# Patient Record
Sex: Female | Born: 1965 | Race: White | Hispanic: No | Marital: Married | Smoking: Never smoker
Health system: Southern US, Community
[De-identification: ages and names within clinical notes are randomized; demographics above are authoritative.]

## PROBLEM LIST (undated history)

## (undated) DIAGNOSIS — G4730 Sleep apnea, unspecified: Secondary | ICD-10-CM

## (undated) DIAGNOSIS — C569 Malignant neoplasm of unspecified ovary: Secondary | ICD-10-CM

## (undated) DIAGNOSIS — T7840XA Allergy, unspecified, initial encounter: Secondary | ICD-10-CM

## (undated) DIAGNOSIS — I509 Heart failure, unspecified: Secondary | ICD-10-CM

## (undated) DIAGNOSIS — I10 Essential (primary) hypertension: Secondary | ICD-10-CM

## (undated) DIAGNOSIS — N159 Renal tubulo-interstitial disease, unspecified: Secondary | ICD-10-CM

## (undated) HISTORY — PX: ABDOMINAL HYSTERECTOMY: SHX81

## (undated) HISTORY — DX: Essential (primary) hypertension: I10

## (undated) HISTORY — DX: Renal tubulo-interstitial disease, unspecified: N15.9

## (undated) HISTORY — DX: Malignant neoplasm of unspecified ovary: C56.9

## (undated) HISTORY — DX: Sleep apnea, unspecified: G47.30

## (undated) HISTORY — DX: Heart failure, unspecified: I50.9

## (undated) HISTORY — PX: EYE SURGERY: SHX253

## (undated) HISTORY — DX: Allergy, unspecified, initial encounter: T78.40XA

---

## 1986-11-28 HISTORY — PX: SHOULDER OPEN ROTATOR CUFF REPAIR: SHX2407

## 2000-04-03 ENCOUNTER — Other Ambulatory Visit: Admission: RE | Admit: 2000-04-03 | Discharge: 2000-04-03 | Payer: Self-pay | Admitting: *Deleted

## 2002-06-04 ENCOUNTER — Encounter: Admission: RE | Admit: 2002-06-04 | Discharge: 2002-09-02 | Payer: Self-pay | Admitting: Internal Medicine

## 2003-01-23 ENCOUNTER — Other Ambulatory Visit: Admission: RE | Admit: 2003-01-23 | Discharge: 2003-01-23 | Payer: Self-pay | Admitting: *Deleted

## 2005-07-26 ENCOUNTER — Other Ambulatory Visit: Admission: RE | Admit: 2005-07-26 | Discharge: 2005-07-26 | Payer: Self-pay | Admitting: Obstetrics & Gynecology

## 2009-06-30 ENCOUNTER — Encounter: Admission: RE | Admit: 2009-06-30 | Discharge: 2009-09-28 | Payer: Self-pay | Admitting: Obstetrics and Gynecology

## 2011-05-12 ENCOUNTER — Encounter (INDEPENDENT_AMBULATORY_CARE_PROVIDER_SITE_OTHER): Payer: Self-pay | Admitting: Surgery

## 2013-07-08 ENCOUNTER — Encounter: Payer: BC Managed Care – PPO | Attending: Internal Medicine | Admitting: Dietician

## 2013-07-08 ENCOUNTER — Ambulatory Visit: Payer: Self-pay | Admitting: Dietician

## 2013-07-08 ENCOUNTER — Encounter: Payer: Self-pay | Admitting: Dietician

## 2013-07-08 ENCOUNTER — Ambulatory Visit: Payer: Self-pay | Admitting: *Deleted

## 2013-07-08 VITALS — Ht 65.0 in | Wt 215.3 lb

## 2013-07-08 DIAGNOSIS — E119 Type 2 diabetes mellitus without complications: Secondary | ICD-10-CM | POA: Insufficient documentation

## 2013-07-08 DIAGNOSIS — Z713 Dietary counseling and surveillance: Secondary | ICD-10-CM | POA: Insufficient documentation

## 2013-07-08 NOTE — Progress Notes (Signed)
Appt start time: 1600 end time:  1715.   Assessment:  Patient was seen on  07/08/2013 for individual diabetes education. Markeita stated that she was interested in seeking some help to managed her blood sugar better. Reports that she has not been testing her blood sugar but can feel that it is fluctuating greatly and going low at times. Lives with her husband who was at the appointment with her today.   Previously had a very stressful job but is currently not working and feels like her blood sugar is going down now.   Anselmo Rod a glucose meter and instruction on how to test her sugar and recommended keeping a log which she can show the doctor in case an adjustment to medicine needs to be made.   Current HbA1c: 7.8 (02/2013)  MEDICATIONS: See list   DIETARY INTAKE:  24-hr recall:  B ( AM): oatmeal with coffee and sweetened creamer, or unsweetened cereal with skim milk , or English Muffins with margarine sometimes apple jelly sometimes with a diet drink  Snk ( AM): yogurt or boost glucose controlled shake (may have for breakfast), sometimes skipping  L ( PM): 1/2 ham sandwich with mustard, celery and carrots, 5-6 chips, and diet drink or peanut butter and jelly and sugar free bread Snk ( PM): boost glucose controlled shake or  gogurt  D ( PM): hot dog, hamburger, spaghetti, drive through, typically grilled chicken when have time, vegetable, greens, pork and beans, macaroni and cheese with diet drink or unsweet tea Snk ( PM): sometimes gogurt or peanut butter and crackers   Beverages: occassionally mini soda if sugar is low, usually diet drink, water, unsweet tea  Usual physical activity: 10 minute walk most days  Estimated energy needs: 1800 calories 200 g carbohydrates 135 g protein 50 g fat  Progress Towards Goal(s):  In progress.   Nutritional Diagnosis:  [place]-2.1 Impaired nutrition utilization As related to glucose metabolism. As evidenced by Hgb A1c 7.8% and doctor  recommendation for DSME.    Intervention:  Nutrition counseling provided.  Discussed diabetes disease process and treatment options.  Discussed physiology of diabetes and role of obesity on insulin resistance.    Encouraged moderate weight reduction to improve glucose levels.  Discussed role of medications and diet in glucose control  Provided education on macronutrients on glucose levels.  Provided education on carb counting, importance of regularly scheduled meals/snacks, and meal planning  Described short-term complications: hyper- and hypo-glycemia.  Discussed causes,symptoms, and treatment options.  Discussed role of stress on blood glucose levels and discussed strategies to manage psychosocial issues.  Not discussed d/t time constraints. Will address at follow-up:  Discussed prevention, detection, and treatment of long-term complications. Discussed the role of prolonged elevated glucose levels on body systems.  Discussed recommendations for long-term diabetes self-care. Established checklist for medical, dental, and emotional self-care.  Encouraged moderate weight reduction to improve glucose levels. Discussed role of medications and diet in glucose control  Discussed effects of physical activity on glucose levels and long-term glucose control. Recommended 150 minutes of physical activity/week. Reviewed patient medications.  Discussed role of medication on blood glucose and possible side effects Discussed blood glucose monitoring and interpretation.  Discussed recommended target ranges and individual ranges.     Handouts given during visit include:  Living With Diabetes Book  MyPlate Handout  16X CHO Snacks  Barriers to learning/adherance to lifestyle change: none  Diabetes self-care support plan:   [facility] support group  Husband and sons  Monitoring/Evaluation:  Dietary intake, exercise, test blood sugar, and body weight in 4 week(s).

## 2013-08-05 ENCOUNTER — Ambulatory Visit: Payer: BC Managed Care – PPO | Admitting: Dietician

## 2014-06-23 ENCOUNTER — Other Ambulatory Visit: Payer: Self-pay | Admitting: Nurse Practitioner

## 2014-06-23 ENCOUNTER — Ambulatory Visit
Admission: RE | Admit: 2014-06-23 | Discharge: 2014-06-23 | Disposition: A | Discharge status: Home / Self Care | Payer: BC Managed Care – PPO | Source: Ambulatory Visit | Attending: Nurse Practitioner | Admitting: Nurse Practitioner

## 2014-06-23 DIAGNOSIS — M25559 Pain in unspecified hip: Secondary | ICD-10-CM

## 2014-06-23 DIAGNOSIS — M25551 Pain in right hip: Secondary | ICD-10-CM

## 2016-03-23 ENCOUNTER — Other Ambulatory Visit: Payer: Self-pay | Admitting: Nurse Practitioner

## 2016-03-23 ENCOUNTER — Ambulatory Visit
Admission: RE | Admit: 2016-03-23 | Discharge: 2016-03-23 | Disposition: A | Discharge status: Home / Self Care | Payer: Managed Care, Other (non HMO) | Source: Ambulatory Visit | Attending: Nurse Practitioner | Admitting: Nurse Practitioner

## 2016-03-23 DIAGNOSIS — S90852A Superficial foreign body, left foot, initial encounter: Secondary | ICD-10-CM

## 2016-09-06 ENCOUNTER — Encounter (HOSPITAL_COMMUNITY): Payer: Self-pay | Admitting: Emergency Medicine

## 2016-09-06 ENCOUNTER — Emergency Department (HOSPITAL_COMMUNITY)
Admission: EM | Admit: 2016-09-06 | Discharge: 2016-09-07 | Disposition: A | Discharge status: Home / Self Care | Payer: Managed Care, Other (non HMO) | Attending: Emergency Medicine | Admitting: Emergency Medicine

## 2016-09-06 DIAGNOSIS — Z79899 Other long term (current) drug therapy: Secondary | ICD-10-CM | POA: Diagnosis not present

## 2016-09-06 DIAGNOSIS — I10 Essential (primary) hypertension: Secondary | ICD-10-CM | POA: Diagnosis not present

## 2016-09-06 DIAGNOSIS — E119 Type 2 diabetes mellitus without complications: Secondary | ICD-10-CM | POA: Diagnosis not present

## 2016-09-06 DIAGNOSIS — Z794 Long term (current) use of insulin: Secondary | ICD-10-CM | POA: Insufficient documentation

## 2016-09-06 DIAGNOSIS — Z91040 Latex allergy status: Secondary | ICD-10-CM | POA: Insufficient documentation

## 2016-09-06 DIAGNOSIS — Z7984 Long term (current) use of oral hypoglycemic drugs: Secondary | ICD-10-CM | POA: Insufficient documentation

## 2016-09-06 DIAGNOSIS — R109 Unspecified abdominal pain: Secondary | ICD-10-CM | POA: Diagnosis present

## 2016-09-06 DIAGNOSIS — N10 Acute tubulo-interstitial nephritis: Secondary | ICD-10-CM | POA: Insufficient documentation

## 2016-09-06 LAB — URINE MICROSCOPIC-ADD ON

## 2016-09-06 LAB — I-STAT CG4 LACTIC ACID, ED: Lactic Acid, Venous: 1.86 mmol/L (ref 0.5–1.9)

## 2016-09-06 LAB — COMPREHENSIVE METABOLIC PANEL
ALBUMIN: 3.5 g/dL (ref 3.5–5.0)
ALT: 25 U/L (ref 14–54)
AST: 27 U/L (ref 15–41)
Alkaline Phosphatase: 114 U/L (ref 38–126)
Anion gap: 15 (ref 5–15)
BUN: 35 mg/dL — AB (ref 6–20)
CO2: 19 mmol/L — AB (ref 22–32)
Calcium: 8.9 mg/dL (ref 8.9–10.3)
Chloride: 92 mmol/L — ABNORMAL LOW (ref 101–111)
Creatinine, Ser: 1.34 mg/dL — ABNORMAL HIGH (ref 0.44–1.00)
GFR calc Af Amer: 52 mL/min — ABNORMAL LOW (ref >60)
GFR calc non Af Amer: 45 mL/min — ABNORMAL LOW (ref >60)
GLUCOSE: 206 mg/dL — AB (ref 65–99)
POTASSIUM: 3.8 mmol/L (ref 3.5–5.1)
Sodium: 126 mmol/L — ABNORMAL LOW (ref 135–145)
Total Bilirubin: 0.7 mg/dL (ref 0.3–1.2)
Total Protein: 7.9 g/dL (ref 6.5–8.1)

## 2016-09-06 LAB — URINALYSIS, ROUTINE W REFLEX MICROSCOPIC
Bilirubin Urine: NEGATIVE
Ketones, ur: 40 mg/dL — AB
Nitrite: NEGATIVE
PH: 6 (ref 5.0–8.0)
Protein, ur: 100 mg/dL — AB
Specific Gravity, Urine: 1.026 (ref 1.005–1.030)

## 2016-09-06 LAB — CBC
HEMATOCRIT: 38.2 % (ref 36.0–46.0)
Hemoglobin: 13.4 g/dL (ref 12.0–15.0)
MCH: 29.4 pg (ref 26.0–34.0)
MCHC: 35.1 g/dL (ref 30.0–36.0)
MCV: 83.8 fL (ref 78.0–100.0)
Platelets: 198 10*3/uL (ref 150–400)
RBC: 4.56 MIL/uL (ref 3.87–5.11)
RDW: 12.8 % (ref 11.5–15.5)
WBC: 11.5 10*3/uL — AB (ref 4.0–10.5)

## 2016-09-06 LAB — LIPASE, BLOOD: LIPASE: 29 U/L (ref 11–51)

## 2016-09-06 MED ORDER — CIPROFLOXACIN HCL 500 MG PO TABS: 500.0000 mg | ORAL_TABLET | Freq: Two times a day (BID) | ORAL | 0 refills | Status: DC

## 2016-09-06 MED ORDER — DEXTROSE 5 % IV SOLN
1.0000 g | Freq: Once | INTRAVENOUS | Status: AC
  Administered 2016-09-06: 1 g via INTRAVENOUS
  Filled 2016-09-06: qty 10

## 2016-09-06 MED ORDER — ACETAMINOPHEN 325 MG PO TABS
650.0000 mg | ORAL_TABLET | Freq: Once | ORAL | Status: AC
  Administered 2016-09-06: 650 mg via ORAL
  Filled 2016-09-06: qty 2

## 2016-09-06 MED ORDER — SODIUM CHLORIDE 0.9 % IV BOLUS (SEPSIS)
1000.0000 mL | Freq: Once | INTRAVENOUS | Status: AC
  Administered 2016-09-06: 1000 mL via INTRAVENOUS

## 2016-09-06 MED ORDER — ONDANSETRON HCL 4 MG/2ML IJ SOLN
4.0000 mg | Freq: Once | INTRAMUSCULAR | Status: AC
  Administered 2016-09-06: 4 mg via INTRAVENOUS
  Filled 2016-09-06: qty 2

## 2016-09-06 MED ORDER — ONDANSETRON 4 MG PO TBDP
4.0000 mg | ORAL_TABLET | Freq: Once | ORAL | Status: AC | PRN
  Administered 2016-09-06: 4 mg via ORAL
  Filled 2016-09-06: qty 1

## 2016-09-06 MED ORDER — OXYCODONE-ACETAMINOPHEN 5-325 MG PO TABS: 1.0000 | ORAL_TABLET | Freq: Four times a day (QID) | ORAL | 0 refills | Status: DC | PRN

## 2016-09-06 MED ORDER — HYDROMORPHONE HCL 1 MG/ML IJ SOLN
1.0000 mg | Freq: Once | INTRAMUSCULAR | Status: AC
  Administered 2016-09-06: 1 mg via INTRAVENOUS
  Filled 2016-09-06: qty 1

## 2016-09-06 MED ORDER — ACETAMINOPHEN 325 MG PO TABS
650.0000 mg | ORAL_TABLET | Freq: Once | ORAL | Status: AC | PRN
  Administered 2016-09-06: 650 mg via ORAL
  Filled 2016-09-06: qty 2

## 2016-09-06 MED ORDER — ONDANSETRON 4 MG PO TBDP: 4.0000 mg | ORAL_TABLET | Freq: Three times a day (TID) | ORAL | 0 refills | Status: DC | PRN

## 2016-09-06 NOTE — Discharge Instructions (Signed)
Please read and follow all provided instructions.  Your diagnoses today include:  1. Acute pyelonephritis    Tests performed today include:  Urine test - suggests that you have an infection in your bladder  Blood counts and electrolytes - slightly high infection fighting cell count  Urine culture - pending  Vital signs. See below for your results today.   Medications prescribed:   Ciprofloxacin - antibiotic  You have been prescribed an antibiotic medicine: take the entire course of medicine even if you are feeling better. Stopping early can cause the antibiotic not to work.   Percocet (oxycodone/acetaminophen) - narcotic pain medication  DO NOT drive or perform any activities that require you to be awake and alert because this medicine can make you drowsy. BE VERY CAREFUL not to take multiple medicines containing Tylenol (also called acetaminophen). Doing so can lead to an overdose which can damage your liver and cause liver failure and possibly death.   Zofran (ondansetron) - for nausea and vomiting  Home care instructions:  Follow any educational materials contained in this packet.  Follow-up instructions: Please follow-up with your primary care provider in 2 days for recheck of your symptoms.  Return instructions:   Please return to the Emergency Department if you experience worsening symptoms.   Return with fever, worsening pain, persistent vomiting, worsening pain in your back.   Please return if you have any other emergent concerns.  Additional Information:  Your vital signs today were: BP 111/56 (BP Location: Right Arm)    Pulse 109    Temp 98.6 F (37 C) (Axillary)    Resp 20    Ht 5\' 6"  (1.676 m)    Wt 97.5 kg    SpO2 96%    BMI 34.70 kg/m  If your blood pressure (BP) was elevated above 135/85 this visit, please have this repeated by your doctor within one month. --------------

## 2016-09-06 NOTE — ED Triage Notes (Signed)
Patient reports dx with UTI 1 month ago. States she started getting sick again Friday. Complaining of bilateral flank pain and lower abdominal pain. Patient is on a new diabetic medication and states she thinks these symptoms might be due to that.

## 2016-09-06 NOTE — ED Provider Notes (Signed)
[facility] DEPT Provider Note   CSN: [id number] Arrival date & time: 09/06/16  1708   By signing my name below, I, Avnee Patel, attest that this documentation has been prepared under the direction and in the presence of  [employer]. Electronically Signed: Delton Prairie, ED Scribe. 09/06/16. 8:30 PM.   History   Chief Complaint CC: fever, vomiting, flank pain  The history is provided by the patient. No language interpreter was used.   HPI Comments:  Renee Howell is a 50 y.o. female, with a hx of DM, h/o pyelo treated with cipro a month ago, who presents to the Emergency Department complaining of gradual onset moderate right sided flank pain which began 5 days ago. Associated symptoms include fatigue, nausea, vomiting, fevers. Pt notes she visited her PCP x 5 day and was told she has hematuria. Pt notes her symptoms may be related to her new DM medications. She states she has felt similar symptoms every time she continues to take this DM medication. No alleviating factors noted.   Past Medical History:  Diagnosis Date  . Allergy   . Diabetes mellitus   . Hypertension   . Kidney infection     There are no active problems to display for this patient.   Past Surgical History:  Procedure Laterality Date  . ABDOMINAL HYSTERECTOMY    . CESAREAN SECTION    . EYE SURGERY    . SHOULDER OPEN ROTATOR CUFF REPAIR  1988    OB History    No data available       Home Medications    Prior to Admission medications   Medication Sig Start Date End Date Taking? Authorizing Provider  Calcium Citrate-Vitamin D (CITRACAL + D PO) Take 500 mg by mouth 2 (two) times daily.      Historical Provider, MD  citalopram (CELEXA) 20 MG tablet Take 20 mg by mouth daily.      Historical Provider, MD  glyBURIDE (DIABETA) 5 MG tablet Take 5 mg by mouth daily with breakfast.      Historical Provider, MD  ibuprofen (ADVIL,MOTRIN) 100 MG tablet Take 100 mg by mouth every 4 (four) hours as  needed.      Historical Provider, MD  metFORMIN (GLUMETZA) 1000 MG (MOD) 24 hr tablet Take 1,000 mg by mouth 2 (two) times daily with a meal.      Historical Provider, MD  Multiple Vitamins-Minerals (MULTIVITAMIN WITH MINERALS) tablet Take 1 tablet by mouth daily.      Historical Provider, MD  nystatin-triamcinolone (MYCOLOG) ointment Apply topically as directed.      Historical Provider, MD  sitaGLIPtin (JANUVIA) 100 MG tablet Take 100 mg by mouth daily.    Historical Provider, MD    Family History Family History  Problem Relation Age of Onset  . Cancer Mother   . Diabetes Father     Social History Social History  Substance Use Topics  . Smoking status: Never Smoker  . Smokeless tobacco: Never Used  . Alcohol use Yes     Allergies   Latex   Review of Systems Review of Systems  Constitutional: Positive for fatigue and fever.  HENT: Negative for rhinorrhea and sore throat.   Eyes: Negative for redness.  Respiratory: Negative for cough.   Cardiovascular: Negative for chest pain.  Gastrointestinal: Positive for nausea and vomiting. Negative for abdominal pain and diarrhea.  Genitourinary: Positive for flank pain. Negative for dysuria.  Musculoskeletal: Positive for myalgias.  Skin: Negative for rash.  Neurological: Negative for headaches.     Physical Exam Updated Vital Signs BP 105/80 (BP Location: Left Arm)   Pulse 119   Temp 102.3 F (39.1 C) (Oral)   Resp 16   Ht 5\' 6"  (1.676 m)   Wt 215 lb (97.5 kg)   SpO2 95%   BMI 34.70 kg/m   Physical Exam  Constitutional: She appears well-developed and well-nourished. She appears distressed.  HENT:  Head: Normocephalic and atraumatic.  Eyes: Conjunctivae are normal. Right eye exhibits no discharge. Left eye exhibits no discharge.  Neck: Normal range of motion. Neck supple.  Cardiovascular: Regular rhythm and normal heart sounds.  Tachycardia present.   Pulmonary/Chest: Effort normal and breath sounds normal. No  respiratory distress. She has no wheezes. She has no rales.  Abdominal: Soft. She exhibits no distension. There is no tenderness. There is CVA tenderness (Right).  Neurological: She is alert.  Skin: Skin is warm and dry.  Psychiatric: She has a normal mood and affect.  Nursing note and vitals reviewed.    ED Treatments / Results  DIAGNOSTIC STUDIES:  Oxygen Saturation is 95% on RA, normal by my interpretation.    COORDINATION OF CARE:  8:19 PM Will give pt a fluid bolus and Zofran. Discussed treatment plan with pt at bedside and pt agreed to plan.  Labs (all labs ordered are listed, but only abnormal results are displayed) Labs Reviewed  COMPREHENSIVE METABOLIC PANEL - Abnormal; Notable for the following:       Result Value   Sodium 126 (*)    Chloride 92 (*)    CO2 19 (*)    Glucose, Bld 206 (*)    BUN 35 (*)    Creatinine, Ser 1.34 (*)    GFR calc non Af Amer 45 (*)    GFR calc Af Amer 52 (*)    All other components within normal limits  CBC - Abnormal; Notable for the following:    WBC 11.5 (*)    All other components within normal limits  URINALYSIS, ROUTINE W REFLEX MICROSCOPIC (NOT AT [facility]) - Abnormal; Notable for the following:    APPearance CLOUDY (*)    Glucose, UA >1000 (*)    Hgb urine dipstick SMALL (*)    Ketones, ur 40 (*)    Protein, ur 100 (*)    Leukocytes, UA TRACE (*)    All other components within normal limits  URINE MICROSCOPIC-ADD ON - Abnormal; Notable for the following:    Squamous Epithelial / LPF 0-5 (*)    Bacteria, UA MANY (*)    All other components within normal limits  URINE CULTURE  LIPASE, BLOOD  I-STAT CG4 LACTIC ACID, ED   Procedures Procedures (including critical care time)  Medications Ordered in ED Medications  acetaminophen (TYLENOL) tablet 650 mg (650 mg Oral Given 09/06/16 1806)  ondansetron (ZOFRAN-ODT) disintegrating tablet 4 mg (4 mg Oral Given 09/06/16 1806)  HYDROmorphone (DILAUDID) injection 1 mg (1 mg  Intravenous Given 09/06/16 2109)  ondansetron (ZOFRAN) injection 4 mg (4 mg Intravenous Given 09/06/16 2109)  sodium chloride 0.9 % bolus 1,000 mL (0 mLs Intravenous Stopped 09/06/16 2211)  cefTRIAXone (ROCEPHIN) 1 g in dextrose 5 % 50 mL IVPB (0 g Intravenous Stopped 09/06/16 2224)  acetaminophen (TYLENOL) tablet 650 mg (650 mg Oral Given 09/06/16 2210)  sodium chloride 0.9 % bolus 1,000 mL (0 mLs Intravenous Stopped 09/06/16 2331)  HYDROmorphone (DILAUDID) injection 1 mg (1 mg Intravenous Given 09/06/16 2211)     Initial  Impression / Assessment and Plan / ED Course  I have reviewed the triage vital signs and the nursing notes.  Pertinent labs & imaging results that were available during my care of the patient were reviewed by me and considered in my medical decision making (see chart for details).  Clinical Course   Symptoms consistent with pyelo. She was hydrated with 2L NS. Pain was controlled and she was drinking fluids. Heart rate improved from 120s into the 100s. Fever improved.   Discussed and offered admission versus discharge to home with close PCP follow-up. Patient states that she is doing much better. Husband involved with decision at bedside. Patient will like to be discharged this time. Will discharged home with Cipro, Percocet, Zofran. Encouraged patient to follow-up with her primary care physician in the next 2 days for recheck. Encourage return with worsening symptoms including high persistent fever, persistent vomiting and unable to take her medications, uncontrolled pain. Patient has husband verbalized understanding and agrees with plan.  Final Clinical Impressions(s) / ED Diagnoses   Final diagnoses:  Acute pyelonephritis   Patient with signs and symptoms of acute pyelonephritis, likely right-sided. Patient febrile and tachycardic upon arrival. Treated as above with clinical improvement. Patient feeling much better and like to be discharged. She is tolerating fluids. No  signs of DKA. Feel that outpatient trial is warranted. Return instructions as above. Low suspicion for ureteral colic.   New Prescriptions Discharge Medication List as of 09/06/2016 11:53 PM    START taking these medications   Details  ciprofloxacin (CIPRO) 500 MG tablet Take 1 tablet (500 mg total) by mouth 2 (two) times daily., Starting Tue 09/06/2016, Print    ondansetron (ZOFRAN ODT) 4 MG disintegrating tablet Take 1 tablet (4 mg total) by mouth every 8 (eight) hours as needed for nausea or vomiting., Starting Tue 09/06/2016, Print    oxyCODONE-acetaminophen (PERCOCET/ROXICET) 5-325 MG tablet Take 1-2 tablets by mouth every 6 (six) hours as needed for severe pain., Starting Tue 09/06/2016, Print      I personally performed the services described in this documentation, which was scribed in my presence. The recorded information has been reviewed and is accurate.     Carlisle Cater, PA-C 09/07/16 [id number]    Gareth Morgan, MD 09/11/16 [id number]

## 2016-09-07 NOTE — ED Notes (Signed)
Pt ambulatory and independent at discharge.  Verbalized understanding of discharge instructions 

## 2016-09-09 ENCOUNTER — Emergency Department (HOSPITAL_COMMUNITY): Payer: Managed Care, Other (non HMO)

## 2016-09-09 ENCOUNTER — Emergency Department (HOSPITAL_COMMUNITY)
Admission: EM | Admit: 2016-09-09 | Discharge: 2016-09-09 | Disposition: A | Discharge status: Home / Self Care | Payer: Managed Care, Other (non HMO) | Attending: Emergency Medicine | Admitting: Emergency Medicine

## 2016-09-09 ENCOUNTER — Encounter (HOSPITAL_COMMUNITY): Payer: Self-pay | Admitting: *Deleted

## 2016-09-09 DIAGNOSIS — Z7984 Long term (current) use of oral hypoglycemic drugs: Secondary | ICD-10-CM | POA: Diagnosis not present

## 2016-09-09 DIAGNOSIS — R109 Unspecified abdominal pain: Secondary | ICD-10-CM | POA: Insufficient documentation

## 2016-09-09 DIAGNOSIS — E119 Type 2 diabetes mellitus without complications: Secondary | ICD-10-CM | POA: Insufficient documentation

## 2016-09-09 DIAGNOSIS — I10 Essential (primary) hypertension: Secondary | ICD-10-CM | POA: Insufficient documentation

## 2016-09-09 DIAGNOSIS — Z791 Long term (current) use of non-steroidal anti-inflammatories (NSAID): Secondary | ICD-10-CM | POA: Insufficient documentation

## 2016-09-09 DIAGNOSIS — R1084 Generalized abdominal pain: Secondary | ICD-10-CM

## 2016-09-09 DIAGNOSIS — Z87440 Personal history of urinary (tract) infections: Secondary | ICD-10-CM

## 2016-09-09 DIAGNOSIS — Z79899 Other long term (current) drug therapy: Secondary | ICD-10-CM | POA: Insufficient documentation

## 2016-09-09 LAB — BASIC METABOLIC PANEL
Anion gap: 10 (ref 5–15)
BUN: 13 mg/dL (ref 6–20)
CO2: 22 mmol/L (ref 22–32)
Calcium: 8.8 mg/dL — ABNORMAL LOW (ref 8.9–10.3)
Chloride: 106 mmol/L (ref 101–111)
Creatinine, Ser: 0.85 mg/dL (ref 0.44–1.00)
GFR calc Af Amer: >60 mL/min (ref >60)
GFR calc non Af Amer: >60 mL/min (ref >60)
Glucose, Bld: 76 mg/dL (ref 65–99)
Potassium: 3.2 mmol/L — ABNORMAL LOW (ref 3.5–5.1)
Sodium: 138 mmol/L (ref 135–145)

## 2016-09-09 LAB — URINALYSIS, ROUTINE W REFLEX MICROSCOPIC
BILIRUBIN URINE: NEGATIVE
Glucose, UA: >1000 mg/dL — AB
Ketones, ur: NEGATIVE mg/dL
Leukocytes, UA: NEGATIVE
NITRITE: NEGATIVE
Protein, ur: NEGATIVE mg/dL
Specific Gravity, Urine: 1.021 (ref 1.005–1.030)
pH: 6 (ref 5.0–8.0)

## 2016-09-09 LAB — URINE MICROSCOPIC-ADD ON

## 2016-09-09 LAB — CBC WITH DIFFERENTIAL/PLATELET
Basophils Absolute: 0 10*3/uL (ref 0.0–0.1)
Basophils Relative: 0 %
Eosinophils Absolute: 0 10*3/uL (ref 0.0–0.7)
Eosinophils Relative: 0 %
HCT: 37.7 % (ref 36.0–46.0)
Hemoglobin: 13.1 g/dL (ref 12.0–15.0)
Lymphocytes Relative: 19 %
Lymphs Abs: 2.6 10*3/uL (ref 0.7–4.0)
MCH: 29.5 pg (ref 26.0–34.0)
MCHC: 34.7 g/dL (ref 30.0–36.0)
MCV: 84.9 fL (ref 78.0–100.0)
Monocytes Absolute: 1.3 10*3/uL — ABNORMAL HIGH (ref 0.1–1.0)
Monocytes Relative: 10 %
Neutro Abs: 9.6 10*3/uL — ABNORMAL HIGH (ref 1.7–7.7)
Neutrophils Relative %: 71 %
Platelets: 298 10*3/uL (ref 150–400)
RBC: 4.44 MIL/uL (ref 3.87–5.11)
RDW: 13.7 % (ref 11.5–15.5)
WBC: 13.5 10*3/uL — ABNORMAL HIGH (ref 4.0–10.5)

## 2016-09-09 LAB — HEPATIC FUNCTION PANEL
ALT: 62 U/L — ABNORMAL HIGH (ref 14–54)
AST: 43 U/L — ABNORMAL HIGH (ref 15–41)
Albumin: 3.1 g/dL — ABNORMAL LOW (ref 3.5–5.0)
Alkaline Phosphatase: 170 U/L — ABNORMAL HIGH (ref 38–126)
Bilirubin, Direct: 0.1 mg/dL (ref 0.1–0.5)
Indirect Bilirubin: 0.6 mg/dL (ref 0.3–0.9)
Total Bilirubin: 0.7 mg/dL (ref 0.3–1.2)
Total Protein: 7.5 g/dL (ref 6.5–8.1)

## 2016-09-09 LAB — URINE CULTURE: Culture: >=100000 — AB

## 2016-09-09 LAB — I-STAT CG4 LACTIC ACID, ED: Lactic Acid, Venous: 1.01 mmol/L (ref 0.5–1.9)

## 2016-09-09 MED ORDER — IOPAMIDOL (ISOVUE-300) INJECTION 61%
75.0000 mL | Freq: Once | INTRAVENOUS | Status: AC | PRN
  Administered 2016-09-09: 75 mL via INTRAVENOUS

## 2016-09-09 MED ORDER — SODIUM CHLORIDE 0.9 % IV BOLUS (SEPSIS)
1000.0000 mL | Freq: Once | INTRAVENOUS | Status: AC
  Administered 2016-09-09: 1000 mL via INTRAVENOUS

## 2016-09-09 MED ORDER — MORPHINE SULFATE (PF) 4 MG/ML IV SOLN
4.0000 mg | Freq: Once | INTRAVENOUS | Status: AC
Start: 2016-09-09 — End: 2016-09-09
  Administered 2016-09-09: 4 mg via INTRAVENOUS
  Filled 2016-09-09: qty 1

## 2016-09-09 MED ORDER — FLUCONAZOLE 150 MG PO TABS: 150.0000 mg | ORAL_TABLET | Freq: Once | ORAL | 0 refills | Status: AC

## 2016-09-09 MED ORDER — IOPAMIDOL (ISOVUE-370) INJECTION 76%: 75.0000 mL | Freq: Once | INTRAVENOUS | Status: DC | PRN

## 2016-09-09 MED ORDER — ONDANSETRON 4 MG PO TBDP
4.0000 mg | ORAL_TABLET | Freq: Once | ORAL | Status: AC | PRN
  Administered 2016-09-09: 4 mg via ORAL
  Filled 2016-09-09: qty 1

## 2016-09-09 NOTE — ED Provider Notes (Signed)
[facility] DEPT Provider Note   CSN: [id number] Arrival date & time: 09/09/16  1349     History   Chief Complaint Chief Complaint  Patient presents with  . Flank Pain  . Fatigue    HPI Renee Howell is a 50 y.o. female.  HPI   50 year old female presents today with complaints of abdominal pain. Patient has a past medical history of diabetes and recent pyelonephritis. One month ago she was treated with Cipro, notes approximately 7 days ago she started having moderate right-sided flank pain. She reports nausea vomiting and subjective fevers at home. Patient originally thought this was due to her diabetic medications after seeing her primary care provider. Patient was seen in the emergency room 2 days ago had urinalysis consistent with urinary tract infection and right-sided flank pain. Patient was diagnosed with pyelonephritis. She was discharged home and reports that her symptoms return. She reports she is having more abdominal pain now, diffuse, with very little flank pain at this time. She denies any significant dysuria, reports that she continues to be nauseous and vomiting. Patient notes that she hadn't had a bowel movement in 3 days, had to use an enema last night and had a painful bowel movement. She notes since then she's been passing significant gas.   Past Medical History:  Diagnosis Date  . Allergy   . Diabetes mellitus   . Hypertension   . Kidney infection     There are no active problems to display for this patient.   Past Surgical History:  Procedure Laterality Date  . ABDOMINAL HYSTERECTOMY    . CESAREAN SECTION    . EYE SURGERY    . SHOULDER OPEN ROTATOR CUFF REPAIR  1988    OB History    No data available       Home Medications    Prior to Admission medications   Medication Sig Start Date End Date Taking? Authorizing Provider  acidophilus (RISAQUAD) CAPS capsule Take 1 capsule by mouth daily.   Yes Historical Provider, MD    amphetamine-dextroamphetamine (ADDERALL XR) 20 MG 24 hr capsule Take 20 mg by mouth daily.   Yes Historical Provider, MD  amphetamine-dextroamphetamine (ADDERALL) 10 MG tablet Take 10 mg by mouth daily with breakfast.   Yes Historical Provider, MD  canagliflozin (INVOKANA) 100 MG TABS tablet Take 100 mg by mouth daily before breakfast.   Yes Historical Provider, MD  cholecalciferol (VITAMIN D) 1000 units tablet Take 2,000 Units by mouth daily.   Yes Historical Provider, MD  ciprofloxacin (CIPRO) 500 MG tablet Take 1 tablet (500 mg total) by mouth 2 (two) times daily. 09/06/16  Yes Carlisle Cater, PA-C  Dulaglutide (TRULICITY [place]) Inject 1 each into the skin every 7 (seven) days.   Yes Historical Provider, MD  DULoxetine (CYMBALTA) 30 MG capsule Take 90 mg by mouth daily.   Yes Historical Provider, MD  glyBURIDE (DIABETA) 5 MG tablet Take 5 mg by mouth 2 (two) times daily.    Yes Historical Provider, MD  ibuprofen (ADVIL,MOTRIN) 100 MG tablet Take 100 mg by mouth every 4 (four) hours as needed for pain.    Yes Historical Provider, MD  insulin detemir (LEVEMIR) 100 UNIT/ML injection Inject 15 Units into the skin 2 (two) times daily.   Yes Historical Provider, MD  metFORMIN (GLUCOPHAGE) 1000 MG tablet Take 1,000 mg by mouth 2 (two) times daily.   Yes Historical Provider, MD  ondansetron (ZOFRAN ODT) 4 MG disintegrating tablet Take 1 tablet (4 mg total)  by mouth every 8 (eight) hours as needed for nausea or vomiting. 09/06/16  Yes Carlisle Cater, PA-C  oxyCODONE-acetaminophen (PERCOCET/ROXICET) 5-325 MG tablet Take 1-2 tablets by mouth every 6 (six) hours as needed for severe pain. Patient not taking: Reported on 09/09/2016 09/06/16   Carlisle Cater, PA-C    Family History Family History  Problem Relation Age of Onset  . Cancer Mother   . Diabetes Father     Social History Social History  Substance Use Topics  . Smoking status: Never Smoker  . Smokeless tobacco: Never Used  . Alcohol use Yes      Allergies   Trulicity [dulaglutide] and Latex   Review of Systems Review of Systems  All other systems reviewed and are negative.  Physical Exam Updated Vital Signs BP 140/74 (BP Location: Right Arm)   Pulse 113   Temp 98.2 F (36.8 C) (Oral)   Resp 18   SpO2 100%   Physical Exam  Constitutional: She is oriented to person, place, and time. She appears well-developed and well-nourished.  HENT:  Head: Normocephalic and atraumatic.  Eyes: Conjunctivae are normal. Pupils are equal, round, and reactive to light. Right eye exhibits no discharge. Left eye exhibits no discharge. No scleral icterus.  Neck: Normal range of motion. No JVD present. No tracheal deviation present.  Pulmonary/Chest: Effort normal. No stridor.  Abdominal: Soft. Bowel sounds are normal. She exhibits no distension and no mass. There is tenderness. There is no rebound and no guarding.  RUQ TTP, mild generalized abd TTP  Neurological: She is alert and oriented to person, place, and time. Coordination normal.  Psychiatric: She has a normal mood and affect. Her behavior is normal. Judgment and thought content normal.  Nursing note and vitals reviewed.    ED Treatments / Results  Labs (all labs ordered are listed, but only abnormal results are displayed) Labs Reviewed  URINALYSIS, ROUTINE W REFLEX MICROSCOPIC (NOT AT [facility]) - Abnormal; Notable for the following:       Result Value   Glucose, UA >1000 (*)    Hgb urine dipstick TRACE (*)    All other components within normal limits  URINE MICROSCOPIC-ADD ON - Abnormal; Notable for the following:    Squamous Epithelial / LPF 0-5 (*)    Bacteria, UA RARE (*)    All other components within normal limits  CBC WITH DIFFERENTIAL/PLATELET - Abnormal; Notable for the following:    WBC 13.5 (*)    Neutro Abs 9.6 (*)    Monocytes Absolute 1.3 (*)    All other components within normal limits  BASIC METABOLIC PANEL - Abnormal; Notable for the following:     Potassium 3.2 (*)    Calcium 8.8 (*)    All other components within normal limits  HEPATIC FUNCTION PANEL  I-STAT CG4 LACTIC ACID, ED    EKG  EKG Interpretation None       Radiology Ct Abdomen Pelvis W Contrast  Result Date: 09/09/2016 CLINICAL DATA:  Lower abdominal pain EXAM: CT ABDOMEN AND PELVIS WITH CONTRAST TECHNIQUE: Multidetector CT imaging of the abdomen and pelvis was performed using the standard protocol following bolus administration of intravenous contrast. CONTRAST:  55mL ISOVUE-300 IOPAMIDOL (ISOVUE-300) INJECTION 61% COMPARISON:  None. FINDINGS: Lower chest: Small patchy area of atelectasis or infiltrate right medial lung base, incompletely evaluated. Lung bases otherwise clear. Hepatobiliary: Liver normal in size and contour. No focal liver lesion. Gallbladder and bile ducts normal. Pancreas: Negative Spleen: Negative Adrenals/Urinary Tract: Normal renal excretion of  contrast. No mass or obstruction. No urinary tract calculi. Urinary bladder normal. Stomach/Bowel: Negative for bowel obstruction. No bowel mass or edema. Normal appendix. Moderate stool in the colon. Negative for diverticulitis. Vascular/Lymphatic: Negative Reproductive: Hysterectomy.  No pelvic mass. Other: No free fluid.  Small umbilical hernia containing fat. Musculoskeletal: Negative IMPRESSION: Small area of atelectasis or infiltrate right medial lung base Constipation. Negative for acute abnormality in the abdomen. Negative for diverticulitis or appendicitis. Electronically Signed   By: Franchot Gallo M.D.   On: 09/09/2016 21:15    Procedures Procedures (including critical care time)  Medications Ordered in ED Medications  sodium chloride 0.9 % bolus 1,000 mL (not administered)  iopamidol (ISOVUE-370) 76 % injection 75 mL (not administered)  ondansetron (ZOFRAN-ODT) disintegrating tablet 4 mg (4 mg Oral Given 09/09/16 1612)  morphine 4 MG/ML injection 4 mg (4 mg Intravenous Given 09/09/16 2008)    iopamidol (ISOVUE-300) 61 % injection 75 mL (75 mLs Intravenous Contrast Given 09/09/16 2051)     Initial Impression / Assessment and Plan / ED Course  I have reviewed the triage vital signs and the nursing notes.  Pertinent labs & imaging results that were available during my care of the patient were reviewed by me and considered in my medical decision making (see chart for details).  Clinical Course     Final Clinical Impressions(s) / ED Diagnoses   Final diagnoses:  Generalized abdominal pain    Labs: CBC, BMP, urinalysis  Imaging: CT abdomen and pelvis with contrast  Consults:  Therapeutics:  Discharge Meds:   Assessment/Plan:  50 year old female presents today with complaints of abdominal pain. Patient originally had urinary tract infection diagnosed with pyelonephritis. Symptoms have migrated to the abdomen, very minimal right flank pain, urinalysis not consistent with UTI. Patient will require CT abdomen and pelvis due to significant pain.  Initial labs show elevated white blood cells  Pt care signed out to oncoming provider pending  CT, reevaluation and disposition.     New Prescriptions New Prescriptions   No medications on file     Okey Regal, PA-C 09/09/16 2124    Isla Pence, MD 09/10/16 1807

## 2016-09-09 NOTE — ED Triage Notes (Signed)
Pt complains of fatigue, bilateral flank pain. Pt was seen at [facility] 2 days ago and diagnosed with pyelonephritis, sent home with cipro and told to follow up with PCP. Pt spoke to PCP over phone today, who recommended pt come to ED. Pt also complains of red patchiness to her lower legs bilateral since last night.

## 2016-09-09 NOTE — ED Provider Notes (Signed)
11:30 PM Patient care assumed from [facility] PA-C at change of shift. Patient presented 3 days ago for complaints of flank pain. She was diagnosed with a urinary tract infection at this time. She returned to the ED for worsening right flank pain. On my assessment of the patient, she states that she feels much better.  CT abdomen pelvis was ordered by Hedges, PA-C, given persistent discomfort. I have reviewed these findings which are significant for constipation. No acute process in the abdomen or pelvis. Patient does have some mild findings in her medial right lower lobe concerning for early infiltrate versus atelectasis. I have a low suspicion for pneumonia at this time as patient is afebrile and without respiratory symptoms. She denies any shortness of breath. No pleuritic type chest pain. She has no hyperglycemia or DKA to suggest acute infection. She does have a leukocytosis; however, this is stable. Lactic acid level is normal.  Patient has been advised to continue her antibiotics and follow-up with her doctor on Monday for repeat evaluation. I have discussed reasons to return to the emergency department. The patient verbalizes understanding. Return precautions provided as well. Patient discharged in stable condition with no unaddressed concerns.   Antonietta Breach, PA-C 09/10/16 [id number]    Isla Pence, MD 09/10/16 [phone]

## 2016-09-09 NOTE — ED Notes (Signed)
Patient transported to X-ray 

## 2016-09-09 NOTE — Discharge Instructions (Signed)
Your CT scan showed constipation. We recommend that you continue taking MiraLAX twice a day for this. Your CT also showed possibility of very early pneumonia, though we believe it is more likely that this is something called atelectasis which can be a normal finding on CT scan and resolves on its own in a few days. You may take your home pain medications as needed. Continue your antibiotics until completed. You may take Diflucan upon completion of your antibiotic course. Follow-up with your doctor on Monday or Tuesday regarding your visit today. You may return for any new or concerning symptoms.

## 2016-09-09 NOTE — ED Notes (Signed)
About to draw blood for I-stat lactic, pt.'s husband requested phlebotomist to draw the blood instead of this Nurse. Mini lab notified.

## 2016-09-10 ENCOUNTER — Telehealth (HOSPITAL_BASED_OUTPATIENT_CLINIC_OR_DEPARTMENT_OTHER): Payer: Self-pay

## 2016-09-10 NOTE — Telephone Encounter (Signed)
Post ED Visit - Positive Culture Follow-up  Culture report reviewed by antimicrobial stewardship pharmacist:  []  Elenor Quinones, Pharm.D. []  Heide Guile, Pharm.D., BCPS []  Parks Neptune, Pharm.D. []  Alycia Rossetti, Pharm.D., BCPS []  [place], Pharm.D., BCPS, AAHIVP []  Legrand Como, Pharm.D., BCPS, AAHIVP []  Milus Glazier, Pharm.D. []  Rob Evette Doffing, Pharm.D. [facility] D Positive urine culture Treated with Ciprofloxacin, organism sensitive to the same and no further patient follow-up is required at this time.  Genia Del 09/10/2016, 12:17 PM

## 2016-12-07 ENCOUNTER — Encounter (HOSPITAL_BASED_OUTPATIENT_CLINIC_OR_DEPARTMENT_OTHER): Payer: Self-pay

## 2016-12-07 ENCOUNTER — Inpatient Hospital Stay (HOSPITAL_BASED_OUTPATIENT_CLINIC_OR_DEPARTMENT_OTHER)
Admission: EM | Admit: 2016-12-07 | Discharge: 2016-12-09 | DRG: 872 | Disposition: A | Discharge status: Home / Self Care | Payer: Managed Care, Other (non HMO) | Attending: Internal Medicine | Admitting: Internal Medicine

## 2016-12-07 DIAGNOSIS — Z79899 Other long term (current) drug therapy: Secondary | ICD-10-CM

## 2016-12-07 DIAGNOSIS — E119 Type 2 diabetes mellitus without complications: Secondary | ICD-10-CM | POA: Diagnosis not present

## 2016-12-07 DIAGNOSIS — E860 Dehydration: Secondary | ICD-10-CM | POA: Diagnosis present

## 2016-12-07 DIAGNOSIS — L03114 Cellulitis of left upper limb: Secondary | ICD-10-CM | POA: Diagnosis not present

## 2016-12-07 DIAGNOSIS — A419 Sepsis, unspecified organism: Principal | ICD-10-CM | POA: Diagnosis present

## 2016-12-07 DIAGNOSIS — I10 Essential (primary) hypertension: Secondary | ICD-10-CM | POA: Diagnosis present

## 2016-12-07 DIAGNOSIS — R21 Rash and other nonspecific skin eruption: Secondary | ICD-10-CM | POA: Diagnosis not present

## 2016-12-07 DIAGNOSIS — E876 Hypokalemia: Secondary | ICD-10-CM | POA: Diagnosis present

## 2016-12-07 DIAGNOSIS — Z91040 Latex allergy status: Secondary | ICD-10-CM

## 2016-12-07 DIAGNOSIS — L0390 Cellulitis, unspecified: Secondary | ICD-10-CM | POA: Diagnosis present

## 2016-12-07 DIAGNOSIS — R000 Tachycardia, unspecified: Secondary | ICD-10-CM | POA: Diagnosis present

## 2016-12-07 DIAGNOSIS — Z794 Long term (current) use of insulin: Secondary | ICD-10-CM

## 2016-12-07 DIAGNOSIS — Z888 Allergy status to other drugs, medicaments and biological substances status: Secondary | ICD-10-CM

## 2016-12-07 LAB — CBC WITH DIFFERENTIAL/PLATELET
Basophils Absolute: 0 10*3/uL (ref 0.0–0.1)
Basophils Relative: 0 %
EOS ABS: 0.1 10*3/uL (ref 0.0–0.7)
EOS PCT: 1 %
HCT: 41.8 % (ref 36.0–46.0)
Hemoglobin: 14 g/dL (ref 12.0–15.0)
LYMPHS ABS: 2.3 10*3/uL (ref 0.7–4.0)
Lymphocytes Relative: 17 %
MCH: 29.4 pg (ref 26.0–34.0)
MCHC: 33.5 g/dL (ref 30.0–36.0)
MCV: 87.6 fL (ref 78.0–100.0)
MONOS PCT: 9 %
Monocytes Absolute: 1.2 10*3/uL — ABNORMAL HIGH (ref 0.1–1.0)
Neutro Abs: 9.8 10*3/uL — ABNORMAL HIGH (ref 1.7–7.7)
Neutrophils Relative %: 73 %
PLATELETS: 269 10*3/uL (ref 150–400)
RBC: 4.77 MIL/uL (ref 3.87–5.11)
RDW: 12.7 % (ref 11.5–15.5)
WBC: 13.4 10*3/uL — AB (ref 4.0–10.5)

## 2016-12-07 LAB — GLUCOSE, CAPILLARY: Glucose-Capillary: 86 mg/dL (ref 65–99)

## 2016-12-07 LAB — I-STAT CG4 LACTIC ACID, ED: LACTIC ACID, VENOUS: 1.15 mmol/L (ref 0.5–1.9)

## 2016-12-07 LAB — BASIC METABOLIC PANEL
Anion gap: 9 (ref 5–15)
BUN: 21 mg/dL — AB (ref 6–20)
CO2: 25 mmol/L (ref 22–32)
CREATININE: 1.02 mg/dL — AB (ref 0.44–1.00)
Calcium: 9.7 mg/dL (ref 8.9–10.3)
Chloride: 102 mmol/L (ref 101–111)
GFR calc Af Amer: >60 mL/min (ref >60)
Glucose, Bld: 146 mg/dL — ABNORMAL HIGH (ref 65–99)
Potassium: 3.5 mmol/L (ref 3.5–5.1)
SODIUM: 136 mmol/L (ref 135–145)

## 2016-12-07 LAB — LACTIC ACID, PLASMA: LACTIC ACID, VENOUS: 1.5 mmol/L (ref 0.5–1.9)

## 2016-12-07 MED ORDER — INSULIN DETEMIR 100 UNIT/ML ~~LOC~~ SOLN
35.0000 [IU] | Freq: Two times a day (BID) | SUBCUTANEOUS | Status: DC
  Filled 2016-12-07: qty 0.35

## 2016-12-07 MED ORDER — SODIUM CHLORIDE 0.9 % IV SOLN
INTRAVENOUS | Status: DC
  Administered 2016-12-07 – 2016-12-09 (×3): via INTRAVENOUS

## 2016-12-07 MED ORDER — SODIUM CHLORIDE 0.9 % IV BOLUS (SEPSIS): 1000.0000 mL | Freq: Once | INTRAVENOUS | Status: DC

## 2016-12-07 MED ORDER — ACETAMINOPHEN 325 MG PO TABS: 650.0000 mg | ORAL_TABLET | Freq: Four times a day (QID) | ORAL | Status: DC | PRN

## 2016-12-07 MED ORDER — ENOXAPARIN SODIUM 40 MG/0.4ML ~~LOC~~ SOLN
40.0000 mg | SUBCUTANEOUS | Status: DC
  Administered 2016-12-07 – 2016-12-08 (×2): 40 mg via SUBCUTANEOUS
  Filled 2016-12-07 (×3): qty 0.4

## 2016-12-07 MED ORDER — ACETAMINOPHEN 650 MG RE SUPP: 650.0000 mg | Freq: Four times a day (QID) | RECTAL | Status: DC | PRN

## 2016-12-07 MED ORDER — SODIUM CHLORIDE 0.9 % IV BOLUS (SEPSIS)
1000.0000 mL | Freq: Once | INTRAVENOUS | Status: AC
  Administered 2016-12-07: 1000 mL via INTRAVENOUS

## 2016-12-07 MED ORDER — ONDANSETRON HCL 4 MG/2ML IJ SOLN: 4.0000 mg | Freq: Four times a day (QID) | INTRAMUSCULAR | Status: DC | PRN

## 2016-12-07 MED ORDER — ACETAMINOPHEN 325 MG PO TABS
650.0000 mg | ORAL_TABLET | Freq: Once | ORAL | Status: AC
  Administered 2016-12-07: 650 mg via ORAL
  Filled 2016-12-07: qty 2

## 2016-12-07 MED ORDER — VANCOMYCIN HCL IN DEXTROSE 1-5 GM/200ML-% IV SOLN
1000.0000 mg | Freq: Once | INTRAVENOUS | Status: AC
  Administered 2016-12-07: 1000 mg via INTRAVENOUS
  Filled 2016-12-07: qty 200

## 2016-12-07 MED ORDER — ONDANSETRON HCL 4 MG PO TABS: 4.0000 mg | ORAL_TABLET | Freq: Four times a day (QID) | ORAL | Status: DC | PRN

## 2016-12-07 MED ORDER — INSULIN ASPART 100 UNIT/ML ~~LOC~~ SOLN
0.0000 [IU] | Freq: Three times a day (TID) | SUBCUTANEOUS | Status: DC
  Administered 2016-12-08: 11 [IU] via SUBCUTANEOUS
  Administered 2016-12-08 (×2): 5 [IU] via SUBCUTANEOUS
  Administered 2016-12-09: 3 [IU] via SUBCUTANEOUS

## 2016-12-07 MED ORDER — INSULIN DETEMIR 100 UNIT/ML ~~LOC~~ SOLN
15.0000 [IU] | Freq: Two times a day (BID) | SUBCUTANEOUS | Status: DC
  Administered 2016-12-08 (×2): 15 [IU] via SUBCUTANEOUS
  Filled 2016-12-07 (×4): qty 0.15

## 2016-12-07 MED ORDER — VANCOMYCIN HCL IN DEXTROSE 1-5 GM/200ML-% IV SOLN
1000.0000 mg | Freq: Two times a day (BID) | INTRAVENOUS | Status: DC
  Administered 2016-12-08 – 2016-12-09 (×2): 1000 mg via INTRAVENOUS
  Filled 2016-12-07 (×3): qty 200

## 2016-12-07 MED ORDER — HYDROCODONE-ACETAMINOPHEN 5-325 MG PO TABS
1.0000 | ORAL_TABLET | ORAL | Status: DC | PRN
  Administered 2016-12-07 – 2016-12-08 (×2): 1 via ORAL
  Administered 2016-12-08 – 2016-12-09 (×3): 2 via ORAL
  Filled 2016-12-07 (×2): qty 2
  Filled 2016-12-07: qty 1
  Filled 2016-12-07: qty 2
  Filled 2016-12-07: qty 1

## 2016-12-07 MED ORDER — ONDANSETRON HCL 4 MG/2ML IJ SOLN
4.0000 mg | Freq: Once | INTRAMUSCULAR | Status: AC
  Administered 2016-12-07: 4 mg via INTRAVENOUS
  Filled 2016-12-07: qty 2

## 2016-12-07 MED ORDER — NYSTATIN 100000 UNIT/ML MT SUSP: 5.0000 mL | Freq: Four times a day (QID) | OROMUCOSAL | Status: DC | PRN

## 2016-12-07 MED ORDER — INSULIN ASPART 100 UNIT/ML ~~LOC~~ SOLN
0.0000 [IU] | Freq: Every day | SUBCUTANEOUS | Status: DC
  Administered 2016-12-08: 5 [IU] via SUBCUTANEOUS

## 2016-12-07 MED ORDER — FENTANYL CITRATE (PF) 100 MCG/2ML IJ SOLN
50.0000 ug | Freq: Once | INTRAMUSCULAR | Status: AC
  Administered 2016-12-07: 50 ug via INTRAVENOUS
  Filled 2016-12-07: qty 2

## 2016-12-07 MED ORDER — DULOXETINE HCL 60 MG PO CPEP
90.0000 mg | ORAL_CAPSULE | Freq: Every day | ORAL | Status: DC
  Administered 2016-12-08 – 2016-12-09 (×2): 90 mg via ORAL
  Filled 2016-12-07 (×3): qty 1

## 2016-12-07 MED ORDER — ATORVASTATIN CALCIUM 10 MG PO TABS
10.0000 mg | ORAL_TABLET | Freq: Every day | ORAL | Status: DC
Start: 2016-12-08 — End: 2016-12-09
  Administered 2016-12-08: 10 mg via ORAL
  Filled 2016-12-07: qty 1

## 2016-12-07 MED ORDER — DEXTROSE 5 % IV SOLN
2.0000 g | INTRAVENOUS | Status: DC
  Administered 2016-12-07 – 2016-12-08 (×2): 2 g via INTRAVENOUS
  Filled 2016-12-07 (×2): qty 2

## 2016-12-07 NOTE — Progress Notes (Signed)
Pharmacy Antibiotic Note  Renee Howell is a 51 y.o. female admitted on 12/07/2016 with cellulitis progressing on oral keflex since Monday.  Pharmacy has been consulted for vancomycin dosing, received 1g dose at [facility] ~6PM. MD is dosing ceftriaxone.  Plan:  Vancomycin 1g IV q12h  Check trough at steady state, goal 10-15 mcg/ml  Follow up renal function & cultures, de-escalate as appropriate  Height: 5\' 6"  (167.6 cm) Weight: 199 lb 14.4 oz (90.7 kg) IBW/kg (Calculated) : 59.3  Temp (24hrs), Avg:98.4 F (36.9 C), Min:97.9 F (36.6 C), Max:98.8 F (37.1 C)   Recent Labs Lab 12/07/16 1758 12/07/16 1800  WBC  --  13.4*  CREATININE  --  1.02*  LATICACIDVEN 1.15  --     Estimated Creatinine Clearance: 73.2 mL/min (by C-G formula based on SCr of 1.02 mg/dL (H)).    Allergies  Allergen Reactions  . Trulicity [Dulaglutide] Other (See Comments)    Pt states that the trulicity makes her feel horrible three days after she uses it. States she will never take it again.   . Latex Rash    Antimicrobials this admission:  1/10 Rocephin >> 1/10 Vanc >>  Dose adjustments this admission:  ---  Microbiology results:  1/10 BCx: sent  Thank you for allowing pharmacy to be a part of this patient's care.  Peggyann Juba, PharmD, BCPS Pager: [phone] 12/07/2016 10:00 PM

## 2016-12-07 NOTE — ED Triage Notes (Signed)
C/o bump to left UE Saturday-seen PCP Monday-started on abx-PCP sent pt to ED due to redness outside drawn lines-NAD-steady gait

## 2016-12-07 NOTE — H&P (Signed)
History and Physical    Renee Howell [id number] DOB: 1966-02-02 DOA: 12/07/2016  PCP: Leotis Pain, DO   Patient coming from: [facility]  Chief Complaint: Progressive left arm pain and swelling with decreased ROM at left elbow.  HPI: Renee Howell is a 51 y.o. woman with a history of HTN and Insulin dependent diabetes who presented to the ED in [facility] for evaluation of progressive pain, swelling, and redness in her left arm.  Symptoms started last week.  She reports feeling a bump on the back of her left arm and though she may have had a hair in it (which her husband plucked with tweezers).  She does not recall being bitten.  Subsequently, she has had progressive changes in her left arm as noted above.  She saw her PCP on Monday, who prescribed Keflex and tramadol for pain.  Symptoms have progressed despite taking the antibiotic as prescribed since Monday.  In the past 24 hours, she has developed fever to 101 (which she treated with Advil) with associated chills and sweats.  She has had two episodes of nausea and vomiting.  No light-headedness or syncope.  No new or unusual exposures.  No history of skin infections.    The area of redness was marked by her PCP in Monday, and the patient was told to report directly to the hospital if symptoms progressed.  The patient is now concerned about increased pain in her left elbow with decreased ROM.  ED Course: WBC count 13.4.  Lactic acid level 1.15.  BUN 21, Creatinine 1.  She has received 2L NS and IV vancomycin.  She has been transferred to [facility] for further evaluation and treatment.  Review of Systems: As per HPI otherwise 10 point review of systems negative.    Past Medical History:  Diagnosis Date  . Allergy   . Diabetes mellitus   . Hypertension   . Kidney infection     Past Surgical History:  Procedure Laterality Date  . ABDOMINAL HYSTERECTOMY    . CESAREAN SECTION    . EYE SURGERY    . SHOULDER OPEN ROTATOR CUFF  REPAIR  1988     reports that she has never smoked. She has never used smokeless tobacco. She reports that she drinks alcohol. She reports that she does not use drugs.  Rare EtOH use. She is married.  She has two sons.  She is a Oncologist.  Allergies  Allergen Reactions  . Trulicity [Dulaglutide] Other (See Comments)    Pt states that the trulicity makes her feel horrible three days after she uses it. States she will never take it again.   . Latex Rash    Family History  Problem Relation Age of Onset  . Cancer Mother   . Diabetes Father      Prior to Admission medications   Medication Sig Start Date End Date Taking? Authorizing Provider  ATORVASTATIN CALCIUM PO Take by mouth.   Yes Historical Provider, MD  cephALEXin (KEFLEX) 500 MG capsule Take 500 mg by mouth 2 (two) times daily.   Yes Historical Provider, MD  acidophilus (RISAQUAD) CAPS capsule Take 1 capsule by mouth daily.    Historical Provider, MD  amphetamine-dextroamphetamine (ADDERALL XR) 20 MG 24 hr capsule Take 20 mg by mouth daily.    Historical Provider, MD  amphetamine-dextroamphetamine (ADDERALL) 10 MG tablet Take 10 mg by mouth daily with breakfast.    Historical Provider, MD  cholecalciferol (VITAMIN D) 1000 units tablet Take  2,000 Units by mouth daily.    Historical Provider, MD  DULoxetine (CYMBALTA) 30 MG capsule Take 90 mg by mouth daily.    Historical Provider, MD  glyBURIDE (DIABETA) 5 MG tablet Take 5 mg by mouth 2 (two) times daily.     Historical Provider, MD  ibuprofen (ADVIL,MOTRIN) 100 MG tablet Take 100 mg by mouth every 4 (four) hours as needed for pain.     Historical Provider, MD  insulin detemir (LEVEMIR) 100 UNIT/ML injection Inject 15 Units into the skin 2 (two) times daily.    Historical Provider, MD  metFORMIN (GLUCOPHAGE) 1000 MG tablet Take 1,000 mg by mouth 2 (two) times daily.    Historical Provider, MD    Physical Exam: Vitals:   12/07/16 1649 12/07/16 1800 12/07/16 1900  12/07/16 2035  BP:  134/84 123/83 (!) 133/94  Pulse:  113 99 (!) 104  Resp:  18 18 18   Temp:    97.9 F (36.6 C)  TempSrc:    Oral  SpO2:  100% 100% 100%  Weight: 89.4 kg (197 lb)   90.7 kg (199 lb 14.4 oz)  Height: 5\' 6"  (1.676 m)   5\' 6"  (1.676 m)      Constitutional: NAD, calm, comfortable, appears anxious but NONtoxic Vitals:   12/07/16 1649 12/07/16 1800 12/07/16 1900 12/07/16 2035  BP:  134/84 123/83 (!) 133/94  Pulse:  113 99 (!) 104  Resp:  18 18 18   Temp:    97.9 F (36.6 C)  TempSrc:    Oral  SpO2:  100% 100% 100%  Weight: 89.4 kg (197 lb)   90.7 kg (199 lb 14.4 oz)  Height: 5\' 6"  (1.676 m)   5\' 6"  (1.676 m)   Eyes: PERRL, lids and conjunctivae normal ENMT: Mucous membranes are moist but appears to have early thrush. Posterior pharynx clear of any exudate or lesions. Normal dentition.  Neck: normal appearance, supple, no lymphadenopathy Respiratory: clear to auscultation bilaterally, no wheezing, no crackles. Normal respiratory effort. No accessory muscle use.  Cardiovascular: Normal rate, regular rhythm, no murmurs / rubs / gallops. No extremity edema. 2+ pedal pulses. GI: abdomen is soft and compressible.  No distention.  No tenderness.  Bowel sounds are present. Musculoskeletal:  No joint deformity in upper and lower extremities. Mildly reduced ROM at left elbow with increased tenderness to palpation.  No contractures. Normal muscle tone.  Skin: Left arm has extensive, progressive (based on previous demarcation) erythema and soft tissue swelling with areas of fluctuance distal to her left elbow but this does not appear to involve the left elbow itself.  Increased warmth.  No significant TTP except at her left elbow.  No central ulceration or active drainage.  Levido reticularis in bilateral lower extremities. Neurologic: CN 2-12 grossly intact. Sensation intact, Strength symmetric bilaterally, 5/5  Psychiatric: Normal judgment and insight. Alert and oriented x 3.  Normal mood.     Labs on Admission: I have personally reviewed following labs and imaging studies  CBC:  Recent Labs Lab 12/07/16 1800  WBC 13.4*  NEUTROABS 9.8*  HGB 14.0  HCT 41.8  MCV 87.6  PLT [id number]   Basic Metabolic Panel:  Recent Labs Lab 12/07/16 1800  NA 136  K 3.5  CL 102  CO2 25  GLUCOSE 146*  BUN 21*  CREATININE 1.02*  CALCIUM 9.7   GFR: Estimated Creatinine Clearance: 73.2 mL/min (by C-G formula based on SCr of 1.02 mg/dL (H)).  Sepsis Labs:  Lactic acid level 1.15  Assessment/Plan Principal Problem:   Cellulitis Active Problems:   HTN (hypertension)   Diabetes ([place])   Sinus tachycardia      Sepsis secondary to cellulitis of LUE.  High risk patient with her history of uncontrolled Type 2 DM.  Also need to rule out septic arthritis. --Continue IV vanc plus rocephin for now --Blood cultures --Repeat lactic acid, check procalcitonin, sed rate, CRP --NS at 100cc/hr --Routine ortho consult accepted by Dr. Veverly Fells; ortho input greatly appreciated.  Imaging deferred pending ortho evaluation. --Analgesics as needed  Uncontrolled IDDM, patient says that her last A1c was 10 --Continue home dose of levemir --Hold metformin, glyburide while in the hospital --SSI AC/HS --carb controlled diet  HTN, no active therapy at this time, monitor  Sinus tachycardia secondary to sepsis, fever, mild dehydration --Expect improvement with IV hydration, antibiotics   DVT prophylaxis: Lovenox Code Status: FULL Family Communication: Husband present at bedside at time of admission. Disposition Plan: Expect she will go home when ready for discharge. Consults called: Musician (unassigned [facility]; Dr. Veverly Fells) Admission status: Place in observation   TIME SPENT: 46 minutes   Eber Jones MD [facility] Pager [phone]  If 7PM-7AM, please contact night-coverage [email or web address] Password [email or web address]  12/07/2016, 9:51 PM

## 2016-12-07 NOTE — ED Provider Notes (Signed)
[place] DEPT [facility] Provider Note   CSN: [id number] Arrival date & time: 12/07/16  1631     History   Chief Complaint Chief Complaint  Patient presents with  . Arm Problem    HPI CACI WANSER is a 51 y.o. female.   Rash   This is a new problem. The current episode started more than 2 days ago. The problem has been gradually worsening. The problem is associated with an unknown factor. The maximum temperature recorded prior to her arrival was 101 to 101.9 F. The rash is present on the left arm. The pain is at a severity of 6/10. The pain is mild. The pain has been constant since onset. Associated symptoms include pain. Treatments tried: antibiotics. The treatment provided no relief.    Past Medical History:  Diagnosis Date  . Allergy   . Diabetes mellitus   . Hypertension   . Kidney infection     Patient Active Problem List   Diagnosis Date Noted  . Cellulitis 12/07/2016  . HTN (hypertension) 12/07/2016  . Diabetes ([place]) 12/07/2016  . Sinus tachycardia 12/07/2016    Past Surgical History:  Procedure Laterality Date  . ABDOMINAL HYSTERECTOMY    . CESAREAN SECTION    . EYE SURGERY    . SHOULDER OPEN ROTATOR CUFF REPAIR  1988    OB History    No data available       Home Medications    Prior to Admission medications   Medication Sig Start Date End Date Taking? Authorizing Provider  acidophilus (RISAQUAD) CAPS capsule Take 1 capsule by mouth daily.   Yes Historical Provider, MD  amphetamine-dextroamphetamine (ADDERALL XR) 20 MG 24 hr capsule Take 20 mg by mouth daily.   Yes Historical Provider, MD  amphetamine-dextroamphetamine (ADDERALL) 10 MG tablet Take 10 mg by mouth daily with breakfast.   Yes Historical Provider, MD  atorvastatin (LIPITOR) 10 MG tablet Take 10 mg by mouth daily.   Yes Historical Provider, MD  cephALEXin (KEFLEX) 500 MG capsule Take 500 mg by mouth 2 (two) times daily.   Yes Historical Provider, MD  cholecalciferol (VITAMIN D)  1000 units tablet Take 2,000 Units by mouth daily.   Yes Historical Provider, MD  DULoxetine (CYMBALTA) 30 MG capsule Take 90 mg by mouth daily.   Yes Historical Provider, MD  glyBURIDE (DIABETA) 5 MG tablet Take 5 mg by mouth 2 (two) times daily.    Yes Historical Provider, MD  ibuprofen (ADVIL,MOTRIN) 100 MG tablet Take 100 mg by mouth every 4 (four) hours as needed for pain.    Yes Historical Provider, MD  insulin detemir (LEVEMIR) 100 UNIT/ML injection Inject 35 Units into the skin 2 (two) times daily.    Yes Historical Provider, MD  metFORMIN (GLUCOPHAGE) 1000 MG tablet Take 1,000 mg by mouth 2 (two) times daily.   Yes Historical Provider, MD    Family History Family History  Problem Relation Age of Onset  . Cancer Mother   . Diabetes Father     Social History Social History  Substance Use Topics  . Smoking status: Never Smoker  . Smokeless tobacco: Never Used  . Alcohol use Yes     Comment: occ     Allergies   Trulicity [dulaglutide] and Latex   Review of Systems Review of Systems  Skin: Positive for rash.  All other systems reviewed and are negative.    Physical Exam Updated Vital Signs BP (!) 133/94 (BP Location: Right Wrist)   Pulse Marland Kitchen)  104   Temp 97.9 F (36.6 C) (Oral)   Resp 18   Ht 5\' 6"  (1.676 m)   Wt 199 lb 14.4 oz (90.7 kg)   SpO2 100%   BMI 32.26 kg/m   Physical Exam  Constitutional: She is oriented to person, place, and time. She appears well-developed and well-nourished.  HENT:  Head: Normocephalic and atraumatic.  Eyes: Conjunctivae and EOM are normal.  Neck: Normal range of motion.  Cardiovascular: Normal rate and regular rhythm.   Pulmonary/Chest: No stridor. No respiratory distress.  Abdominal: Soft. She exhibits no distension. There is no tenderness.  Neurological: She is alert and oriented to person, place, and time. She displays normal reflexes. No cranial nerve deficit. Coordination normal.  Skin: There is erythema (large area  around elbow and dorsal forearm on left arm approximately 20x10 cm).  Nursing note and vitals reviewed.    ED Treatments / Results  Labs (all labs ordered are listed, but only abnormal results are displayed) Labs Reviewed  CBC WITH DIFFERENTIAL/PLATELET - Abnormal; Notable for the following:       Result Value   WBC 13.4 (*)    Neutro Abs 9.8 (*)    Monocytes Absolute 1.2 (*)    All other components within normal limits  BASIC METABOLIC PANEL - Abnormal; Notable for the following:    Glucose, Bld 146 (*)    BUN 21 (*)    Creatinine, Ser 1.02 (*)    All other components within normal limits  CULTURE, BLOOD (ROUTINE X 2)  CULTURE, BLOOD (ROUTINE X 2)  LACTIC ACID, PLASMA  GLUCOSE, CAPILLARY  LACTIC ACID, PLASMA  PROCALCITONIN  APTT  PROTIME-INR  CBC  BASIC METABOLIC PANEL  SEDIMENTATION RATE  C-REACTIVE PROTEIN  I-STAT CG4 LACTIC ACID, ED    EKG  EKG Interpretation None       Radiology No results found.  Procedures Procedures (including critical care time)  EMERGENCY DEPARTMENT US SOFT TISSUE INTERPRETATION "Study: Limited Soft Tissue Ultrasound"  INDICATIONS: Pain and Soft tissue infection Multiple views of the body part were obtained in real-time with a multi-frequency linear probe PERFORMED BY:  Myself IMAGES ARCHIVED?: Yes SIDE:Left BODY PART:Upper extremity FINDINGS: No abcess noted and Cellulitis present INTERPRETATION:  No abcess noted and Cellulitis present   CPT: Upper extremity 76880-26   Medications Ordered in ED Medications  DULoxetine (CYMBALTA) DR capsule 90 mg (90 mg Oral Not Given 12/07/16 2333)  enoxaparin (LOVENOX) injection 40 mg (40 mg Subcutaneous Given 12/07/16 2335)  0.9 %  sodium chloride infusion ( Intravenous New Bag/Given 12/07/16 2334)  acetaminophen (TYLENOL) tablet 650 mg (not administered)    Or  acetaminophen (TYLENOL) suppository 650 mg (not administered)  HYDROcodone-acetaminophen (NORCO/VICODIN) 5-325 MG per tablet  1-2 tablet (1 tablet Oral Given 12/07/16 2341)  ondansetron (ZOFRAN) tablet 4 mg (not administered)    Or  ondansetron (ZOFRAN) injection 4 mg (not administered)  insulin aspart (novoLOG) injection 0-15 Units (not administered)  insulin aspart (novoLOG) injection 0-5 Units (0 Units Subcutaneous Not Given 12/07/16 2323)  cefTRIAXone (ROCEPHIN) 2 g in dextrose 5 % 50 mL IVPB (2 g Intravenous Given 12/07/16 2334)  nystatin (MYCOSTATIN) 100000 UNIT/ML suspension 500,000 Units (not administered)  vancomycin (VANCOCIN) IVPB 1000 mg/200 mL premix (not administered)  insulin detemir (LEVEMIR) injection 15 Units (15 Units Subcutaneous Not Given 12/07/16 2300)  atorvastatin (LIPITOR) tablet 10 mg (not administered)  sodium chloride 0.9 % bolus 1,000 mL (1,000 mLs Intravenous New Bag/Given 12/07/16 1759)  acetaminophen (TYLENOL) tablet 650  mg (650 mg Oral Given 12/07/16 1805)  fentaNYL (SUBLIMAZE) injection 50 mcg (50 mcg Intravenous Given 12/07/16 1805)  vancomycin (VANCOCIN) IVPB 1000 mg/200 mL premix (0 mg Intravenous Stopped 12/07/16 1914)  ondansetron (ZOFRAN) injection 4 mg (4 mg Intravenous Given 12/07/16 1858)     Initial Impression / Assessment and Plan / ED Course  I have reviewed the triage vital signs and the nursing notes.  Pertinent labs & imaging results that were available during my care of the patient were reviewed by me and considered in my medical decision making (see chart for details).  Clinical Course     Cellulitis has failed outpatient treatment and patient has tachycardia, fever and elevated white blood cell count with that as well. Vancomycin given here. She has no pain with range of motion of her elbow so I have low suspicion for septic arthritis at this point. Doubt DVT wtihout diffuse swelling. No e/o abscess on bedside ultrasound. We'll admit to medicine for IV antibiotics.  Final Clinical Impressions(s) / ED Diagnoses   Final diagnoses:  Cellulitis of left upper extremity      New Prescriptions Current Discharge Medication List       Merrily Pew, MD 12/08/16 [phone]

## 2016-12-07 NOTE — Progress Notes (Signed)
Patient accepted for observation status, med surg bed for diagnosis of cellulitis, progressive despite treatment with Keflex since Monday.  History of HTN and DM.  WBC count 13.4 but no fever.  Lactic acid level 1.15. ED attending performed bedside ultrasound; he did not find a drainable fluid collection.  She has received 2L NS and IV vancomycin in [place].

## 2016-12-08 ENCOUNTER — Observation Stay (HOSPITAL_COMMUNITY): Payer: Managed Care, Other (non HMO)

## 2016-12-08 DIAGNOSIS — E119 Type 2 diabetes mellitus without complications: Secondary | ICD-10-CM | POA: Diagnosis present

## 2016-12-08 DIAGNOSIS — R21 Rash and other nonspecific skin eruption: Secondary | ICD-10-CM | POA: Diagnosis present

## 2016-12-08 DIAGNOSIS — R000 Tachycardia, unspecified: Secondary | ICD-10-CM | POA: Diagnosis present

## 2016-12-08 DIAGNOSIS — E876 Hypokalemia: Secondary | ICD-10-CM | POA: Diagnosis present

## 2016-12-08 DIAGNOSIS — Z79899 Other long term (current) drug therapy: Secondary | ICD-10-CM | POA: Diagnosis not present

## 2016-12-08 DIAGNOSIS — L03114 Cellulitis of left upper limb: Secondary | ICD-10-CM

## 2016-12-08 DIAGNOSIS — A419 Sepsis, unspecified organism: Secondary | ICD-10-CM | POA: Diagnosis present

## 2016-12-08 DIAGNOSIS — Z794 Long term (current) use of insulin: Secondary | ICD-10-CM | POA: Diagnosis not present

## 2016-12-08 DIAGNOSIS — I10 Essential (primary) hypertension: Secondary | ICD-10-CM

## 2016-12-08 DIAGNOSIS — L0390 Cellulitis, unspecified: Secondary | ICD-10-CM | POA: Diagnosis present

## 2016-12-08 DIAGNOSIS — Z91040 Latex allergy status: Secondary | ICD-10-CM | POA: Diagnosis not present

## 2016-12-08 DIAGNOSIS — Z888 Allergy status to other drugs, medicaments and biological substances status: Secondary | ICD-10-CM | POA: Diagnosis not present

## 2016-12-08 DIAGNOSIS — E860 Dehydration: Secondary | ICD-10-CM | POA: Diagnosis present

## 2016-12-08 LAB — BASIC METABOLIC PANEL
Anion gap: 9 (ref 5–15)
BUN: 22 mg/dL — AB (ref 6–20)
CHLORIDE: 101 mmol/L (ref 101–111)
CO2: 26 mmol/L (ref 22–32)
CREATININE: 0.76 mg/dL (ref 0.44–1.00)
Calcium: 8.8 mg/dL — ABNORMAL LOW (ref 8.9–10.3)
Glucose, Bld: 272 mg/dL — ABNORMAL HIGH (ref 65–99)
POTASSIUM: 3.4 mmol/L — AB (ref 3.5–5.1)
Sodium: 136 mmol/L (ref 135–145)

## 2016-12-08 LAB — SEDIMENTATION RATE: SED RATE: 55 mm/h — AB (ref 0–22)

## 2016-12-08 LAB — LACTIC ACID, PLASMA: Lactic Acid, Venous: 1.2 mmol/L (ref 0.5–1.9)

## 2016-12-08 LAB — GLUCOSE, CAPILLARY
GLUCOSE-CAPILLARY: 192 mg/dL — AB (ref 65–99)
Glucose-Capillary: 232 mg/dL — ABNORMAL HIGH (ref 65–99)
Glucose-Capillary: 306 mg/dL — ABNORMAL HIGH (ref 65–99)
Glucose-Capillary: 207 mg/dL — ABNORMAL HIGH (ref 65–99)

## 2016-12-08 LAB — CBC
HCT: 36.9 % (ref 36.0–46.0)
HEMOGLOBIN: 12.6 g/dL (ref 12.0–15.0)
MCH: 29.8 pg (ref 26.0–34.0)
MCHC: 34.1 g/dL (ref 30.0–36.0)
MCV: 87.2 fL (ref 78.0–100.0)
PLATELETS: 241 10*3/uL (ref 150–400)
RBC: 4.23 MIL/uL (ref 3.87–5.11)
RDW: 12.6 % (ref 11.5–15.5)
WBC: 12.5 10*3/uL — ABNORMAL HIGH (ref 4.0–10.5)

## 2016-12-08 LAB — PROTIME-INR
INR: 1.04
Prothrombin Time: 13.7 seconds (ref 11.4–15.2)

## 2016-12-08 LAB — PROCALCITONIN: Procalcitonin: <0.1 ng/mL

## 2016-12-08 LAB — APTT: APTT: 34 s (ref 24–36)

## 2016-12-08 LAB — C-REACTIVE PROTEIN: CRP: 9.6 mg/dL — AB (ref <1.0)

## 2016-12-08 MED ORDER — ATORVASTATIN CALCIUM 10 MG PO TABS: 10.0000 mg | ORAL_TABLET | Freq: Every day | ORAL | Status: DC

## 2016-12-08 MED ORDER — VITAMIN D 1000 UNITS PO TABS
2000.0000 [IU] | ORAL_TABLET | Freq: Every day | ORAL | Status: DC
  Administered 2016-12-08 – 2016-12-09 (×2): 2000 [IU] via ORAL
  Filled 2016-12-08 (×2): qty 2

## 2016-12-08 MED ORDER — KETOROLAC TROMETHAMINE 30 MG/ML IJ SOLN
30.0000 mg | Freq: Four times a day (QID) | INTRAMUSCULAR | Status: DC | PRN
  Administered 2016-12-08 – 2016-12-09 (×3): 30 mg via INTRAVENOUS
  Filled 2016-12-08 (×3): qty 1

## 2016-12-08 MED ORDER — RISAQUAD PO CAPS
1.0000 | ORAL_CAPSULE | Freq: Every day | ORAL | Status: DC
  Administered 2016-12-08 – 2016-12-09 (×2): 1 via ORAL
  Filled 2016-12-08 (×2): qty 1

## 2016-12-08 MED ORDER — POTASSIUM CHLORIDE CRYS ER 20 MEQ PO TBCR
40.0000 meq | EXTENDED_RELEASE_TABLET | ORAL | Status: AC
  Administered 2016-12-08 (×2): 40 meq via ORAL
  Filled 2016-12-08 (×2): qty 2

## 2016-12-08 MED ORDER — MORPHINE SULFATE (PF) 2 MG/ML IV SOLN: 2.0000 mg | INTRAVENOUS | Status: DC | PRN

## 2016-12-08 MED ORDER — AMPHETAMINE-DEXTROAMPHET ER 20 MG PO CP24
20.0000 mg | ORAL_CAPSULE | Freq: Every day | ORAL | Status: DC
  Administered 2016-12-08 – 2016-12-09 (×2): 20 mg via ORAL
  Filled 2016-12-08 (×2): qty 1

## 2016-12-08 MED ORDER — PREMIER PROTEIN SHAKE
11.0000 [oz_av] | Freq: Two times a day (BID) | ORAL | Status: DC
  Administered 2016-12-09: 11 [oz_av] via ORAL

## 2016-12-08 NOTE — Progress Notes (Signed)
Patient in bed resting.  Left arm to receive 'splint' from Ortho this am.  Patient has no complaints of pain.

## 2016-12-08 NOTE — Progress Notes (Signed)
Per new MD order, ice was applied to L elbow and propped on a pillow until a splint can be obtained for patient when ortho tech is here.  Will continue to monitor pt.  Roselind Rily

## 2016-12-08 NOTE — Progress Notes (Signed)
PROGRESS NOTE    Renee Howell  [id number] DOB: July 17, 1966 DOA: 12/07/2016 PCP: No primary care provider on file.   Brief Narrative:  Patient is a 51 year old with history of hypertension, insulin-dependent diabetes who presented to the ED for progressive pain swelling and redness and left upper extremity felt to likely be a cellulitis left foot outpatient treatment with Keflex. Patient has been placed empirically on IV vancomycin and IV Rocephin. Orthopedics following.   Assessment & Plan:   Principal Problem:   Sepsis ([place]) Active Problems:   Cellulitis of arm, left   HTN (hypertension)   Diabetes ([facility])   Sinus tachycardia   Hypokalemia  #1 sepsis secondary to left upper extremity cellulitis Patient on admission noted to meet criteria for sepsis with left upper extremity cellulitis secondary to a leukocytosis and tachycardia. Patient currently afebrile with some improvement with tachycardia. Blood cultures pending. Patient noted to have elevated sedimentation rate and CRP level. Continue empiric IV vancomycin and IV Rocephin. Patient's left upper extremity has been placed in a cast per orthopedic recommendations. IV Toradol when necessary. IV morphine when necessary. Orthopedics following and appreciate input and recommendations.  #2 hypertension Stable.  #3 DM Check a hemoglobin A1c. CBGs ranging from 140- 272. Continue Levemir and sliding scale insulin.  #4 sinus tachycardia Secondary to problem #1. Slowly improving.  #5 hypokalemia Replete.   DVT prophylaxis: Lovenox Code Status: Full Family Communication: Updated patient and husband at bedside. Disposition Plan: Home when medically stable with improvement with left upper extremity cellulitis and per orthopedics.   Consultants:   Orthopedics: Dr. Veverly Fells 12/08/2016  Procedures:   Plain films of the left elbow 12/08/2016  Plain films of the left forearm 12/08/2016  Antimicrobials:   IV Rocephin  12/07/2016  IV vancomycin 12/08/2016   Subjective: Patient laying in bed complaining of significant pending the left upper extremity. Patient denies any chest pain or shortness of breath.  Objective: Vitals:   12/07/16 1900 12/07/16 2035 12/08/16 0650 12/08/16 1355  BP: 123/83 (!) 133/94 113/71 126/90  Pulse: 99 (!) 104 95 (!) 101  Resp: 18 18 18 18   Temp:  97.9 F (36.6 C) 97.7 F (36.5 C) 98.4 F (36.9 C)  TempSrc:  Oral Oral Oral  SpO2: 100% 100% 99% 100%  Weight:  90.7 kg (199 lb 14.4 oz)    Height:  5\' 6"  (1.676 m)      Intake/Output Summary (Last 24 hours) at 12/08/16 2022 Last data filed at 12/08/16 1745  Gross per 24 hour  Intake           533.33 ml  Output             2350 ml  Net         -1816.67 ml   Filed Weights   12/07/16 1649 12/07/16 2035  Weight: 89.4 kg (197 lb) 90.7 kg (199 lb 14.4 oz)    Examination:  General exam: Appears calm and comfortable  Respiratory system: Clear to auscultation. Respiratory effort normal. Cardiovascular system: S1 & S2 heard, RRR. No JVD, murmurs, rubs, gallops or clicks. No pedal edema. Gastrointestinal system: Abdomen is nondistended, soft and nontender. No organomegaly or masses felt. Normal bowel sounds heard. Central nervous system: Alert and oriented. No focal neurological deficits. Extremities: Left upper extremity in cast. Skin: No rashes, lesions or ulcers Psychiatry: Judgement and insight appear normal. Mood & affect appropriate.     Data Reviewed: I have personally reviewed following labs and imaging studies  CBC:  Recent Labs Lab 12/07/16 1800 12/08/16 0122  WBC 13.4* 12.5*  NEUTROABS 9.8*  --   HGB 14.0 12.6  HCT 41.8 36.9  MCV 87.6 87.2  PLT 269 [id number]   Basic Metabolic Panel:  Recent Labs Lab 12/07/16 1800 12/08/16 0122  NA 136 136  K 3.5 3.4*  CL 102 101  CO2 25 26  GLUCOSE 146* 272*  BUN 21* 22*  CREATININE 1.02* 0.76  CALCIUM 9.7 8.8*   GFR: Estimated Creatinine Clearance: 93.4  mL/min (by C-G formula based on SCr of 0.76 mg/dL). Liver Function Tests: No results for input(s): AST, ALT, ALKPHOS, BILITOT, PROT, ALBUMIN in the last 168 hours. No results for input(s): LIPASE, AMYLASE in the last 168 hours. No results for input(s): AMMONIA in the last 168 hours. Coagulation Profile:  Recent Labs Lab 12/08/16 0122  INR 1.04   Cardiac Enzymes: No results for input(s): CKTOTAL, CKMB, CKMBINDEX, TROPONINI in the last 168 hours. BNP (last 3 results) No results for input(s): PROBNP in the last 8760 hours. HbA1C: No results for input(s): HGBA1C in the last 72 hours. CBG:  Recent Labs Lab 12/07/16 2217 12/08/16 0822 12/08/16 1218 12/08/16 1712  GLUCAP 86 232* 306* 207*   Lipid Profile: No results for input(s): CHOL, HDL, LDLCALC, TRIG, CHOLHDL, LDLDIRECT in the last 72 hours. Thyroid Function Tests: No results for input(s): TSH, T4TOTAL, FREET4, T3FREE, THYROIDAB in the last 72 hours. Anemia Panel: No results for input(s): VITAMINB12, FOLATE, FERRITIN, TIBC, IRON, RETICCTPCT in the last 72 hours. Sepsis Labs:  Recent Labs Lab 12/07/16 1758 12/07/16 2246 12/08/16 0122 12/08/16 0129  PROCALCITON  --   --  <0.10  --   LATICACIDVEN 1.15 1.5  --  1.2    No results found for this or any previous visit (from the past 240 hour(s)).       Radiology Studies: Dg Elbow 2 Views Left  Result Date: 12/08/2016 CLINICAL DATA:  51 year old female with cellulitis beginning near the elbow 5 days ago. Associated fever and upper extremity pain. Initial encounter. EXAM: LEFT ELBOW - 2 VIEW COMPARISON:  None. FINDINGS: There appears to be splint material along the posterior left arm. This mildly decreases bone detail. Bone mineralization is within normal limits. On the lateral view the elbow is not flexed to 90 degrees but no joint effusion is evident. Joint spaces and alignment are preserved. There is a small degenerative appearing ossific fragment at the lateral  epicondyles. No osteolysis or fracture identified. There does appear to be soft tissue swelling and stranding posterior to the elbow. No subcutaneous gas identified. No radiopaque foreign body identified. IMPRESSION: No acute osseous abnormality identified. No subcutaneous gas or radiopaque foreign body identified. Soft tissue swelling posterior to the elbow. Electronically Signed   By: Genevie Ann M.D.   On: 12/08/2016 11:37   Dg Forearm Left  Result Date: 12/08/2016 CLINICAL DATA:  51 year old female with cellulitis beginning near the elbow 5 days ago. Associated fever and upper extremity pain. Initial encounter. EXAM: LEFT FOREARM - 2 VIEW COMPARISON:  Left elbow views from today. FINDINGS: Splint material again appears to be in place along the posterior left upper extremity. Normal bone mineralization in the left radius and ulna. Alignment at the left elbow and wrist appears preserved. Dorsal and medial soft tissue swelling and stranding. No subcutaneous gas. No radiopaque foreign body identified. No osteolysis. No acute osseous abnormality identified. IMPRESSION: No acute osseous abnormality in the left forearm. Soft tissue swelling with no subcutaneous gas or radiopaque  foreign body identified. Electronically Signed   By: Genevie Ann M.D.   On: 12/08/2016 11:38        Scheduled Meds: . acidophilus  1 capsule Oral Daily  . amphetamine-dextroamphetamine  20 mg Oral Daily  . atorvastatin  10 mg Oral q1800  . cefTRIAXone (ROCEPHIN)  IV  2 g Intravenous Q24H  . cholecalciferol  2,000 Units Oral Daily  . DULoxetine  90 mg Oral Daily  . enoxaparin (LOVENOX) injection  40 mg Subcutaneous Q24H  . insulin aspart  0-15 Units Subcutaneous TID WC  . insulin aspart  0-5 Units Subcutaneous QHS  . insulin detemir  15 Units Subcutaneous BID  . protein supplement shake  11 oz Oral BID BM  . vancomycin  1,000 mg Intravenous Q12H   Continuous Infusions: . sodium chloride 100 mL/hr at 12/08/16 1126     LOS: 0  days    Time spent: 24 minutes    THOMPSON,DANIEL, MD [facility] Pager [phone]  If 7PM-7AM, please contact night-coverage [email or web address] Password [email or web address] 12/08/2016, 8:22 PM

## 2016-12-08 NOTE — Consult Note (Addendum)
Reason for Consult:left arm infection Referring Physician: [facility]  Renee Howell is an 51 y.o. female.  HPI: 51 yo female with diabetes who presents with a one week history of left arm pain and swelling worse over the last 48 hours. Patient reports this beginning in the forearm with something like an ingrown hair that her husband tried to pick out.  From that spot she noted redness and swelling.  She presented to her physician Monday this week who prescribed oral Keflex.  She has noted increased pain and expanding redness since that time with fevers greater than 101 degrees F.  She is currently admitted for IV antibiotics and observation.  I was asked to see the patient to rule out sepsis.   Past Medical History:  Diagnosis Date  . Allergy   . Diabetes mellitus   . Hypertension   . Kidney infection     Past Surgical History:  Procedure Laterality Date  . ABDOMINAL HYSTERECTOMY    . CESAREAN SECTION    . EYE SURGERY    . SHOULDER OPEN ROTATOR CUFF REPAIR  1988    Family History  Problem Relation Age of Onset  . Cancer Mother   . Diabetes Father     Social History:  reports that she has never smoked. She has never used smokeless tobacco. She reports that she drinks alcohol. She reports that she does not use drugs.  Allergies:  Allergies  Allergen Reactions  . Trulicity [Dulaglutide] Other (See Comments)    Pt states that the trulicity makes her feel horrible three days after she uses it. States she will never take it again.   . Latex Rash    Medications: I have reviewed the patient's current medications.  Results for orders placed or performed during the hospital encounter of 12/07/16 (from the past 48 hour(s))  I-Stat CG4 Lactic Acid, ED     Status: None   Collection Time: 12/07/16  5:58 PM  Result Value Ref Range   Lactic Acid, Venous 1.15 0.5 - 1.9 mmol/L  CBC with Differential     Status: Abnormal   Collection Time: 12/07/16  6:00 PM  Result Value  Ref Range   WBC 13.4 (H) 4.0 - 10.5 K/uL   RBC 4.77 3.87 - 5.11 MIL/uL   Hemoglobin 14.0 12.0 - 15.0 g/dL   HCT 41.8 36.0 - 46.0 %   MCV 87.6 78.0 - 100.0 fL   MCH 29.4 26.0 - 34.0 pg   MCHC 33.5 30.0 - 36.0 g/dL   RDW 12.7 11.5 - 15.5 %   Platelets 269 150 - 400 K/uL   Neutrophils Relative % 73 %   Neutro Abs 9.8 (H) 1.7 - 7.7 K/uL   Lymphocytes Relative 17 %   Lymphs Abs 2.3 0.7 - 4.0 K/uL   Monocytes Relative 9 %   Monocytes Absolute 1.2 (H) 0.1 - 1.0 K/uL   Eosinophils Relative 1 %   Eosinophils Absolute 0.1 0.0 - 0.7 K/uL   Basophils Relative 0 %   Basophils Absolute 0.0 0.0 - 0.1 K/uL  Basic metabolic panel     Status: Abnormal   Collection Time: 12/07/16  6:00 PM  Result Value Ref Range   Sodium 136 135 - 145 mmol/L   Potassium 3.5 3.5 - 5.1 mmol/L   Chloride 102 101 - 111 mmol/L   CO2 25 22 - 32 mmol/L   Glucose, Bld 146 (H) 65 - 99 mg/dL   BUN 21 (H) 6 - 20 mg/dL  Creatinine, Ser 1.02 (H) 0.44 - 1.00 mg/dL   Calcium 9.7 8.9 - 10.3 mg/dL   GFR calc non Af Amer >60 >60 mL/min   GFR calc Af Amer >60 >60 mL/min    Comment: (NOTE) The eGFR has been calculated using the CKD EPI equation. This calculation has not been validated in all clinical situations. eGFR's persistently <60 mL/min signify possible Chronic Kidney Disease.    Anion gap 9 5 - 15  Glucose, capillary     Status: None   Collection Time: 12/07/16 10:17 PM  Result Value Ref Range   Glucose-Capillary 86 65 - 99 mg/dL  Lactic acid, plasma     Status: None   Collection Time: 12/07/16 10:46 PM  Result Value Ref Range   Lactic Acid, Venous 1.5 0.5 - 1.9 mmol/L  APTT     Status: None   Collection Time: 12/08/16  1:22 AM  Result Value Ref Range   aPTT 34 24 - 36 seconds  Protime-INR     Status: None   Collection Time: 12/08/16  1:22 AM  Result Value Ref Range   Prothrombin Time 13.7 11.4 - 15.2 seconds   INR 1.04   CBC     Status: Abnormal   Collection Time: 12/08/16  1:22 AM  Result Value Ref  Range   WBC 12.5 (H) 4.0 - 10.5 K/uL   RBC 4.23 3.87 - 5.11 MIL/uL   Hemoglobin 12.6 12.0 - 15.0 g/dL   HCT 36.9 36.0 - 46.0 %   MCV 87.2 78.0 - 100.0 fL   MCH 29.8 26.0 - 34.0 pg   MCHC 34.1 30.0 - 36.0 g/dL   RDW 12.6 11.5 - 15.5 %   Platelets 241 150 - 400 K/uL  Basic metabolic panel     Status: Abnormal   Collection Time: 12/08/16  1:22 AM  Result Value Ref Range   Sodium 136 135 - 145 mmol/L   Potassium 3.4 (L) 3.5 - 5.1 mmol/L   Chloride 101 101 - 111 mmol/L   CO2 26 22 - 32 mmol/L   Glucose, Bld 272 (H) 65 - 99 mg/dL   BUN 22 (H) 6 - 20 mg/dL   Creatinine, Ser 0.76 0.44 - 1.00 mg/dL   Calcium 8.8 (L) 8.9 - 10.3 mg/dL   GFR calc non Af Amer >60 >60 mL/min   GFR calc Af Amer >60 >60 mL/min    Comment: (NOTE) The eGFR has been calculated using the CKD EPI equation. This calculation has not been validated in all clinical situations. eGFR's persistently <60 mL/min signify possible Chronic Kidney Disease.    Anion gap 9 5 - 15  Lactic acid, plasma     Status: None   Collection Time: 12/08/16  1:29 AM  Result Value Ref Range   Lactic Acid, Venous 1.2 0.5 - 1.9 mmol/L    No imaging currently - elbow and forearm XRAYs ordered  ROS Blood pressure (!) 133/94, pulse (!) 104, temperature 97.9 F (36.6 C), temperature source Oral, resp. rate 18, height _0  (1.676 m), weight 90.7 kg (199 lb 14.4 oz), SpO2 100 %.   Physical Exam   Patient is a healthy appearing female in mild distress. Left shoulder with minimal tenderness(baseline per her from prior shoulder surgery) No proximal adenopathy, supple arm above the elbow, min tender, area of erythema demarcated with a sharpie, covering the elbow and lateral arm and forearm, no effusion noted at the elbow. No pain with AROM and PROM of the elbow in  a neutral arc.  Full supination and pronation. No tenderness focal to the elbow joint itself. Mild to moderate induration noted at the proximal lateral forearm, wrist is supple with normal  AROM but some pain with AROM located at the dorsal forearm tendons above the wrist, wrist joint without effusion or swelling, NVI  Assessment/Plan: Left arm cellulitis with point of entry likely the "ingrown hair" and subsequent attempt at removal/drainage away from the elbow joint. No evidence clinically of elbow septic arthritis at this time. Recommend IV antibiotics per Internal Medicine. Also recommend immobilization of the left arm with long arm splint to rest the soft tissues that is removable to allow for inspection. XRAYs pending Will follow up on this.  Thank you!  Jantz Main,STEVEN R 12/08/2016, 2:27 AM

## 2016-12-08 NOTE — Progress Notes (Signed)
Initial Nutrition Assessment  DOCUMENTATION CODES:   Obesity unspecified  INTERVENTION:   Provide Premier Protein supplements BID, each provide 160 kcal and 30 g protein. Encourage PO intake RD to continue to monitor  NUTRITION DIAGNOSIS:   Inadequate oral intake related to nausea, vomiting as evidenced by per patient/family report.  GOAL:   Patient will meet greater than or equal to 90% of their needs  MONITOR:   PO intake, Supplement acceptance, Labs, Weight trends, I & O's, Skin  REASON FOR ASSESSMENT:   Malnutrition Screening Tool    ASSESSMENT:    51 yo female with diabetes who presents with a one week history of left arm pain and swelling worse over the last 48 hours. Patient reports this beginning in the forearm with something like an ingrown hair that her husband tried to pick out.  From that spot she noted redness and swelling.  She presented to her physician Monday this week who prescribed oral Keflex.  She has noted increased pain and expanding redness since that time with fevers greater than 101 degrees F.   Patient in room with family at bedside. Pt reports poor appetite since 1/6 when she developed N/V. Pt eating fairly well today, she had oatmeal and yogurt for breakfast and a sandwich and yogurt for lunch. Pt interested in nutrition supplements, willing to try Premier Protein supplements. RD will order.  Per chart review, pt has lost 16 lb since 10/10 (7% wt loss x 3 months, insignificant for time frame). Pt reports UBW of 197-205 lb. Nutrition focused physical exam shows no sign of depletion of muscle mass or body fat.  Medications: Vitamin D tablet daily  Labs reviewed: CBGs: 232-306 Low K  Diet Order:  Diet Carb Modified Fluid consistency: Thin; Room service appropriate? Yes  Skin:  Wound (see comment) (cellulitis on arm)  Last BM:  1/8  Height:   Ht Readings from Last 1 Encounters:  12/07/16 5\' 6"  (1.676 m)    Weight:   Wt Readings from Last  1 Encounters:  12/07/16 199 lb 14.4 oz (90.7 kg)    Ideal Body Weight:  59.1 kg  BMI:  Body mass index is 32.26 kg/m.  Estimated Nutritional Needs:   Kcal:  1700-1900  Protein:  70-80g  Fluid:  1.9L/day  EDUCATION NEEDS:   No education needs identified at this time  Clayton Bibles, MS, RD, LDN Pager: [phone] After Hours Pager: [phone]

## 2016-12-09 DIAGNOSIS — Z794 Long term (current) use of insulin: Secondary | ICD-10-CM

## 2016-12-09 DIAGNOSIS — E119 Type 2 diabetes mellitus without complications: Secondary | ICD-10-CM

## 2016-12-09 LAB — CBC WITH DIFFERENTIAL/PLATELET
Basophils Absolute: 0 10*3/uL (ref 0.0–0.1)
Basophils Relative: 0 %
Eosinophils Absolute: 0.1 10*3/uL (ref 0.0–0.7)
Eosinophils Relative: 2 %
HCT: 36.8 % (ref 36.0–46.0)
HEMOGLOBIN: 12.3 g/dL (ref 12.0–15.0)
LYMPHS ABS: 2.9 10*3/uL (ref 0.7–4.0)
LYMPHS PCT: 35 %
MCH: 29.2 pg (ref 26.0–34.0)
MCHC: 33.4 g/dL (ref 30.0–36.0)
MCV: 87.4 fL (ref 78.0–100.0)
MONOS PCT: 8 %
Monocytes Absolute: 0.7 10*3/uL (ref 0.1–1.0)
NEUTROS PCT: 55 %
Neutro Abs: 4.6 10*3/uL (ref 1.7–7.7)
Platelets: 236 10*3/uL (ref 150–400)
RBC: 4.21 MIL/uL (ref 3.87–5.11)
RDW: 12.7 % (ref 11.5–15.5)
WBC: 8.3 10*3/uL (ref 4.0–10.5)

## 2016-12-09 LAB — BASIC METABOLIC PANEL
Anion gap: 6 (ref 5–15)
BUN: 20 mg/dL (ref 6–20)
CHLORIDE: 107 mmol/L (ref 101–111)
CO2: 25 mmol/L (ref 22–32)
Calcium: 8.8 mg/dL — ABNORMAL LOW (ref 8.9–10.3)
Creatinine, Ser: 0.66 mg/dL (ref 0.44–1.00)
GFR calc Af Amer: >60 mL/min (ref >60)
GFR calc non Af Amer: >60 mL/min (ref >60)
GLUCOSE: 211 mg/dL — AB (ref 65–99)
POTASSIUM: 4.6 mmol/L (ref 3.5–5.1)
Sodium: 138 mmol/L (ref 135–145)

## 2016-12-09 LAB — GLUCOSE, CAPILLARY: Glucose-Capillary: 192 mg/dL — ABNORMAL HIGH (ref 65–99)

## 2016-12-09 MED ORDER — CEPHALEXIN 500 MG PO CAPS: 500.0000 mg | ORAL_CAPSULE | Freq: Four times a day (QID) | ORAL | 0 refills | Status: AC

## 2016-12-09 MED ORDER — DOXYCYCLINE HYCLATE 100 MG PO CAPS: 100.0000 mg | ORAL_CAPSULE | Freq: Two times a day (BID) | ORAL | 0 refills | Status: AC

## 2016-12-09 MED ORDER — INSULIN DETEMIR 100 UNIT/ML ~~LOC~~ SOLN
18.0000 [IU] | Freq: Two times a day (BID) | SUBCUTANEOUS | Status: DC
  Administered 2016-12-09: 18 [IU] via SUBCUTANEOUS
  Filled 2016-12-09: qty 0.18

## 2016-12-09 MED ORDER — TRAMADOL HCL 50 MG PO TABS: 50.0000 mg | ORAL_TABLET | Freq: Four times a day (QID) | ORAL | 0 refills | Status: DC | PRN

## 2016-12-09 MED ORDER — TRAMADOL HCL 50 MG PO TABS: 100.0000 mg | ORAL_TABLET | Freq: Four times a day (QID) | ORAL | Status: DC | PRN

## 2016-12-09 NOTE — Progress Notes (Signed)
   Subjective:    Recheck left elbow/arm s/p splint and IV antibiotics Pt feeling much better this morning rom has improved and pain is much improved Denies any new symptoms or issues  Patient reports pain as mild.  Objective:   VITALS:   Vitals:   12/08/16 2110 12/09/16 0518  BP: 114/80 114/70  Pulse: 100 95  Resp: 18 18  Temp: 98.3 F (36.8 C) 97.8 F (36.6 C)    Splint removed to show decreased erythema and edema nv intact distally Much improved from the marker on her skin No signs of skin breakdown or blisters  LABS  Recent Labs  12/07/16 1800 12/08/16 0122 12/09/16 0444  HGB 14.0 12.6 12.3  HCT 41.8 36.9 36.8  WBC 13.4* 12.5* 8.3  PLT 269 241 236     Recent Labs  12/07/16 1800 12/08/16 0122 12/09/16 0444  NA 136 136 138  K 3.5 3.4* 4.6  BUN 21* 22* 20  CREATININE 1.02* 0.76 0.66  GLUCOSE 146* 272* 211*     Assessment/Plan:   Recommend splint wear for the next week until cellulitis resolves Continue oral antibiotics for at least 1 week after d/c  Currently stable from orthopedic standpoint for d/c once cleared by medical team Will sign off for now Please contact for worsening symptoms No need to f/u as outpatient     Merla Riches, MPAS, PA-C  12/09/2016, 7:30 AM

## 2016-12-09 NOTE — Discharge Summary (Signed)
Physician Discharge Summary  Renee Howell [id number] DOB: 09-15-1966 DOA: 12/07/2016  PCP: Leotis Pain, DO  Admit date: 12/07/2016 Discharge date: 12/09/2016  Time spent: 65 minutes  Recommendations for Outpatient Follow-up:  1. Follow-up with [facility] Le, DO in 1-2 weeks. On follow-up patient's cellulitis will need to be reassessed.   Discharge Diagnoses:  Principal Problem:   Sepsis ([place]) Active Problems:   Cellulitis of arm, left   HTN (hypertension)   Diabetes ([place])   Sinus tachycardia   Hypokalemia   Cellulitis of left upper extremity   Cellulitis   Discharge Condition: Stable and improved.  Diet recommendation: Carb modified diet.  Filed Weights   12/07/16 1649 12/07/16 2035  Weight: 89.4 kg (197 lb) 90.7 kg (199 lb 14.4 oz)    History of present illness:  Per Dr. Delma Officer Renee Howell is a 51 y.o. woman with a history of HTN and Insulin dependent diabetes who presented to the ED in [facility] for evaluation of progressive pain, swelling, and redness in her left arm.  Symptoms started last week.  She reported feeling a bump on the back of her left arm and though she may have had a hair in it (which her husband plucked with tweezers).  She does not recall being bitten.  Subsequently, she has had progressive changes in her left arm as noted above.  She saw her PCP on Monday, who prescribed Keflex and tramadol for pain.  Symptoms have progressed despite taking the antibiotic as prescribed since Monday.  In the past 24 hours, she has developed fever to 101 (which she treated with Advil) with associated chills and sweats.  She has had two episodes of nausea and vomiting.  No light-headedness or syncope.  No new or unusual exposures.  No history of skin infections.    The area of redness was marked by her PCP in Monday, and the patient was told to report directly to the hospital if symptoms progressed.  The patient is now concerned about increased  pain in her left elbow with decreased ROM.  ED Course: WBC count 13.4.  Lactic acid level 1.15.  BUN 21, Creatinine 1.  She has received 2L NS and IV vancomycin.  She has been transferred to [facility] for further evaluation and treatment.   Hospital Course:  #1 sepsis secondary to left upper extremity cellulitis Patient on admission noted to meet criteria for sepsis with left upper extremity cellulitis secondary to a leukocytosis and tachycardia. Patient was admitted and placed empirically on IV Rocephin and vancomycin. Blood cultures were ordered and were pending at time of discharge. Orthopedics consulted, and patient placed in a splint. Patient noted to have elevated sedimentation rate and CRP level. Plain films of the elbow and left forearm were obtained which was negative for any osseous abnormalities, no subcutaneous gas or foreign body identified. Soft tissue swelling posterior to the elbow and soft tissue swelling of the left forearm. Patient improved clinically on empiric IV antibiotics. Patient's pain was managed with IV toradol. Patient be discharged home in stable and improved condition on Keflex 500 mg 4 times daily and doxycycline 100 mg twice daily for 6 more days to complete a course of antibiotic treatment. Patient has been instructed about the pedis keep the splint on for at least a week until cellulitis improves. Outpatient follow-up with PCP.   #2 hypertension Stable.  #3 DM Patient is on oral hypoglycemic agents were held. Patient was maintained on sliding scale insulin as  well as Levemir. Outpatient follow-up.   #4 sinus tachycardia Secondary to problem #1. Improved.  #5 hypokalemia Repleted.  Procedures:  Plain films of the left elbow 12/08/2016  Plain films of the left forearm 12/08/2016  Consultations:  Orthopedics: Dr. Veverly Fells 12/08/2016  Discharge Exam: Vitals:   12/08/16 2110 12/09/16 0518  BP: 114/80 114/70  Pulse: 100 95  Resp: 18 18  Temp: 98.3 F  (36.8 C) 97.8 F (36.6 C)    General: NAD Cardiovascular: RRR Respiratory: CTAB Ext: LUE in cast with improvement with cellulitis.  Discharge Instructions   Discharge Instructions    Diet Carb Modified    Complete by:  As directed    Increase activity slowly    Complete by:  As directed      Current Discharge Medication List    START taking these medications   Details  doxycycline (VIBRAMYCIN) 100 MG capsule Take 1 capsule (100 mg total) by mouth 2 (two) times daily. Qty: 12 capsule, Refills: 0    traMADol (ULTRAM) 50 MG tablet Take 1-2 tablets (50-100 mg total) by mouth every 6 (six) hours as needed for moderate pain. Qty: 20 tablet, Refills: 0      CONTINUE these medications which have CHANGED   Details  cephALEXin (KEFLEX) 500 MG capsule Take 1 capsule (500 mg total) by mouth 4 (four) times daily. Qty: 24 capsule, Refills: 0      CONTINUE these medications which have NOT CHANGED   Details  acidophilus (RISAQUAD) CAPS capsule Take 1 capsule by mouth daily.    amphetamine-dextroamphetamine (ADDERALL XR) 20 MG 24 hr capsule Take 20 mg by mouth daily.    amphetamine-dextroamphetamine (ADDERALL) 10 MG tablet Take 10 mg by mouth daily with breakfast.    atorvastatin (LIPITOR) 10 MG tablet Take 10 mg by mouth daily.    cholecalciferol (VITAMIN D) 1000 units tablet Take 2,000 Units by mouth daily.    DULoxetine (CYMBALTA) 30 MG capsule Take 90 mg by mouth daily.    glyBURIDE (DIABETA) 5 MG tablet Take 5 mg by mouth 2 (two) times daily.     ibuprofen (ADVIL,MOTRIN) 100 MG tablet Take 100 mg by mouth every 4 (four) hours as needed for pain.     insulin detemir (LEVEMIR) 100 UNIT/ML injection Inject 35 Units into the skin 2 (two) times daily.     metFORMIN (GLUCOPHAGE) 1000 MG tablet Take 1,000 mg by mouth 2 (two) times daily.       Allergies  Allergen Reactions  . Trulicity [Dulaglutide] Other (See Comments)    Pt states that the trulicity makes her feel  horrible three days after she uses it. States she will never take it again.   . Latex Rash   Follow-up Information    Thao Phuong Le, DO. Schedule an appointment as soon as possible for a visit in 2 week(s).   Specialty:  Family Medicine Why:  f/u in 1-2 weeks. Contact information: [address] [place] [address] [phone]            The results of significant diagnostics from this hospitalization (including imaging, microbiology, ancillary and laboratory) are listed below for reference.    Significant Diagnostic Studies: Dg Elbow 2 Views Left  Result Date: 12/08/2016 CLINICAL DATA:  51 year old female with cellulitis beginning near the elbow 5 days ago. Associated fever and upper extremity pain. Initial encounter. EXAM: LEFT ELBOW - 2 VIEW COMPARISON:  None. FINDINGS: There appears to be splint material along the posterior left  arm. This mildly decreases bone detail. Bone mineralization is within normal limits. On the lateral view the elbow is not flexed to 90 degrees but no joint effusion is evident. Joint spaces and alignment are preserved. There is a small degenerative appearing ossific fragment at the lateral epicondyles. No osteolysis or fracture identified. There does appear to be soft tissue swelling and stranding posterior to the elbow. No subcutaneous gas identified. No radiopaque foreign body identified. IMPRESSION: No acute osseous abnormality identified. No subcutaneous gas or radiopaque foreign body identified. Soft tissue swelling posterior to the elbow. Electronically Signed   By: Genevie Ann M.D.   On: 12/08/2016 11:37   Dg Forearm Left  Result Date: 12/08/2016 CLINICAL DATA:  51 year old female with cellulitis beginning near the elbow 5 days ago. Associated fever and upper extremity pain. Initial encounter. EXAM: LEFT FOREARM - 2 VIEW COMPARISON:  Left elbow views from today. FINDINGS: Splint material again appears to be in place along the posterior left upper  extremity. Normal bone mineralization in the left radius and ulna. Alignment at the left elbow and wrist appears preserved. Dorsal and medial soft tissue swelling and stranding. No subcutaneous gas. No radiopaque foreign body identified. No osteolysis. No acute osseous abnormality identified. IMPRESSION: No acute osseous abnormality in the left forearm. Soft tissue swelling with no subcutaneous gas or radiopaque foreign body identified. Electronically Signed   By: Genevie Ann M.D.   On: 12/08/2016 11:38    Microbiology: No results found for this or any previous visit (from the past 240 hour(s)).   Labs: Basic Metabolic Panel:  Recent Labs Lab 12/07/16 1800 12/08/16 0122 12/09/16 0444  NA 136 136 138  K 3.5 3.4* 4.6  CL 102 101 107  CO2 25 26 25   GLUCOSE 146* 272* 211*  BUN 21* 22* 20  CREATININE 1.02* 0.76 0.66  CALCIUM 9.7 8.8* 8.8*   Liver Function Tests: No results for input(s): AST, ALT, ALKPHOS, BILITOT, PROT, ALBUMIN in the last 168 hours. No results for input(s): LIPASE, AMYLASE in the last 168 hours. No results for input(s): AMMONIA in the last 168 hours. CBC:  Recent Labs Lab 12/07/16 1800 12/08/16 0122 12/09/16 0444  WBC 13.4* 12.5* 8.3  NEUTROABS 9.8*  --  4.6  HGB 14.0 12.6 12.3  HCT 41.8 36.9 36.8  MCV 87.6 87.2 87.4  PLT 269 241 236   Cardiac Enzymes: No results for input(s): CKTOTAL, CKMB, CKMBINDEX, TROPONINI in the last 168 hours. BNP: BNP (last 3 results) No results for input(s): BNP in the last 8760 hours.  ProBNP (last 3 results) No results for input(s): PROBNP in the last 8760 hours.  CBG:  Recent Labs Lab 12/08/16 0822 12/08/16 1218 12/08/16 1712 12/08/16 2102 12/09/16 0755  GLUCAP 232* 306* 207* 192* 192*       Signed:  Deanette Tullius MD.  [facility] 12/09/2016, 11:01 AM

## 2016-12-13 ENCOUNTER — Other Ambulatory Visit: Payer: Self-pay | Admitting: *Deleted

## 2016-12-13 LAB — CULTURE, BLOOD (ROUTINE X 2)
CULTURE: NO GROWTH
CULTURE: NO GROWTH

## 2016-12-13 NOTE — Patient Outreach (Addendum)
[facility]) Care Management  12/13/2016  Renee Howell January 13, 1966 [id number]  Received verbal update from Wilhemina Cash at [place], states patient is active with internal CM program, and no [facility] Care Management RNCM outreach needed at this itme.   RNCM will send case closure due to other CM program request to [facility].   Cherese Lozano H. Annia Friendly, BSN, [facility] Telephonic CM Phone: [phone] Fax: [phone]

## 2017-02-24 ENCOUNTER — Ambulatory Visit
Admission: RE | Admit: 2017-02-24 | Discharge: 2017-02-24 | Disposition: A | Discharge status: Home / Self Care | Payer: Managed Care, Other (non HMO) | Source: Ambulatory Visit | Attending: *Deleted | Admitting: *Deleted

## 2017-02-24 ENCOUNTER — Other Ambulatory Visit: Payer: Self-pay | Admitting: *Deleted

## 2017-02-24 ENCOUNTER — Other Ambulatory Visit: Payer: Self-pay | Admitting: Family Medicine

## 2017-02-24 DIAGNOSIS — Q740 Other congenital malformations of upper limb(s), including shoulder girdle: Secondary | ICD-10-CM

## 2017-03-13 ENCOUNTER — Emergency Department (HOSPITAL_BASED_OUTPATIENT_CLINIC_OR_DEPARTMENT_OTHER)
Admission: EM | Admit: 2017-03-13 | Discharge: 2017-03-13 | Disposition: A | Discharge status: Home / Self Care | Payer: Managed Care, Other (non HMO) | Attending: Emergency Medicine | Admitting: Emergency Medicine

## 2017-03-13 ENCOUNTER — Encounter (HOSPITAL_BASED_OUTPATIENT_CLINIC_OR_DEPARTMENT_OTHER): Payer: Self-pay

## 2017-03-13 ENCOUNTER — Emergency Department (HOSPITAL_BASED_OUTPATIENT_CLINIC_OR_DEPARTMENT_OTHER): Payer: Managed Care, Other (non HMO)

## 2017-03-13 DIAGNOSIS — Z794 Long term (current) use of insulin: Secondary | ICD-10-CM | POA: Diagnosis not present

## 2017-03-13 DIAGNOSIS — E119 Type 2 diabetes mellitus without complications: Secondary | ICD-10-CM | POA: Insufficient documentation

## 2017-03-13 DIAGNOSIS — Z79899 Other long term (current) drug therapy: Secondary | ICD-10-CM | POA: Diagnosis not present

## 2017-03-13 DIAGNOSIS — R0602 Shortness of breath: Secondary | ICD-10-CM | POA: Diagnosis present

## 2017-03-13 DIAGNOSIS — J189 Pneumonia, unspecified organism: Secondary | ICD-10-CM

## 2017-03-13 DIAGNOSIS — J181 Lobar pneumonia, unspecified organism: Secondary | ICD-10-CM | POA: Insufficient documentation

## 2017-03-13 DIAGNOSIS — I10 Essential (primary) hypertension: Secondary | ICD-10-CM | POA: Diagnosis not present

## 2017-03-13 DIAGNOSIS — Z791 Long term (current) use of non-steroidal anti-inflammatories (NSAID): Secondary | ICD-10-CM | POA: Diagnosis not present

## 2017-03-13 LAB — CBC WITH DIFFERENTIAL/PLATELET
BASOS PCT: 0 %
Basophils Absolute: 0 10*3/uL (ref 0.0–0.1)
EOS PCT: 1 %
Eosinophils Absolute: 0.1 10*3/uL (ref 0.0–0.7)
HCT: 39.5 % (ref 36.0–46.0)
Hemoglobin: 13.5 g/dL (ref 12.0–15.0)
LYMPHS PCT: 28 %
Lymphs Abs: 2.3 10*3/uL (ref 0.7–4.0)
MCH: 29.2 pg (ref 26.0–34.0)
MCHC: 34.2 g/dL (ref 30.0–36.0)
MCV: 85.5 fL (ref 78.0–100.0)
MONO ABS: 0.6 10*3/uL (ref 0.1–1.0)
MONOS PCT: 7 %
NEUTROS ABS: 5.3 10*3/uL (ref 1.7–7.7)
Neutrophils Relative %: 64 %
Platelets: 246 10*3/uL (ref 150–400)
RBC: 4.62 MIL/uL (ref 3.87–5.11)
RDW: 13.3 % (ref 11.5–15.5)
WBC: 8.3 10*3/uL (ref 4.0–10.5)

## 2017-03-13 LAB — COMPREHENSIVE METABOLIC PANEL
ALT: 18 U/L (ref 14–54)
ANION GAP: 8 (ref 5–15)
AST: 18 U/L (ref 15–41)
Albumin: 4 g/dL (ref 3.5–5.0)
Alkaline Phosphatase: 89 U/L (ref 38–126)
BILIRUBIN TOTAL: 0.8 mg/dL (ref 0.3–1.2)
BUN: 17 mg/dL (ref 6–20)
CO2: 28 mmol/L (ref 22–32)
Calcium: 9.2 mg/dL (ref 8.9–10.3)
Chloride: 102 mmol/L (ref 101–111)
Creatinine, Ser: 0.79 mg/dL (ref 0.44–1.00)
GFR calc Af Amer: >60 mL/min (ref >60)
Glucose, Bld: 313 mg/dL — ABNORMAL HIGH (ref 65–99)
POTASSIUM: 4.5 mmol/L (ref 3.5–5.1)
Sodium: 138 mmol/L (ref 135–145)
TOTAL PROTEIN: 7.3 g/dL (ref 6.5–8.1)

## 2017-03-13 LAB — D-DIMER, QUANTITATIVE (NOT AT ARMC): D DIMER QUANT: 0.44 ug{FEU}/mL (ref 0.00–0.50)

## 2017-03-13 LAB — TROPONIN I

## 2017-03-13 MED ORDER — AZITHROMYCIN 250 MG PO TABS
500.0000 mg | ORAL_TABLET | Freq: Once | ORAL | Status: AC
  Administered 2017-03-13: 500 mg via ORAL
  Filled 2017-03-13: qty 2

## 2017-03-13 MED ORDER — FLUCONAZOLE 150 MG PO TABS: 150.0000 mg | ORAL_TABLET | Freq: Once | ORAL | 0 refills | Status: AC

## 2017-03-13 MED ORDER — AZITHROMYCIN 250 MG PO TABS: 250.0000 mg | ORAL_TABLET | Freq: Every day | ORAL | 0 refills | Status: DC

## 2017-03-13 MED FILL — AZITHROMYCIN 250 MG TABLET: 250 | 4 days supply | Qty: 4 | Fill #0

## 2017-03-13 MED FILL — FLUCONAZOLE 150 MG TABLET: 150 | 1 days supply | Qty: 1 | Fill #0

## 2017-03-13 NOTE — ED Triage Notes (Addendum)
c/o SOB, heaviness to chest x 1 week-worse at night when she lies down-minimal cough, + nasal congestion-NAD-steady gait

## 2017-03-13 NOTE — ED Notes (Signed)
Pt on monitor and states she started taking Buspirone 7.5mg  1 tablet x2 day, 3 weeks on it.

## 2017-03-13 NOTE — Discharge Instructions (Signed)
Please see your family doctor for recheck on your breathing.  Your heart rate was elevated today (sinus tachycardia) - please have this rechecked by your family doctor.  You will need a repeat chest x ray in 3-4 weeks to be sure your pneumonia resolves.

## 2017-03-13 NOTE — ED Provider Notes (Signed)
[facility] DEPT Provider Note   CSN: [id number] Arrival date & time: 03/13/17  1254     History   Chief Complaint Chief Complaint  Patient presents with  . Shortness of Breath    HPI Renee Howell is a 51 y.o. female.  The history is provided by the patient. No language interpreter was used.  Shortness of Breath    Renee Howell is a 51 y.o. female who presents to the Emergency Department complaining of sob.  She reports 1 week of progressive shortness of breath. Symptoms are worse at night and she is waking up short of breath. She has occasional intermittent chest tightness and soreness in her central chest. Symptoms radiate up to her neck. She has a cough that is productive of small amounts of clear sputum. No fevers. She at times has nausea. No abdominal pain, vomiting. No lower extremity swelling or pain. She has no history of DVT. She is a diabetic. She recently did have a long trip to [place].  No recent surgeries.  Past Medical History:  Diagnosis Date  . Allergy   . Diabetes mellitus   . Hypertension   . Kidney infection     Patient Active Problem List   Diagnosis Date Noted  . Type 2 diabetes mellitus without complication, with long-term current use of insulin ([place])   . Hypokalemia 12/08/2016  . Sepsis ([place]) 12/08/2016  . Cellulitis 12/08/2016  . Cellulitis of left upper extremity   . Cellulitis of arm, left 12/07/2016  . HTN (hypertension) 12/07/2016  . Diabetes ([place]) 12/07/2016  . Sinus tachycardia 12/07/2016    Past Surgical History:  Procedure Laterality Date  . ABDOMINAL HYSTERECTOMY    . CESAREAN SECTION    . EYE SURGERY    . SHOULDER OPEN ROTATOR CUFF REPAIR  1988    OB History    No data available       Home Medications    Prior to Admission medications   Medication Sig Start Date End Date Taking? Authorizing Provider  BusPIRone HCl (BUSPAR PO) Take by mouth.   Yes Historical Provider, MD  Exenatide (BYDUREON [place])  Inject into the skin.   Yes Historical Provider, MD  insulin lispro (HUMALOG) 100 UNIT/ML injection Inject into the skin 3 (three) times daily before meals.   Yes Historical Provider, MD  amphetamine-dextroamphetamine (ADDERALL XR) 20 MG 24 hr capsule Take 20 mg by mouth daily.    Historical Provider, MD  amphetamine-dextroamphetamine (ADDERALL) 10 MG tablet Take 10 mg by mouth daily with breakfast.    Historical Provider, MD  atorvastatin (LIPITOR) 10 MG tablet Take 10 mg by mouth daily.    Historical Provider, MD  azithromycin (ZITHROMAX) 250 MG tablet Take 1 tablet (250 mg total) by mouth daily. 03/14/17   Quintella Reichert, MD  cholecalciferol (VITAMIN D) 1000 units tablet Take 2,000 Units by mouth daily.    Historical Provider, MD  DULoxetine (CYMBALTA) 30 MG capsule Take 90 mg by mouth daily.    Historical Provider, MD  glyBURIDE (DIABETA) 5 MG tablet Take 5 mg by mouth 2 (two) times daily.     Historical Provider, MD  ibuprofen (ADVIL,MOTRIN) 100 MG tablet Take 100 mg by mouth every 4 (four) hours as needed for pain.     Historical Provider, MD  insulin detemir (LEVEMIR) 100 UNIT/ML injection Inject 35 Units into the skin 2 (two) times daily.     Historical Provider, MD  metFORMIN (GLUCOPHAGE) 1000 MG tablet Take 1,000 mg by mouth  2 (two) times daily.    Historical Provider, MD    Family History Family History  Problem Relation Age of Onset  . Cancer Mother   . Diabetes Father     Social History Social History  Substance Use Topics  . Smoking status: Never Smoker  . Smokeless tobacco: Never Used  . Alcohol use Yes     Comment: occ     Allergies   Trulicity [dulaglutide] and Latex   Review of Systems Review of Systems  Respiratory: Positive for shortness of breath.   All other systems reviewed and are negative.    Physical Exam Updated Vital Signs BP 129/87   Pulse (!) 103   Temp 98.2 F (36.8 C) (Oral)   Resp 16   Ht 5\' 6"  (1.676 m)   Wt 209 lb (94.8 kg)   SpO2  100%   BMI 33.73 kg/m   Physical Exam  Constitutional: She is oriented to person, place, and time. She appears well-developed and well-nourished.  HENT:  Head: Normocephalic and atraumatic.  Cardiovascular: Regular rhythm.   No murmur heard. tachcycardic   Pulmonary/Chest: Effort normal and breath sounds normal. No respiratory distress.  Abdominal: Soft. There is no tenderness. There is no rebound and no guarding.  Musculoskeletal: She exhibits no edema or tenderness.  Neurological: She is alert and oriented to person, place, and time.  Skin: Skin is warm and dry.  Psychiatric: She has a normal mood and affect. Her behavior is normal.  Nursing note and vitals reviewed.    ED Treatments / Results  Labs (all labs ordered are listed, but only abnormal results are displayed) Labs Reviewed  COMPREHENSIVE METABOLIC PANEL - Abnormal; Notable for the following:       Result Value   Glucose, Bld 313 (*)    All other components within normal limits  CBC WITH DIFFERENTIAL/PLATELET  TROPONIN I  D-DIMER, QUANTITATIVE (NOT AT [facility])    EKG  EKG Interpretation  Date/Time:  Monday March 13 2017 13:18:40 EDT Ventricular Rate:  117 PR Interval:  150 QRS Duration: 86 QT Interval:  346 QTC Calculation: 482 R Axis:   72 Text Interpretation:  Sinus tachycardia Possible Left atrial enlargement Septal infarct , age undetermined Abnormal ECG Confirmed by Hazle Coca [phone]) on 03/13/2017 2:56:59 PM       Radiology Dg Chest 2 View  Result Date: 03/13/2017 CLINICAL DATA:  Shortness of breath and chest heaviness for the past week without known injury or trauma. Nonsmoker. History of diabetes and hypertension EXAM: CHEST  2 VIEW COMPARISON:  None in PACs FINDINGS: The lungs are adequately inflated. There are coarse lung markings in the infrahilar region on the right likely in the lower lobe. The left lung is fairly clear. The heart and pulmonary vascularity are normal. The mediastinum is normal  in width. There is no pleural effusion. The bony thorax is unremarkable. IMPRESSION: Atelectasis or developing pneumonia in the right lower lobe. Followup PA and lateral chest X-ray is recommended in 3-4 weeks following trial of antibiotic therapy to ensure resolution and exclude underlying malignancy. Electronically Signed   By: David  Martinique M.D.   On: 03/13/2017 13:35    Procedures Procedures (including critical care time)  Medications Ordered in ED Medications  azithromycin (ZITHROMAX) tablet 500 mg (500 mg Oral Given 03/13/17 1603)     Initial Impression / Assessment and Plan / ED Course  I have reviewed the triage vital signs and the nursing notes.  Pertinent labs & imaging results  that were available during my care of the patient were reviewed by me and considered in my medical decision making (see chart for details).     Patient here for 1 week of progressive shortness of breath. She is tachycardic on examination but clear lungs with no respiratory distress. On record review she  appears to have sinus tachycardia on ED visits and hospitalizations. Chest x-ray concerning for possible pneumonia, will treat with antibiotics. Presentation is not consistent with PE, CHF, ACS.  Counseled pt on homecare, outpatient follow up and return precautions.    Final Clinical Impressions(s) / ED Diagnoses   Final diagnoses:  Shortness of breath  Community acquired pneumonia of right lower lobe of lung ([place])    New Prescriptions Discharge Medication List as of 03/13/2017  4:14 PM    START taking these medications   Details  azithromycin (ZITHROMAX) 250 MG tablet Take 1 tablet (250 mg total) by mouth daily., Starting Tue 03/14/2017, Print         Quintella Reichert, MD 03/14/17 2240

## 2017-03-13 NOTE — ED Notes (Signed)
Patient ambulated one lap around department. SAT 99-100% entire time. HR 119. MD aware

## 2017-05-11 ENCOUNTER — Other Ambulatory Visit: Payer: Self-pay | Admitting: Family Medicine

## 2017-05-11 DIAGNOSIS — R0602 Shortness of breath: Secondary | ICD-10-CM

## 2017-05-17 ENCOUNTER — Telehealth: Payer: Self-pay | Admitting: Internal Medicine

## 2017-05-17 NOTE — Telephone Encounter (Signed)
lmtcb x1 for pt. Per MW, we can use a slot on 05/25/17.

## 2017-05-17 NOTE — Telephone Encounter (Signed)
Pt was scheduled for 05/25/17 @ 2:30...ert

## 2017-05-18 ENCOUNTER — Institutional Professional Consult (permissible substitution): Payer: Managed Care, Other (non HMO) | Admitting: Internal Medicine

## 2017-05-22 ENCOUNTER — Other Ambulatory Visit: Payer: Self-pay | Admitting: Cardiology

## 2017-05-22 ENCOUNTER — Other Ambulatory Visit: Payer: Self-pay

## 2017-05-22 ENCOUNTER — Inpatient Hospital Stay (HOSPITAL_COMMUNITY): Payer: Managed Care, Other (non HMO)

## 2017-05-22 ENCOUNTER — Encounter: Payer: Self-pay | Admitting: Cardiology

## 2017-05-22 ENCOUNTER — Ambulatory Visit (HOSPITAL_BASED_OUTPATIENT_CLINIC_OR_DEPARTMENT_OTHER): Payer: Managed Care, Other (non HMO)

## 2017-05-22 ENCOUNTER — Encounter (HOSPITAL_COMMUNITY): Payer: Self-pay

## 2017-05-22 ENCOUNTER — Encounter (HOSPITAL_COMMUNITY): Payer: Self-pay | Admitting: Cardiology

## 2017-05-22 ENCOUNTER — Inpatient Hospital Stay (HOSPITAL_COMMUNITY)
Admission: AD | Admit: 2017-05-22 | Discharge: 2017-05-29 | DRG: 287 | Disposition: A | Discharge status: Home / Self Care | Payer: Managed Care, Other (non HMO) | Source: Ambulatory Visit | Attending: Cardiology | Admitting: Cardiology

## 2017-05-22 ENCOUNTER — Ambulatory Visit (INDEPENDENT_AMBULATORY_CARE_PROVIDER_SITE_OTHER): Payer: Managed Care, Other (non HMO) | Admitting: Cardiology

## 2017-05-22 VITALS — BP 110/90 | HR 124

## 2017-05-22 DIAGNOSIS — E119 Type 2 diabetes mellitus without complications: Secondary | ICD-10-CM

## 2017-05-22 DIAGNOSIS — I5082 Biventricular heart failure: Secondary | ICD-10-CM | POA: Diagnosis present

## 2017-05-22 DIAGNOSIS — E1165 Type 2 diabetes mellitus with hyperglycemia: Secondary | ICD-10-CM | POA: Diagnosis present

## 2017-05-22 DIAGNOSIS — I5021 Acute systolic (congestive) heart failure: Secondary | ICD-10-CM | POA: Diagnosis present

## 2017-05-22 DIAGNOSIS — Z833 Family history of diabetes mellitus: Secondary | ICD-10-CM

## 2017-05-22 DIAGNOSIS — Z888 Allergy status to other drugs, medicaments and biological substances status: Secondary | ICD-10-CM

## 2017-05-22 DIAGNOSIS — G4733 Obstructive sleep apnea (adult) (pediatric): Secondary | ICD-10-CM | POA: Diagnosis present

## 2017-05-22 DIAGNOSIS — F988 Other specified behavioral and emotional disorders with onset usually occurring in childhood and adolescence: Secondary | ICD-10-CM | POA: Diagnosis present

## 2017-05-22 DIAGNOSIS — I083 Combined rheumatic disorders of mitral, aortic and tricuspid valves: Secondary | ICD-10-CM

## 2017-05-22 DIAGNOSIS — I428 Other cardiomyopathies: Secondary | ICD-10-CM | POA: Diagnosis present

## 2017-05-22 DIAGNOSIS — R000 Tachycardia, unspecified: Secondary | ICD-10-CM | POA: Diagnosis present

## 2017-05-22 DIAGNOSIS — I313 Pericardial effusion (noninflammatory): Secondary | ICD-10-CM

## 2017-05-22 DIAGNOSIS — I420 Dilated cardiomyopathy: Secondary | ICD-10-CM

## 2017-05-22 DIAGNOSIS — Z8701 Personal history of pneumonia (recurrent): Secondary | ICD-10-CM | POA: Diagnosis not present

## 2017-05-22 DIAGNOSIS — R0602 Shortness of breath: Secondary | ICD-10-CM | POA: Diagnosis not present

## 2017-05-22 DIAGNOSIS — I110 Hypertensive heart disease with heart failure: Principal | ICD-10-CM | POA: Diagnosis present

## 2017-05-22 DIAGNOSIS — I10 Essential (primary) hypertension: Secondary | ICD-10-CM

## 2017-05-22 DIAGNOSIS — Z794 Long term (current) use of insulin: Secondary | ICD-10-CM

## 2017-05-22 DIAGNOSIS — I509 Heart failure, unspecified: Secondary | ICD-10-CM | POA: Diagnosis present

## 2017-05-22 DIAGNOSIS — Z91040 Latex allergy status: Secondary | ICD-10-CM | POA: Diagnosis not present

## 2017-05-22 DIAGNOSIS — Z6834 Body mass index (BMI) 34.0-34.9, adult: Secondary | ICD-10-CM | POA: Diagnosis not present

## 2017-05-22 DIAGNOSIS — I959 Hypotension, unspecified: Secondary | ICD-10-CM | POA: Diagnosis present

## 2017-05-22 DIAGNOSIS — I5041 Acute combined systolic (congestive) and diastolic (congestive) heart failure: Secondary | ICD-10-CM

## 2017-05-22 DIAGNOSIS — I429 Cardiomyopathy, unspecified: Secondary | ICD-10-CM | POA: Diagnosis not present

## 2017-05-22 LAB — COMPREHENSIVE METABOLIC PANEL
ALK PHOS: 113 U/L (ref 38–126)
ALT: 63 U/L — AB (ref 14–54)
AST: 45 U/L — ABNORMAL HIGH (ref 15–41)
Albumin: 3.5 g/dL (ref 3.5–5.0)
Anion gap: 7 (ref 5–15)
BILIRUBIN TOTAL: 0.8 mg/dL (ref 0.3–1.2)
BUN: 23 mg/dL — ABNORMAL HIGH (ref 6–20)
CALCIUM: 9.2 mg/dL (ref 8.9–10.3)
CHLORIDE: 106 mmol/L (ref 101–111)
CO2: 24 mmol/L (ref 22–32)
CREATININE: 1.05 mg/dL — AB (ref 0.44–1.00)
GFR, EST NON AFRICAN AMERICAN: 60 mL/min — AB (ref >60)
Glucose, Bld: 168 mg/dL — ABNORMAL HIGH (ref 65–99)
Potassium: 4.4 mmol/L (ref 3.5–5.1)
Sodium: 137 mmol/L (ref 135–145)
TOTAL PROTEIN: 6.2 g/dL — AB (ref 6.5–8.1)

## 2017-05-22 LAB — GLUCOSE, CAPILLARY
GLUCOSE-CAPILLARY: 198 mg/dL — AB (ref 65–99)
Glucose-Capillary: 246 mg/dL — ABNORMAL HIGH (ref 65–99)

## 2017-05-22 LAB — PROTIME-INR
INR: 1.1
PROTHROMBIN TIME: 14.3 s (ref 11.4–15.2)

## 2017-05-22 LAB — DIFFERENTIAL
BASOS ABS: 0 10*3/uL (ref 0.0–0.1)
Basophils Relative: 0 %
EOS ABS: 0.1 10*3/uL (ref 0.0–0.7)
Eosinophils Relative: 1 %
LYMPHS ABS: 3.3 10*3/uL (ref 0.7–4.0)
Lymphocytes Relative: 29 %
MONO ABS: 1 10*3/uL (ref 0.1–1.0)
MONOS PCT: 8 %
Neutro Abs: 6.9 10*3/uL (ref 1.7–7.7)
Neutrophils Relative %: 62 %

## 2017-05-22 LAB — BRAIN NATRIURETIC PEPTIDE
B Natriuretic Peptide: 2063.3 pg/mL — ABNORMAL HIGH (ref 0.0–100.0)
B Natriuretic Peptide: 2030.5 pg/mL — ABNORMAL HIGH (ref 0.0–100.0)

## 2017-05-22 LAB — TROPONIN I: Troponin I: 0.03 ng/mL (ref <0.03)

## 2017-05-22 LAB — MAGNESIUM: MAGNESIUM: 1.6 mg/dL — AB (ref 1.7–2.4)

## 2017-05-22 LAB — CBC
HCT: 38.6 % (ref 36.0–46.0)
Hemoglobin: 12.3 g/dL (ref 12.0–15.0)
MCH: 27.3 pg (ref 26.0–34.0)
MCHC: 31.9 g/dL (ref 30.0–36.0)
MCV: 85.8 fL (ref 78.0–100.0)
PLATELETS: 327 10*3/uL (ref 150–400)
RBC: 4.5 MIL/uL (ref 3.87–5.11)
RDW: 13.9 % (ref 11.5–15.5)
WBC: 11 10*3/uL — ABNORMAL HIGH (ref 4.0–10.5)

## 2017-05-22 LAB — TSH: TSH: 2.192 u[IU]/mL (ref 0.350–4.500)

## 2017-05-22 MED ORDER — GLYBURIDE 5 MG PO TABS: 5.0000 mg | ORAL_TABLET | Freq: Two times a day (BID) | ORAL | Status: DC

## 2017-05-22 MED ORDER — SPIRONOLACTONE 25 MG PO TABS
12.5000 mg | ORAL_TABLET | Freq: Every day | ORAL | Status: DC
  Administered 2017-05-22: 12.5 mg via ORAL
  Filled 2017-05-22: qty 1

## 2017-05-22 MED ORDER — DULOXETINE HCL 60 MG PO CPEP
90.0000 mg | ORAL_CAPSULE | Freq: Every day | ORAL | Status: DC
  Administered 2017-05-24 – 2017-05-29 (×6): 90 mg via ORAL
  Filled 2017-05-22 (×6): qty 1

## 2017-05-22 MED ORDER — HEPARIN SODIUM (PORCINE) 5000 UNIT/ML IJ SOLN
5000.0000 [IU] | Freq: Three times a day (TID) | INTRAMUSCULAR | Status: DC
  Administered 2017-05-23 – 2017-05-29 (×17): 5000 [IU] via SUBCUTANEOUS
  Filled 2017-05-22 (×18): qty 1

## 2017-05-22 MED ORDER — ATORVASTATIN CALCIUM 10 MG PO TABS
10.0000 mg | ORAL_TABLET | Freq: Every day | ORAL | Status: DC
  Administered 2017-05-23 – 2017-05-29 (×7): 10 mg via ORAL
  Filled 2017-05-22 (×7): qty 1

## 2017-05-22 MED ORDER — DIGOXIN 125 MCG PO TABS
0.1250 mg | ORAL_TABLET | Freq: Every day | ORAL | Status: DC
  Administered 2017-05-23 – 2017-05-29 (×8): 0.125 mg via ORAL
  Filled 2017-05-22 (×8): qty 1

## 2017-05-22 MED ORDER — SODIUM CHLORIDE 0.9% FLUSH: 3.0000 mL | INTRAVENOUS | Status: DC | PRN

## 2017-05-22 MED ORDER — ACETAMINOPHEN 325 MG PO TABS
650.0000 mg | ORAL_TABLET | ORAL | Status: DC | PRN
  Administered 2017-05-23: 650 mg via ORAL
  Filled 2017-05-22: qty 2

## 2017-05-22 MED ORDER — INSULIN ASPART 100 UNIT/ML ~~LOC~~ SOLN
0.0000 [IU] | Freq: Every day | SUBCUTANEOUS | Status: DC
  Administered 2017-05-22 – 2017-05-24 (×2): 2 [IU] via SUBCUTANEOUS
  Administered 2017-05-25 – 2017-05-26 (×2): 3 [IU] via SUBCUTANEOUS
  Administered 2017-05-28: 2 [IU] via SUBCUTANEOUS

## 2017-05-22 MED ORDER — INSULIN ASPART 100 UNIT/ML ~~LOC~~ SOLN
0.0000 [IU] | Freq: Three times a day (TID) | SUBCUTANEOUS | Status: DC
  Administered 2017-05-23: 2 [IU] via SUBCUTANEOUS
  Administered 2017-05-23: 1 [IU] via SUBCUTANEOUS
  Administered 2017-05-24: 2 [IU] via SUBCUTANEOUS
  Administered 2017-05-24: 3 [IU] via SUBCUTANEOUS
  Administered 2017-05-24: 2 [IU] via SUBCUTANEOUS
  Administered 2017-05-25 (×2): 5 [IU] via SUBCUTANEOUS
  Administered 2017-05-25: 2 [IU] via SUBCUTANEOUS
  Administered 2017-05-26 (×2): 3 [IU] via SUBCUTANEOUS
  Administered 2017-05-27: 5 [IU] via SUBCUTANEOUS
  Administered 2017-05-27: 3 [IU] via SUBCUTANEOUS
  Administered 2017-05-27: 7 [IU] via SUBCUTANEOUS
  Administered 2017-05-28: 3 [IU] via SUBCUTANEOUS
  Administered 2017-05-28: 5 [IU] via SUBCUTANEOUS
  Administered 2017-05-28 – 2017-05-29 (×3): 3 [IU] via SUBCUTANEOUS

## 2017-05-22 MED ORDER — ASPIRIN EC 81 MG PO TBEC
81.0000 mg | DELAYED_RELEASE_TABLET | Freq: Every day | ORAL | Status: DC
  Administered 2017-05-23 – 2017-05-29 (×8): 81 mg via ORAL
  Filled 2017-05-22 (×8): qty 1

## 2017-05-22 MED ORDER — SODIUM CHLORIDE 0.9% FLUSH
3.0000 mL | Freq: Two times a day (BID) | INTRAVENOUS | Status: DC
  Administered 2017-05-23 – 2017-05-28 (×9): 3 mL via INTRAVENOUS

## 2017-05-22 MED ORDER — ONDANSETRON HCL 4 MG/2ML IJ SOLN: 4.0000 mg | Freq: Four times a day (QID) | INTRAMUSCULAR | Status: DC | PRN

## 2017-05-22 MED ORDER — FUROSEMIDE 10 MG/ML IJ SOLN
40.0000 mg | Freq: Two times a day (BID) | INTRAMUSCULAR | Status: DC
  Administered 2017-05-22 – 2017-05-24 (×5): 40 mg via INTRAVENOUS
  Filled 2017-05-22 (×6): qty 4

## 2017-05-22 MED ORDER — SODIUM CHLORIDE 0.9 % IV SOLN
250.0000 mL | INTRAVENOUS | Status: DC | PRN
  Administered 2017-05-22: 250 mL via INTRAVENOUS

## 2017-05-22 MED ORDER — MILRINONE LACTATE IN DEXTROSE 20-5 MG/100ML-% IV SOLN
0.2500 ug/kg/min | INTRAVENOUS | Status: DC
  Administered 2017-05-22 – 2017-05-23 (×3): 0.25 ug/kg/min via INTRAVENOUS
  Filled 2017-05-22 (×4): qty 100

## 2017-05-22 MED ORDER — INSULIN DETEMIR 100 UNIT/ML ~~LOC~~ SOLN: 20.0000 [IU] | Freq: Two times a day (BID) | SUBCUTANEOUS | Status: DC

## 2017-05-22 MED ORDER — ALPRAZOLAM 0.25 MG PO TABS
0.2500 mg | ORAL_TABLET | Freq: Two times a day (BID) | ORAL | Status: DC | PRN
  Administered 2017-05-22 – 2017-05-29 (×3): 0.25 mg via ORAL
  Filled 2017-05-22 (×2): qty 1

## 2017-05-22 NOTE — Progress Notes (Signed)
Renee Howell presented for echocardiogram this afternoon for shortness of breath. Images showed an EF of 10-15%. She was very short of breath as I did the exam. DOD (Dr. Rayann Heman) was notified and he is seeing her now.  Wyatt Mage, [place] 05/22/2017

## 2017-05-22 NOTE — H&P (Signed)
Advanced HF Team H&P    Date:  05/22/2017   ID:  Renee Howell, DOB December 09, 1965, MRN [id number]  PCP:  Renee Bayley, DO             Cardiologist:  new Renee Howell) Primary Electrophysiologist:  Renee Meredith Leeds, MD           Chief Complaint  Patient presents with  . New Patient (Initial Visit)    CHF     History of Present Illness: Renee Howell is a 51 y.o. female with a h/o obesity, HTN. ADD and poorly-controlled DM2 (HgB a1c ~10 but getting better) who is being admitted from Dr. Curt Howell office for a new diagnosis of acute systolic HF with EF 65% by echo today.   She was referred by Dr. Marin Comment.   She's been having significant issues with her breathing over the past 6 months. Initially sought attention in January due to cough an SOB. Told she had a "spot" in right lung and treated for PNA. Denies fevers, chills or greenish sputum at that time. Then developed hematuria and treated for UTI. More recently had upper extremity cellulitis and treated again with abx,   Over past few weeks has had progressive SOB to the point where she is unable to perform ADLs, also severe orthopnea and PND with 20 pound weight gain. Apparently has hasd 2 recent chest CTs (one at [facility] and one at a [facility] - not in Care everywhere). Told she had spot in her right lung but doesn't know details.   Went to see Dr. Curt Howell today for evaluation of tachycardia and found to be in acute HF with LVEF 15%, moderate to severe RV dysfunction and moderate to severe MR. Referred for admission.  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 Has sleep study many years ago with mild OSA but husband says it is worse now. Drinks rarely. No drug use. She has not had much in the way of chest pain, though a few months ago, did have 2-5 minutes of chest pain associated with pressure in the center of her chest that went away on its own.   Review of Systems: [y] = yes, [ ]  = no     General: Weight gain [y]; Weight loss [ ] ; Anorexia [ ] ; Fatigue [y]; Fever [ ] ; Chills [ ] ; Weakness Blue.Reese ]   Cardiac: Chest pain/pressure [ y]; Resting SOB [ y]; Exertional SOB [y]; Orthopnea Blue.Reese ]; Pedal Edema [y]; Palpitations Blue.Reese ]; Syncope [ ] ; Presyncope [ ] ; Paroxysmal nocturnal dyspnea[y ]   Pulmonary: Cough [ ] ; Wheezing[ ] ; Hemoptysis[ ] ; Sputum [ ] ; Snoring [ ]    GI: Vomiting[ ] ; Dysphagia[ ] ; Melena[ ] ; Hematochezia [ ] ; Heartburn[ ] ; Abdominal pain [ ] ; Constipation [ ] ; Diarrhea [ ] ; BRBPR [ ]    GU: Hematuria[ ] ; Dysuria [ ] ; Nocturia[ ]   Vascular: Pain in legs with walking [ ] ; Pain in feet with lying flat [ ] ; Non-healing sores [ ] ; Stroke [ ] ; TIA [ ] ; Slurred speech [ ] ;   Neuro: Headaches[ ] ; Vertigo[ ] ; Seizures[ ] ; Paresthesias[ ] ;Blurred vision [ ] ; Diplopia [ ] ; Vision changes [ ]    Ortho/Skin: Arthritis [] ; Joint pain [] ; Muscle pain [ ] ; Joint swelling [ ] ; Back Pain [ ] ; Rash [ ]    Psych: Depression[ ] ; Anxiety[ y]   Heme: Bleeding problems [ ] ; Clotting disorders [ ] ; Anemia [ ]    Endocrine: Diabetes [ y]; Thyroid dysfunction[ ]   Past Medical History:  Diagnosis Date  . Allergy   . Diabetes mellitus   . Hypertension   . Kidney infection         Past Surgical History:  Procedure Laterality Date  . ABDOMINAL HYSTERECTOMY    . CESAREAN SECTION    . EYE SURGERY    . SHOULDER OPEN ROTATOR CUFF REPAIR  1988           Current Outpatient Prescriptions  Medication Sig Dispense Refill  . amphetamine-dextroamphetamine (ADDERALL XR) 20 MG 24 hr capsule Take 20 mg by mouth daily.    Marland Kitchen amphetamine-dextroamphetamine (ADDERALL) 10 MG tablet Take 10 mg by mouth daily with breakfast.    . atorvastatin (LIPITOR) 10 MG tablet Take 10 mg by mouth daily.    Marland Kitchen azithromycin (ZITHROMAX) 250 MG tablet Take 1 tablet (250 mg total) by mouth daily. 4 tablet 0  . BusPIRone HCl (BUSPAR PO) Take by mouth.    . cholecalciferol  (VITAMIN D) 1000 units tablet Take 2,000 Units by mouth daily.    . DULoxetine (CYMBALTA) 30 MG capsule Take 90 mg by mouth daily.    . Exenatide (BYDUREON [place]) Inject into the skin.    Marland Kitchen glyBURIDE (DIABETA) 5 MG tablet Take 5 mg by mouth 2 (two) times daily.     Marland Kitchen ibuprofen (ADVIL,MOTRIN) 100 MG tablet Take 100 mg by mouth every 4 (four) hours as needed for pain.     Marland Kitchen insulin detemir (LEVEMIR) 100 UNIT/ML injection Inject 35 Units into the skin 2 (two) times daily.     . insulin lispro (HUMALOG) 100 UNIT/ML injection Inject into the skin 3 (three) times daily before meals.    . metFORMIN (GLUCOPHAGE) 1000 MG tablet Take 1,000 mg by mouth 2 (two) times daily.     No current facility-administered medications for this visit.     Allergies:   Trulicity [dulaglutide] and Latex   Social History:  The patient  reports that she has never smoked. She has never used smokeless tobacco. She reports that she drinks alcohol. She reports that she does not use drugs.   Family History:  The patient's family history includes Cancer in her mother; Diabetes in her father. No family h/o familial CM or SCD,      PHYSICAL EXAM:   Vitals:   05/22/17 1750  BP: 125/80  Pulse: (!) 110  Resp: 18  Temp: 97.7 F (36.5 C)  TempSrc: Oral  SpO2: 100%  Weight: 98.6 kg (217 lb 6.4 oz)  Height: 5\' 6"  (1.676 m)    VS:  BP 110/90   Pulse (!) 124 GEN: Sitting up in bed. Tachypneic at res HEENT: normal  Neck: JVP to ear , carotid bruits, or masses Cardiac: PMI laterally displaced Tachycardic, regular; prominent s3. 2/6 MR Respiratory:  Crackles at the bases bilaterally, GI: obese soft, nontender, nondistended, + BS Extremities: no cyanosis, clubbing, rash, 2-3+ edema. Cool slightly mottled  Neuro: alert & oriented x 3, cranial nerves grossly intact. moves all 4 extremities w/o difficulty. Affect pleasant   EKG:  Sinus tach 124. RAD.  QRS 35ms No ST-T wave abnormalities. Personally  reviewed    Recent Labs: 03/13/2017: ALT 18; BUN 17; Creatinine, Ser 0.79; Hemoglobin 13.5; Platelets 246; Potassium 4.5; Sodium 138    Lipid Panel  Labs(Brief)  No results found for: CHOL, TRIG, HDL, CHOLHDL, VLDL, LDLCALC, LDLDIRECT        Wt Readings from Last 3 Encounters:  03/13/17 209 lb (94.8 kg)  12/07/16  199 lb 14.4 oz (90.7 kg)  09/06/16 215 lb (97.5 kg)      Other studies Reviewed: Additional studies/ records that were reviewed today include: TTE 05/22/17 - personally reviewed  Review of the above records today demonstrates:  EF 15%, LVIDd 5.8cm  Moderate to severe RV dysfunction  moderate-severe MR   ASSESSMENT AND PLAN:  1.  Acute systolic HF --New diagnosis. NYHA IV --Echo images reviewed personally. EF 15% with severe biventricular dysfunction --On exam appears markedly volume overloaded with low output --Move to SDU --Place PICC --Start milrinone, IV lasix, spiro and digoxin. 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 Renee need cath prior to D/c --Discussed possible need for advanced therapies   2. Type 2 diabetes, poorly controlled --Hold metformin. Cover with insulin --DM teaching consult to be placed  3. Morbid obsity --Has significant volume onboard   4. ADD --has held her stimulants for at least a week. Renee continue to hold for now  5. Probable OSA --Renee need outpatient sleep study  6. Abnormal CXR --husband Renee help [place] track down results of recent CT scans  Glori Bickers, MD  7:39 PM

## 2017-05-22 NOTE — Progress Notes (Signed)
Electrophysiology Office Note   Date:  05/22/2017   ID:  Renee Howell, DOB 05/26/1966, MRN [id number]  PCP:  Renee Bayley, DO  Cardiologist:  none Primary Electrophysiologist:  Will Meredith Leeds, MD    Chief Complaint  Patient presents with  . New Patient (Initial Visit)    CHF     History of Present Illness: Renee Howell is a 51 y.o. female who is being seen today for the evaluation of CHF at the request of Renee Howell, Renee P, DO. Presenting today for electrophysiology evaluation. She's been having significant issues over the past 6 months. She was admitted this past winter with pneumonia and has had multiple courses of antibiotics. She is also had multiple inhalers for her shortness of breath. More recently, she has been sleeping in a recliner, unable to lie flat. She does wake up short of breath while in the recliner. She cannot walk 10 feet without getting short of breath. She has not had much in the way of chest pain, though a few months ago, did have 2-5 minutes of chest pain associated with pressure in the center of her chest that went away on its own. Echocardiogram performed today showed an EF of 15-20% with moderate to severe mitral regurgitation, which is a new diagnosis.   Today, she denies symptoms of palpitations, chest pain, claudication, dizziness, presyncope, syncope, bleeding, or neurologic sequela. The patient is tolerating medications without difficulties.    Past Medical History:  Diagnosis Date  . Allergy   . Diabetes mellitus   . Hypertension   . Kidney infection    Past Surgical History:  Procedure Laterality Date  . ABDOMINAL HYSTERECTOMY    . CESAREAN SECTION    . EYE SURGERY    . SHOULDER OPEN ROTATOR CUFF REPAIR  1988     Current Outpatient Prescriptions  Medication Sig Dispense Refill  . amphetamine-dextroamphetamine (ADDERALL XR) 20 MG 24 hr capsule Take 20 mg by mouth daily.    Marland Kitchen amphetamine-dextroamphetamine (ADDERALL) 10 MG tablet  Take 10 mg by mouth daily with breakfast.    . atorvastatin (LIPITOR) 10 MG tablet Take 10 mg by mouth daily.    Marland Kitchen azithromycin (ZITHROMAX) 250 MG tablet Take 1 tablet (250 mg total) by mouth daily. 4 tablet 0  . BusPIRone HCl (BUSPAR PO) Take by mouth.    . cholecalciferol (VITAMIN D) 1000 units tablet Take 2,000 Units by mouth daily.    . DULoxetine (CYMBALTA) 30 MG capsule Take 90 mg by mouth daily.    . Exenatide (BYDUREON [place]) Inject into the skin.    Marland Kitchen glyBURIDE (DIABETA) 5 MG tablet Take 5 mg by mouth 2 (two) times daily.     Marland Kitchen ibuprofen (ADVIL,MOTRIN) 100 MG tablet Take 100 mg by mouth every 4 (four) hours as needed for pain.     Marland Kitchen insulin detemir (LEVEMIR) 100 UNIT/ML injection Inject 35 Units into the skin 2 (two) times daily.     . insulin lispro (HUMALOG) 100 UNIT/ML injection Inject into the skin 3 (three) times daily before meals.    . metFORMIN (GLUCOPHAGE) 1000 MG tablet Take 1,000 mg by mouth 2 (two) times daily.     No current facility-administered medications for this visit.     Allergies:   Trulicity [dulaglutide] and Latex   Social History:  The patient  reports that she has never smoked. She has never used smokeless tobacco. She reports that she drinks alcohol. She reports that she does not use  drugs.   Family History:  The patient's family history includes Cancer in her mother; Diabetes in her father.    ROS:  Please see the history of present illness.   Otherwise, review of systems is positive for PND, orthopnea, dyspnea on exertion.   All other systems are reviewed and negative.    PHYSICAL EXAM: VS:  BP 110/90   Pulse (!) 124  , BMI There is no height or weight on file to calculate BMI. GEN: Well nourished, well developed, in no acute distress  HEENT: normal  Neck: no JVD, carotid bruits, or masses Cardiac: Tachycardic, regular; 7-7/9 systolic murmur at the apex, no rubs, or gallops, 2-3+ edema to the knees Respiratory:  Crackles at the bases bilaterally,  normal work of breathing GI: soft, nontender, nondistended, + BS MS: no deformity or atrophy  Skin: warm and dry Neuro:  Strength and sensation are intact Psych: euthymic mood, full affect  EKG:  EKG is ordered today. Personal review of the ekg ordered shows sinus tachycardia, right axis deviation, rate 124   Recent Labs: 03/13/2017: ALT 18; BUN 17; Creatinine, Ser 0.79; Hemoglobin 13.5; Platelets 246; Potassium 4.5; Sodium 138    Lipid Panel  No results found for: CHOL, TRIG, HDL, CHOLHDL, VLDL, LDLCALC, LDLDIRECT   Wt Readings from Last 3 Encounters:  03/13/17 209 lb (94.8 kg)  12/07/16 199 lb 14.4 oz (90.7 kg)  09/06/16 215 lb (97.5 kg)      Other studies Reviewed: Additional studies/ records that were reviewed today include: TTE 05/22/17 - personally reviewed  Review of the above records today demonstrates:  EF 15-20%, moderate-severe MR   ASSESSMENT AND PLAN:  1.  New onset systolic heart failure: At this time, she is having an acute heart failure exacerbation. She is having sinus tachycardia as well and thus is likely fairly decompensated. I imagine that she has quite a bit of fluid to be diuresed. We'll plan to admit her to the hospital for diuresis. Will put in orders prior to admission for BNP, comprehensive metabolic, CBC, as well as a chest x-ray. She appears quite volume overloaded.  2. Type 2 diabetes: Hemoglobin A1c has been quite elevated in the past consistent with poor control. She is on insulin at home. Will likely need a sliding scale as well as increasing her insulin therapy.  3. Hypertension: Currently well controlled. Will likely need titration of medications for optimal medical therapy for her heart failure.  Current medicines are reviewed at length with the patient today.   The patient does not have concerns regarding her medicines.  The following changes were made today:  none  Labs/ tests ordered today include:  Orders Placed This Encounter    Procedures  . EKG 12-Lead     Disposition:   FU with [facility] post hospitalization  Signed, Will Meredith Leeds, MD  05/22/2017 4:28 PM     [place] [address] [place] [address] [phone] (office) [phone] (fax)

## 2017-05-23 ENCOUNTER — Inpatient Hospital Stay (HOSPITAL_COMMUNITY): Payer: Managed Care, Other (non HMO)

## 2017-05-23 LAB — BASIC METABOLIC PANEL
ANION GAP: 9 (ref 5–15)
BUN: 21 mg/dL — ABNORMAL HIGH (ref 6–20)
CALCIUM: 8.9 mg/dL (ref 8.9–10.3)
CO2: 24 mmol/L (ref 22–32)
CREATININE: 1.05 mg/dL — AB (ref 0.44–1.00)
Chloride: 104 mmol/L (ref 101–111)
GFR, EST NON AFRICAN AMERICAN: 60 mL/min — AB (ref >60)
GLUCOSE: 178 mg/dL — AB (ref 65–99)
Potassium: 3.5 mmol/L (ref 3.5–5.1)
Sodium: 137 mmol/L (ref 135–145)

## 2017-05-23 LAB — HIV ANTIBODY (ROUTINE TESTING W REFLEX): HIV SCREEN 4TH GENERATION: NONREACTIVE

## 2017-05-23 LAB — COOXEMETRY PANEL
Carboxyhemoglobin: 1.4 % (ref 0.5–1.5)
METHEMOGLOBIN: 0.9 % (ref 0.0–1.5)
O2 Saturation: 66.1 %
TOTAL HEMOGLOBIN: 11.2 g/dL — AB (ref 12.0–16.0)

## 2017-05-23 LAB — GLUCOSE, CAPILLARY
GLUCOSE-CAPILLARY: 81 mg/dL (ref 65–99)
GLUCOSE-CAPILLARY: 87 mg/dL (ref 65–99)
GLUCOSE-CAPILLARY: 129 mg/dL — AB (ref 65–99)
GLUCOSE-CAPILLARY: 192 mg/dL — AB (ref 65–99)
Glucose-Capillary: 199 mg/dL — ABNORMAL HIGH (ref 65–99)

## 2017-05-23 LAB — TROPONIN I: TROPONIN I: 0.03 ng/mL — AB (ref <0.03)

## 2017-05-23 LAB — MRSA PCR SCREENING: MRSA BY PCR: NEGATIVE

## 2017-05-23 MED ORDER — FLUTICASONE PROPIONATE 50 MCG/ACT NA SUSP
2.0000 | Freq: Every day | NASAL | Status: DC
  Administered 2017-05-23 – 2017-05-29 (×7): 2 via NASAL
  Filled 2017-05-23: qty 16

## 2017-05-23 MED ORDER — GLYBURIDE 5 MG PO TABS
5.0000 mg | ORAL_TABLET | Freq: Two times a day (BID) | ORAL | Status: DC
  Administered 2017-05-23 – 2017-05-29 (×10): 5 mg via ORAL
  Filled 2017-05-23 (×14): qty 1

## 2017-05-23 MED ORDER — POTASSIUM CHLORIDE CRYS ER 20 MEQ PO TBCR
20.0000 meq | EXTENDED_RELEASE_TABLET | Freq: Two times a day (BID) | ORAL | Status: DC
  Administered 2017-05-23 – 2017-05-26 (×7): 20 meq via ORAL
  Filled 2017-05-23 (×7): qty 1

## 2017-05-23 MED ORDER — SPIRONOLACTONE 25 MG PO TABS
25.0000 mg | ORAL_TABLET | Freq: Every day | ORAL | Status: DC
  Administered 2017-05-23 – 2017-05-29 (×6): 25 mg via ORAL
  Filled 2017-05-23 (×6): qty 1

## 2017-05-23 MED ORDER — SODIUM CHLORIDE 0.9% FLUSH: 10.0000 mL | INTRAVENOUS | Status: DC | PRN

## 2017-05-23 MED ORDER — MAGNESIUM SULFATE 4 GM/100ML IV SOLN
4.0000 g | Freq: Once | INTRAVENOUS | Status: AC
  Administered 2017-05-23: 4 g via INTRAVENOUS
  Filled 2017-05-23: qty 100

## 2017-05-23 MED ORDER — SODIUM CHLORIDE 0.9% FLUSH
10.0000 mL | Freq: Two times a day (BID) | INTRAVENOUS | Status: DC
Start: 2017-05-23 — End: 2017-05-29
  Administered 2017-05-23 – 2017-05-28 (×9): 10 mL

## 2017-05-23 MED ORDER — INSULIN DETEMIR 100 UNIT/ML ~~LOC~~ SOLN
10.0000 [IU] | Freq: Two times a day (BID) | SUBCUTANEOUS | Status: DC
  Administered 2017-05-23 – 2017-05-27 (×7): 10 [IU] via SUBCUTANEOUS
  Filled 2017-05-23 (×8): qty 0.1

## 2017-05-23 MED ORDER — ORAL CARE MOUTH RINSE
15.0000 mL | Freq: Two times a day (BID) | OROMUCOSAL | Status: DC
  Administered 2017-05-24 – 2017-05-29 (×9): 15 mL via OROMUCOSAL

## 2017-05-23 NOTE — Progress Notes (Signed)
Nutrition Education Note  RD consulted for nutrition education regarding new onset CHF. Patient was sleeping, so RD spoke with her husband at bedside.  RD provided "Low Sodium Nutrition Therapy" handout from the Academy of [employer] and Dietetics. Discouraged intake of processed foods and use of salt shaker. Encouraged fresh fruits and vegetables as well as whole grain sources of carbohydrates to maximize fiber intake.   RD discussed why it is important for patient to adhere to diet recommendations, and emphasized the role of fluids, foods to avoid, and importance of weighing self daily. Teach back method used.  Expect good compliance.  Body mass index is 34.19 kg/m. Pt meets criteria for obesity based on current BMI.  Current diet order is CHO modified / 2 gm sodium, patient is consuming approximately 100% of meals at this time. Labs and medications reviewed. No further nutrition interventions warranted at this time. RD contact information provided. If additional nutrition issues arise, please re-consult RD.   Molli Barrows, RD, LDN, [place] Pager [phone] After Hours Pager [phone]

## 2017-05-23 NOTE — Progress Notes (Signed)
Peripherally Inserted Central Catheter/Midline Placement  The IV Nurse has discussed with the patient and/or persons authorized to consent for the patient, the purpose of this procedure and the potential benefits and risks involved with this procedure.  The benefits include less needle sticks, lab draws from the catheter, and the patient may be discharged home with the catheter. Risks include, but not limited to, infection, bleeding, blood clot (thrombus formation), and puncture of an artery; nerve damage and irregular heartbeat and possibility to perform a PICC exchange if needed/ordered by physician.  Alternatives to this procedure were also discussed.  Bard Power PICC patient education guide, fact sheet on infection prevention and patient information card has been provided to patient /or left at bedside.    PICC/Midline Placement Documentation        Renee Howell 05/23/2017, 9:42 AM

## 2017-05-23 NOTE — Plan of Care (Signed)
Problem: Education: Goal: Knowledge of [facility] Education information/materials will improve Outcome: Progressing RN discussed new diagnosis of Heart Failure with patient. Explain plan of care and what to expect with treatment.   Problem: Pain Managment: Goal: General experience of comfort will improve Outcome: Progressing Patient has had no complaints of pain.   Problem: Physical Regulation: Goal: Ability to maintain clinical measurements within normal limits will improve Outcome: Progressing Patient remains on oxygen, is tolerating getting up to the [facility] when using the bathroom.   Problem: Tissue Perfusion: Goal: Risk factors for ineffective tissue perfusion will decrease Outcome: Progressing Milrinone infusing. Most ordered medication were able to be given, some unverified by pharmacy due to not being able to verify scripts with provider.   Problem: Fluid Volume: Goal: Ability to maintain a balanced intake and output will improve Outcome: Progressing Accurate I&O's, currently 2300cc of urine this shift.

## 2017-05-23 NOTE — Progress Notes (Signed)
Advanced Heart Failure Rounding Note  PCP:Le, Thao P, DO Cardiologist:new (Merlean Pizzini) Primary Electrophysiologist:Will Meredith Leeds, MD   Subjective:    Admitted 05/22/17 from Dr. Macky Lower office with acute on chronic systolic CHF, EF 73% with severe biventricular dysfunction. Started on milrinone 0.25 mcg and IV Lasix. Waiting on PICC placement.   Diuresed briskly overnight, weight down 6 pounds. Feels tired this am, denies chest pain. Says her SOB is improving but still feels SOB with moving around in the bed.    Objective:   Weight Range: 211 lb 12.8 oz (96.1 kg) Body mass index is 34.19 kg/m.   Vital Signs:   Temp:  [97.7 F (36.5 C)-98 F (36.7 C)] 98 F (36.7 C) (06/26 0732) Pulse Rate:  [108-118] 111 (06/26 0732) Resp:  [18-23] 18 (06/26 0732) BP: (110-126)/(72-81) 121/73 (06/26 0732) SpO2:  [97 %-100 %] 100 % (06/26 0732) FiO2 (%):  [0 %] 0 % (06/25 2045) Weight:  [211 lb 12.8 oz (96.1 kg)-217 lb 6.4 oz (98.6 kg)] 211 lb 12.8 oz (96.1 kg) (06/26 0655) Last BM Date: 05/21/17  Weight change: Filed Weights   05/22/17 1750 05/23/17 0655  Weight: 217 lb 6.4 oz (98.6 kg) 211 lb 12.8 oz (96.1 kg)    Intake/Output:   Intake/Output Summary (Last 24 hours) at 05/23/17 0820 Last data filed at 05/23/17 0655  Gross per 24 hour  Intake           305.52 ml  Output             3800 ml  Net         -3494.48 ml     Physical Exam: General: fatigued appearing female. NAD.  HEENT: normal Neck: supple. JVP 8-9 cm, hard to assess . Carotids 2+ bilat; no bruits. No lymphadenopathy or thyromegaly appreciated. Cor: PMI nondisplaced. Regular rate & rhythm. + s3. 2/6 SEM  Lungs: clear in upper lobes, fine crackles in bilateral bases.  Abdomen: soft, nontender, nondistended. No hepatosplenomegaly. No bruits or masses. Good bowel sounds. Extremities: no cyanosis, clubbing, rash, edema. Warm. 2+ pedal edema.  Neuro: alert & orientedx3, cranial nerves  grossly intact. moves all 4 extremities w/o difficulty. Affect pleasant   Telemetry: Sinus tach.   Labs: CBC  Recent Labs  05/22/17 2014  WBC 11.0*  NEUTROABS 6.9  HGB 12.3  HCT 38.6  MCV 85.8  PLT 710   Basic Metabolic Panel  Recent Labs  05/22/17 2014 05/22/17 2116 05/23/17 0230  NA 137  --  137  K 4.4  --  3.5  CL 106  --  104  CO2 24  --  24  GLUCOSE 168*  --  178*  BUN 23*  --  21*  CREATININE 1.05*  --  1.05*  CALCIUM 9.2  --  8.9  MG  --  1.6*  --    Liver Function Tests  Recent Labs  05/22/17 2014  AST 45*  ALT 63*  ALKPHOS 113  BILITOT 0.8  PROT 6.2*  ALBUMIN 3.5   No results for input(s): LIPASE, AMYLASE in the last 72 hours. Cardiac Enzymes  Recent Labs  05/22/17 2116 05/23/17 0230  TROPONINI 0.03* 0.03*    BNP: BNP (last 3 results)  Recent Labs  05/22/17 2014 05/22/17 2116  BNP 2,063.3* 2,030.5*    Thyroid Function Tests  Recent Labs  05/22/17 2116  TSH 2.192    Transthoracic Echocardiography 05/22/17 Study Conclusions  - Left ventricle: The cavity size was severely dilated. Systolic  function was severely reduced. The estimated ejection fraction   was in the range of 20% to 25%. - Aortic valve: There was mild regurgitation. - Mitral valve: There was severe regurgitation. - Left atrium: The atrium was moderately dilated. - Right ventricle: Systolic function was moderately reduced. - Tricuspid valve: There was moderate regurgitation. - Pulmonary arteries: PA peak pressure: 40 mm Hg (S). - Pericardium, extracardiac: A trivial pericardial effusion was   identified.     Imaging/Studies:  Dg Chest Port 1 View  Result Date: 05/23/2017 CLINICAL DATA:  Shortness of Breath EXAM: PORTABLE CHEST 1 VIEW COMPARISON:  05/22/2017 FINDINGS: Cardiac shadow is at the upper limits of normal in size. The lungs are well aerated bilaterally. The previously seen vascular congestion has improved in the interval. Minimal atelectatic  changes are noted left base. A small effusion cannot be totally excluded. No bony abnormality is noted. IMPRESSION: Significant improvement in CHF.  Some left basilar changes remain. Electronically Signed   By: Inez Catalina M.D.   On: 05/23/2017 07:24   Dg Chest Port 1 View  Result Date: 05/22/2017 CLINICAL DATA:  Shortness of breath for 3 weeks.  History of CHF. EXAM: PORTABLE CHEST 1 VIEW COMPARISON:  04/05/2017 and prior exams FINDINGS: Cardiomegaly and mild pulmonary vascular congestion noted. There is no evidence of focal airspace disease, pulmonary edema, suspicious pulmonary nodule/mass, pleural effusion, or pneumothorax. No acute bony abnormalities are identified. IMPRESSION: Cardiomegaly with mild pulmonary vascular congestion. Electronically Signed   By: Margarette Canada M.D.   On: 05/22/2017 22:02       Medications:     Scheduled Medications: . aspirin EC  81 mg Oral Daily  . atorvastatin  10 mg Oral Daily  . digoxin  0.125 mg Oral Daily  . DULoxetine  90 mg Oral Daily  . furosemide  40 mg Intravenous BID  . glyBURIDE  5 mg Oral BID  . heparin  5,000 Units Subcutaneous Q8H  . insulin aspart  0-5 Units Subcutaneous QHS  . insulin aspart  0-9 Units Subcutaneous TID WC  . insulin detemir  20 Units Subcutaneous BID  . mouth rinse  15 mL Mouth Rinse BID  . sodium chloride flush  3 mL Intravenous Q12H  . spironolactone  12.5 mg Oral Daily     Infusions: . sodium chloride 250 mL (05/22/17 2239)  . milrinone 0.25 mcg/kg/min (05/22/17 2240)     PRN Medications:  sodium chloride, acetaminophen, ALPRAZolam, ondansetron (ZOFRAN) IV, sodium chloride flush   Assessment/Plan   1. Acute on chronic systolic CHF: Echo EF 61% with biventricular dysfunction. 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 OSA vs. DM. But will need to rule out ischemic cause. She was treated for PNA in January.  - New diagnosis - NYHA IV - Started on milrinone for low output.  - Volume status remains elevated, but improving  with Milrinone and IV Lasix.  - Awaiting PICC placement. Will get CVP and Co ox when PICC confirmed.   - Continue IV lasix 40mg  BID for now. Had 3.8 L out last night on that dose. Will await CVP.  - Increase Spiro to 25 mg daily.  - Continue digoxin 0.125 mg.  - Hold beta blocker with low output.  - Will need to add losartan/Entresto as well.  - Will need sleep study.  - Right and left heart cath once volume is stabilized.  2. Type 2 diabetes, poorly controlled - A1c pending.  - Continue SSI 3. Morbid obsity - Body mass index is 34.19  kg/m. 4. ADD --has held her stimulants for at least a week. Will continue to hold for now - Continue current plan.  5. Probable OSA - According to her husband snoring has worsened over the past year.  - Will need outpatient sleep study.  6. Abnormal CXR - Says she was told there is a "spot" on her right lung.  - No chest CT in CareEverywhere - May need to order chest CT to evaluate.  7. Hypomagnesia - Will supp  Length of Stay: [place], NP  05/23/2017, 8:20 AM  Advanced Heart Failure Team Pager [phone] (M-F; [place])  Please contact [facility] for night-coverage after hours (4p -7a ) and weekends on [email or web address]  Patient seen and examined with Jettie Booze, NP. We discussed all aspects of the encounter. I agree with the assessment and plan as stated above.   Was very tenuous overnight. PICC placed. Co-ox now 66% on milrinone but CVP remains 15. Will continue milrinone and IV lasix. Agree with continuing digoxin and increasing spiro. Likely start Entresto 24/26 bid. Supp K and Mag.   Will need cath prior to d/c.   Glori Bickers, MD  2:45 PM

## 2017-05-24 LAB — GLUCOSE, CAPILLARY
GLUCOSE-CAPILLARY: 160 mg/dL — AB (ref 65–99)
GLUCOSE-CAPILLARY: 220 mg/dL — AB (ref 65–99)
Glucose-Capillary: 159 mg/dL — ABNORMAL HIGH (ref 65–99)
Glucose-Capillary: 202 mg/dL — ABNORMAL HIGH (ref 65–99)

## 2017-05-24 LAB — BASIC METABOLIC PANEL
ANION GAP: 7 (ref 5–15)
BUN: 14 mg/dL (ref 6–20)
CO2: 30 mmol/L (ref 22–32)
Calcium: 8.3 mg/dL — ABNORMAL LOW (ref 8.9–10.3)
Chloride: 101 mmol/L (ref 101–111)
Creatinine, Ser: 1.01 mg/dL — ABNORMAL HIGH (ref 0.44–1.00)
GLUCOSE: 230 mg/dL — AB (ref 65–99)
POTASSIUM: 3.5 mmol/L (ref 3.5–5.1)
Sodium: 138 mmol/L (ref 135–145)

## 2017-05-24 LAB — COOXEMETRY PANEL
CARBOXYHEMOGLOBIN: 1.4 % (ref 0.5–1.5)
METHEMOGLOBIN: 0.9 % (ref 0.0–1.5)
O2 SAT: 63.8 %
Total hemoglobin: 11.6 g/dL — ABNORMAL LOW (ref 12.0–16.0)

## 2017-05-24 LAB — MAGNESIUM: Magnesium: 1.9 mg/dL (ref 1.7–2.4)

## 2017-05-24 MED ORDER — SACUBITRIL-VALSARTAN 24-26 MG PO TABS
1.0000 | ORAL_TABLET | Freq: Two times a day (BID) | ORAL | Status: DC
  Administered 2017-05-24 – 2017-05-29 (×11): 1 via ORAL
  Filled 2017-05-24 (×12): qty 1

## 2017-05-24 MED ORDER — MILRINONE LACTATE IN DEXTROSE 20-5 MG/100ML-% IV SOLN
0.1250 ug/kg/min | INTRAVENOUS | Status: DC
  Administered 2017-05-25 – 2017-05-26 (×2): 0.125 ug/kg/min via INTRAVENOUS
  Filled 2017-05-24 (×2): qty 100

## 2017-05-24 MED ORDER — IVABRADINE HCL 5 MG PO TABS
2.5000 mg | ORAL_TABLET | Freq: Two times a day (BID) | ORAL | Status: DC
  Administered 2017-05-24 – 2017-05-28 (×7): 2.5 mg via ORAL
  Filled 2017-05-24 (×11): qty 1

## 2017-05-24 NOTE — Plan of Care (Signed)
Problem: Activity: Goal: Capacity to carry out activities will improve Outcome: Progressing With diuresis, patient reports being much more comfortable. Has been taken off O2, is able to sleep, gets up by self to bedside commode.   Problem: Cardiac: Goal: Ability to achieve and maintain adequate cardiopulmonary perfusion will improve Outcome: Progressing Milrinone drip maintained. CVP's monitored.

## 2017-05-24 NOTE — Progress Notes (Signed)
Inpatient Diabetes Program Recommendations  AACE/ADA: New Consensus Statement on Inpatient Glycemic Control (2015)  Target Ranges:  Prepandial:   less than 140 mg/dL      Peak postprandial:   less than 180 mg/dL (1-2 hours)      Critically ill patients:  140 - 180 mg/dL   Lab Results  Component Value Date   GLUCAP 220 (H) 05/24/2017    Review of Glycemic Control:  Results for RILEY, PAPIN (MRN [id number]) as of 05/24/2017 13:56  Ref. Range 05/23/2017 16:28 05/23/2017 21:20 05/24/2017 07:29 05/24/2017 11:48  Glucose-Capillary Latest Ref Range: 65 - 99 mg/dL 192 (H) 199 (H) 160 (H) 220 (H)   Diabetes history: Type 2 diabetes Outpatient Diabetes medications: Metformin 1000 bid, Levemir 40 units bid, Humalog 2-8 unit tid with meals, Bydureon 2 mg weekly Current orders for Inpatient glycemic control:  Levemir 10 units bid, Glyburide 5 mg bid, Novolog sensitive tid with meals and HS  Inpatient Diabetes Program Recommendations:   Please consider increasing Levemir to 15 units bid.  Also consider adding Novolog meal coverage 5 units tid with meals.   Thanks, Adah Perl, RN, BC-ADM Inpatient Diabetes Coordinator Pager [phone] (8a-5p)

## 2017-05-24 NOTE — Progress Notes (Signed)
Advanced Heart Failure Rounding Note  PCP:Le, Thao P, DO Cardiologist:new (Tawona Filsinger) Primary Electrophysiologist:Will Meredith Leeds, MD   Subjective:    Admitted 05/22/17 from Dr. Macky Lower office with acute on chronic systolic CHF, EF 85% with severe biventricular dysfunction. Started on milrinone 0.25 mcg and IV Lasix. Co ox 64% today.   Brisk diuresis, weight down 12 pounds. CVP 13.   Feels well this morning. Has questions about heart failure, we talked about her diagnosis. She denies SOB, chest pain. Slept well last night.    Objective:   Weight Range: 205 lb 11.2 oz (93.3 kg) Body mass index is 33.2 kg/m.   Vital Signs:   Temp:  [97.5 F (36.4 C)-98.9 F (37.2 C)] 98.7 F (37.1 C) (06/27 0531) Pulse Rate:  [111-118] 118 (06/27 0531) Resp:  [18-26] 19 (06/27 0531) BP: (102-121)/(54-73) 118/60 (06/27 0531) SpO2:  [94 %-100 %] 94 % (06/27 0531) Weight:  [205 lb 11.2 oz (93.3 kg)] 205 lb 11.2 oz (93.3 kg) (06/27 0531) Last BM Date: 05/21/17  Weight change: Filed Weights   05/22/17 1750 05/23/17 0655 05/24/17 0531  Weight: 217 lb 6.4 oz (98.6 kg) 211 lb 12.8 oz (96.1 kg) 205 lb 11.2 oz (93.3 kg)    Intake/Output:   Intake/Output Summary (Last 24 hours) at 05/24/17 0706 Last data filed at 05/24/17 0555  Gross per 24 hour  Intake          1089.53 ml  Output             6800 ml  Net         -5710.47 ml     Physical Exam: CVP: 13 General:Fatigued appearing female, NAD.  HEENT: Normal Neck: Supple. JVP 9 cm. Carotids 2+ bilat; no bruits. No thyromegaly or nodule noted. Cor: PMI nondisplaced. RRR. +S3. 2/6 SEM.  Lungs: CTAB, normal effort. Abdomen: Soft, non-tender, non-distended, no HSM. No bruits or masses. +BS  Extremities: No cyanosis, clubbing, rash, trace pretibial edema bilaterally. RUE PICC Neuro: Alert & orientedx3, cranial nerves grossly intact. moves all 4 extremities w/o difficulty. Affect pleasant    Telemetry: NSR,  Sinus tach  Labs: CBC  Recent Labs  05/22/17 2014  WBC 11.0*  NEUTROABS 6.9  HGB 12.3  HCT 38.6  MCV 85.8  PLT 462   Basic Metabolic Panel  Recent Labs  05/22/17 2116 05/23/17 0230 05/24/17 0413  NA  --  137 138  K  --  3.5 3.5  CL  --  104 101  CO2  --  24 30  GLUCOSE  --  178* 230*  BUN  --  21* 14  CREATININE  --  1.05* 1.01*  CALCIUM  --  8.9 8.3*  MG 1.6*  --   --    Liver Function Tests  Recent Labs  05/22/17 2014  AST 45*  ALT 63*  ALKPHOS 113  BILITOT 0.8  PROT 6.2*  ALBUMIN 3.5   No results for input(s): LIPASE, AMYLASE in the last 72 hours. Cardiac Enzymes  Recent Labs  05/22/17 2116 05/23/17 0230  TROPONINI 0.03* 0.03*    BNP: BNP (last 3 results)  Recent Labs  05/22/17 2014 05/22/17 2116  BNP 2,063.3* 2,030.5*    Thyroid Function Tests  Recent Labs  05/22/17 2116  TSH 2.192    Transthoracic Echocardiography 05/22/17 Study Conclusions  - Left ventricle: The cavity size was severely dilated. Systolic   function was severely reduced. The estimated ejection fraction   was in the range of  20% to 25%. - Aortic valve: There was mild regurgitation. - Mitral valve: There was severe regurgitation. - Left atrium: The atrium was moderately dilated. - Right ventricle: Systolic function was moderately reduced. - Tricuspid valve: There was moderate regurgitation. - Pulmonary arteries: PA peak pressure: 40 mm Hg (S). - Pericardium, extracardiac: A trivial pericardial effusion was   identified.     Imaging/Studies:  Dg Chest Port 1 View  Result Date: 05/23/2017 CLINICAL DATA:  Shortness of Breath EXAM: PORTABLE CHEST 1 VIEW COMPARISON:  05/22/2017 FINDINGS: Cardiac shadow is at the upper limits of normal in size. The lungs are well aerated bilaterally. The previously seen vascular congestion has improved in the interval. Minimal atelectatic changes are noted left base. A small effusion cannot be totally excluded. No bony  abnormality is noted. IMPRESSION: Significant improvement in CHF.  Some left basilar changes remain. Electronically Signed   By: Inez Catalina M.D.   On: 05/23/2017 07:24      Medications:     Scheduled Medications: . aspirin EC  81 mg Oral Daily  . atorvastatin  10 mg Oral Daily  . digoxin  0.125 mg Oral Daily  . DULoxetine  90 mg Oral Daily  . fluticasone  2 spray Each Nare Daily  . furosemide  40 mg Intravenous BID  . glyBURIDE  5 mg Oral BID WC  . heparin  5,000 Units Subcutaneous Q8H  . insulin aspart  0-5 Units Subcutaneous QHS  . insulin aspart  0-9 Units Subcutaneous TID WC  . insulin detemir  10 Units Subcutaneous BID  . mouth rinse  15 mL Mouth Rinse BID  . potassium chloride  20 mEq Oral BID  . sodium chloride flush  10-40 mL Intracatheter Q12H  . sodium chloride flush  3 mL Intravenous Q12H  . spironolactone  25 mg Oral Daily    Infusions: . sodium chloride 250 mL (05/22/17 2239)  . milrinone 0.25 mcg/kg/min (05/23/17 2330)    PRN Medications: sodium chloride, acetaminophen, ALPRAZolam, ondansetron (ZOFRAN) IV, sodium chloride flush, sodium chloride flush   Assessment/Plan   1. Acute on chronic systolic CHF: Echo EF 58% with biventricular dysfunction. 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 DM. But will need to rule out ischemic cause. She was treated for PNA in January.  - Newly diagnosed - Decrease milrinone to  0.125 mcg today.  - CVP 13, continue IV diuresis today.   - Continue Spiro 25mg  daily.   - Continue digoxin 0.125 mg - Start Entresto 24/26 mg BID 2. Type 2 diabetes, poorly controlled - Continue SSI.  3. Morbid obsity - Body mass index is 33.2 kg/m. 4. ADD - Stimulants on hold.  5. Probable OSA - According to her husband snoring has worsened over the past year.  - Will need outpatient sleep study.  6. Abnormal CXR - Says she was told there is a "spot" on her right lung.  - Chest CT report in chart reviewed, chest CT is normal without  any mention of nodule or mass.  7. Hypomagnesia - got 4 gm of Mg yesterday. Will repeat Mg today.  8. Sinus tach - no beta blocker while decompensated.  - Consider ivabradine prior to dc Length of Stay: [place], NP  05/24/2017, 7:06 AM  Advanced Heart Failure Team Pager [phone] (M-F; 7a - 4p)  Please contact [facility] for night-coverage after hours (4p -7a ) and weekends on [email or web address]  Patient seen and examined with Jettie Booze, NP.  We discussed all aspects of the encounter. I agree with the assessment and plan as stated above.   Improving on milrinone and with IV diuresis but remains extremely tenuous with HRs in 120s at rest. CVP still up.  Agree with adding Entresto today. Can cut back milrinone slightly. Consider adding ivabradine. CR to see.  Discussed possible cath prior to d/c.   Glori Bickers, MD  6:41 PM

## 2017-05-24 NOTE — Progress Notes (Signed)
CARDIAC REHAB PHASE I   PRE:  Rate/Rhythm: 123 ST  BP:  Supine:   Sitting: 110/66  Standing:    SaO2: 99%RA  MODE:  Ambulation: 200 ft   POST:  Rate/Rhythm: 128 ST  BP:  Supine:   Sitting: 99/71  Standing:    SaO2: 98%RA 1500-1524 Pt stated MD wanted her to walk to see how HR responds. Monitored heart rate during walk and highest at 128. No dizziness but did state legs a little weak, a little winded and could feel heart beating a little fast. Back to bed after walk. Has CHF booklet.    Graylon Good, RN BSN  05/24/2017 3:22 PM

## 2017-05-24 NOTE — Plan of Care (Signed)
Problem: Health Behavior/Discharge Planning: Goal: Ability to manage health-related needs will improve Outcome: Progressing Printed education given to pt  Problem: Pain Managment: Goal: General experience of comfort will improve Outcome: Progressing Pt verbalizes that she is breathing much easier with less SOB, able to sleep my comfortably  Problem: Physical Regulation: Goal: Ability to maintain clinical measurements within normal limits will improve Outcome: Progressing Most recent CVP 7

## 2017-05-25 ENCOUNTER — Institutional Professional Consult (permissible substitution): Payer: Managed Care, Other (non HMO) | Admitting: Internal Medicine

## 2017-05-25 LAB — BASIC METABOLIC PANEL
Anion gap: 9 (ref 5–15)
BUN: 14 mg/dL (ref 6–20)
CO2: 29 mmol/L (ref 22–32)
Calcium: 8.3 mg/dL — ABNORMAL LOW (ref 8.9–10.3)
Chloride: 101 mmol/L (ref 101–111)
Creatinine, Ser: 0.94 mg/dL (ref 0.44–1.00)
GFR calc Af Amer: >60 mL/min (ref >60)
GFR calc non Af Amer: >60 mL/min (ref >60)
Glucose, Bld: 197 mg/dL — ABNORMAL HIGH (ref 65–99)
Potassium: 3.6 mmol/L (ref 3.5–5.1)
SODIUM: 139 mmol/L (ref 135–145)

## 2017-05-25 LAB — COOXEMETRY PANEL
Carboxyhemoglobin: 1.2 % (ref 0.5–1.5)
Carboxyhemoglobin: 1.3 % (ref 0.5–1.5)
METHEMOGLOBIN: 0.8 % (ref 0.0–1.5)
METHEMOGLOBIN: 0.8 % (ref 0.0–1.5)
O2 SAT: 61.4 %
O2 Saturation: 56.8 %
TOTAL HEMOGLOBIN: 13.1 g/dL (ref 12.0–16.0)
TOTAL HEMOGLOBIN: 13.7 g/dL (ref 12.0–16.0)

## 2017-05-25 LAB — GLUCOSE, CAPILLARY
GLUCOSE-CAPILLARY: 258 mg/dL — AB (ref 65–99)
GLUCOSE-CAPILLARY: 300 mg/dL — AB (ref 65–99)
GLUCOSE-CAPILLARY: 240 mg/dL — AB (ref 65–99)
Glucose-Capillary: 191 mg/dL — ABNORMAL HIGH (ref 65–99)

## 2017-05-25 MED ORDER — MAGNESIUM HYDROXIDE 400 MG/5ML PO SUSP
30.0000 mL | Freq: Every day | ORAL | Status: DC | PRN
  Administered 2017-05-28: 30 mL via ORAL
  Filled 2017-05-25: qty 30

## 2017-05-25 MED ORDER — MAGNESIUM SULFATE 2 GM/50ML IV SOLN
2.0000 g | Freq: Once | INTRAVENOUS | Status: AC
  Administered 2017-05-25: 2 g via INTRAVENOUS
  Filled 2017-05-25: qty 50

## 2017-05-25 MED ORDER — SODIUM CHLORIDE 0.9 % IV SOLN
INTRAVENOUS | Status: DC
  Administered 2017-05-26: 07:00:00 via INTRAVENOUS

## 2017-05-25 NOTE — Progress Notes (Signed)
I visited patient to review HF Recommendations for home and she is asleep.  Husband asks if I will not awake her and wait for a later time and come back.  I will plan to return to review education on HF.

## 2017-05-25 NOTE — Progress Notes (Signed)
CARDIAC REHAB PHASE I   PRE:  Rate/Rhythm: 112 ST  BP:  Supine:   Sitting: 100/62  Standing:    SaO2: 97%RA  MODE:  Ambulation: 290 ft   POST:  Rate/Rhythm: 113 ST  BP:  Supine:   Sitting: 104/59  Standing:    SaO2: 99%RA 1332-1407 Pt walked 290 ft on RA with asst x 1. No palpitations today when walking. Tolerated well. Reviewed CHF booklet with pt concentrating on signs/symptoms of CHF, when to call MD with weight gain or concerns, 2000 mg sodium restriction. Gave low sodium diets. Discussed CRP 2 and pt would like to attend [place]. Will refer after cath results available.   Graylon Good, RN BSN  05/25/2017 2:11 PM

## 2017-05-25 NOTE — Progress Notes (Signed)
Advanced Heart Failure Rounding Note  PCP:Le, Thao P, DO Cardiologist:new (Bensimhon) Primary Electrophysiologist:Will Meredith Leeds, MD   Subjective:    Admitted 05/22/17 from Dr. Macky Lower office with acute on chronic systolic CHF, EF 76% with severe biventricular dysfunction. Milrinone weaned yesterday, co ox 57% today.   Weight down 19 pounds. Creatinine remains stable. CVP 3-4.   Feels good today, more energy. Walked 200 feet yesterday and felt slightly weak. Denies chest pain, can feel her heart beating fast at times.    Objective:   Weight Range: 198 lb 12.8 oz (90.2 kg) Body mass index is 32.09 kg/m.   Vital Signs:   Temp:  [97.6 F (36.4 C)-98.7 F (37.1 C)] 98.7 F (37.1 C) (06/28 0616) Pulse Rate:  [105-118] 105 (06/28 0616) Resp:  [16-23] 16 (06/28 0616) BP: (97-160)/(67-72) 105/72 (06/28 0616) SpO2:  [94 %-100 %] 94 % (06/28 0616) Weight:  [198 lb 12.8 oz (90.2 kg)] 198 lb 12.8 oz (90.2 kg) (06/28 0616) Last BM Date: 05/22/17  Weight change: Filed Weights   05/23/17 0655 05/24/17 0531 05/25/17 0616  Weight: 211 lb 12.8 oz (96.1 kg) 205 lb 11.2 oz (93.3 kg) 198 lb 12.8 oz (90.2 kg)    Intake/Output:   Intake/Output Summary (Last 24 hours) at 05/25/17 0826 Last data filed at 05/25/17 2831  Gross per 24 hour  Intake           876.02 ml  Output             8000 ml  Net         -7123.98 ml     Physical Exam: CVP 3-4 General:Fatigued appearing female, NAD.  HEENT: Normal Neck: Supple. 5-6 cm JVD.  Carotids 2+ bilat; no bruits. No thyromegaly or nodule noted. Cor: PMI nondisplaced. RRR. +S3. 2/6 SEM.  Lungs: Clear bilaterally. No wheeze, normal effort.  Abdomen: Soft, non-tender, non-distended, no HSM. No bruits or masses. +BS  Extremities: No cyanosis, clubbing, rash,trace pedal edema.  Neuro: Alert & orientedx3, cranial nerves grossly intact. moves all 4 extremities w/o difficulty. Affect pleasant     Telemetry:  Sinus tach rates up to 120 with ambulation   Labs: CBC  Recent Labs  05/22/17 2014  WBC 11.0*  NEUTROABS 6.9  HGB 12.3  HCT 38.6  MCV 85.8  PLT 517   Basic Metabolic Panel  Recent Labs  05/22/17 2116  05/24/17 0413 05/25/17 0438  NA  --   < > 138 139  K  --   < > 3.5 3.6  CL  --   < > 101 101  CO2  --   < > 30 29  GLUCOSE  --   < > 230* 197*  BUN  --   < > 14 14  CREATININE  --   < > 1.01* 0.94  CALCIUM  --   < > 8.3* 8.3*  MG 1.6*  --  1.9  --   < > = values in this interval not displayed. Liver Function Tests  Recent Labs  05/22/17 2014  AST 45*  ALT 63*  ALKPHOS 113  BILITOT 0.8  PROT 6.2*  ALBUMIN 3.5   No results for input(s): LIPASE, AMYLASE in the last 72 hours. Cardiac Enzymes  Recent Labs  05/22/17 2116 05/23/17 0230  TROPONINI 0.03* 0.03*    BNP: BNP (last 3 results)  Recent Labs  05/22/17 2014 05/22/17 2116  BNP 2,063.3* 2,030.5*    Thyroid Function Tests  Recent Labs  05/22/17 2116  TSH 2.192    Transthoracic Echocardiography 05/22/17 Study Conclusions  - Left ventricle: The cavity size was severely dilated. Systolic   function was severely reduced. The estimated ejection fraction   was in the range of 20% to 25%. - Aortic valve: There was mild regurgitation. - Mitral valve: There was severe regurgitation. - Left atrium: The atrium was moderately dilated. - Right ventricle: Systolic function was moderately reduced. - Tricuspid valve: There was moderate regurgitation. - Pulmonary arteries: PA peak pressure: 40 mm Hg (S). - Pericardium, extracardiac: A trivial pericardial effusion was   identified.       Medications:     Scheduled Medications: . aspirin EC  81 mg Oral Daily  . atorvastatin  10 mg Oral Daily  . digoxin  0.125 mg Oral Daily  . DULoxetine  90 mg Oral Daily  . fluticasone  2 spray Each Nare Daily  . furosemide  40 mg Intravenous BID  . glyBURIDE  5 mg Oral BID WC  . heparin  5,000 Units  Subcutaneous Q8H  . insulin aspart  0-5 Units Subcutaneous QHS  . insulin aspart  0-9 Units Subcutaneous TID WC  . insulin detemir  10 Units Subcutaneous BID  . ivabradine  2.5 mg Oral BID WC  . mouth rinse  15 mL Mouth Rinse BID  . potassium chloride  20 mEq Oral BID  . sacubitril-valsartan  1 tablet Oral BID  . sodium chloride flush  10-40 mL Intracatheter Q12H  . sodium chloride flush  3 mL Intravenous Q12H  . spironolactone  25 mg Oral Daily    Infusions: . sodium chloride 250 mL (05/22/17 2239)  . milrinone 0.125 mcg/kg/min (05/24/17 1407)    PRN Medications: sodium chloride, acetaminophen, ALPRAZolam, ondansetron (ZOFRAN) IV, sodium chloride flush, sodium chloride flush   Assessment/Plan   1. Acute on chronic systolic CHF: Echo EF 76% with biventricular dysfunction. 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 DM. But will need to rule out ischemic cause. She was treated for PNA in January.  - Continue milrinone 0.125 mcg. Repeat co ox now, may be able to turn off.  - CVP 3-4. Hold diuresis today.  - Continue Spiro 25mg  daily.   - Continue digoxin 0.125 mg - Continue Entresto 24/26 mg BID. Will not increase with soft BP.  - Needs R/L heart cath prior to dc.  2. Type 2 diabetes, poorly controlled - Continue SSI 3. Morbid obsity - Body mass index is 32.09 kg/m. 4. ADD - Stimulants on hold.  - Continue holding, she will need to hold these at discharge as well as they can cause worsening tachycardia which can worsen her cardiomyopathy.  5. Probable OSA - According to her husband snoring has worsened over the past year.  - Will set up outpatient sleep study.  6. Hypomagnesia -  Mg 1.9 yesterday.  7. Sinus tach - Continue ivabradine, may need to increase to 5 mg BID today.   Length of Stay: [place], NP  05/25/2017, 8:26 AM  Advanced Heart Failure Team Pager [phone] (M-F; 7a - 4p)  Please contact [facility] for night-coverage after hours (4p -7a ) and  weekends on [email or web address]  Patient seen and examined with Jettie Booze, NP. We discussed all aspects of the encounter. I agree with the assessment and plan as stated above.   Now fully diuresed. Milrinone cut back yesterday and co-ox now marginal. Would continue milrinone for now. Hold lasix. Continue HF regimen as  is without change today. Supp mag. Probable R/L cath tomorrow.   Glori Bickers, MD  8:59 AM

## 2017-05-25 NOTE — Discharge Summary (Addendum)
Advanced Heart Failure Discharge Note  Discharge Summary   Patient ID: Renee Howell MRN: [id number], DOB/AGE: [birth year] 51 y.o. Admit date: 05/22/2017 D/C date:     05/29/2017   Primary Discharge Diagnoses:  1. Acute systolic CHF with severe biventricular failure 2. NICM 3. DM2 4. Probable sleep apnea 5. Severe biventricular failure  6. Sinus tachycardia  Hospital Course: Renee Howell is a 51 year old female with a past medical history of HTN, obesity and poorly controlled DM. She was seen in Dr. Macky Lower office on 05/22/17 with SOB, found to have a reduced EF of 15% by echo. She was direct admitted to the hospital for volume overload and new diagnosis of systolic CHF.   She was hypotensive, markedly volume overloaded on exam and thus started on IV milrinone. Co ox was 66% initially on 0.25 mcg of milrinone, CVP was 16.  She was started on IV diuresis, had brisk diuresis with 40mg  IV lasix. CVP came down to 3, milrinone was reduced to 0.125 mcg. She had a right and left heart cath on 05/26/17, results below. This showed normal coronaries, Severe NICM EF 15%, and moderately reduced cardiac output despite milrinone support.   Pt continued to diurese and coox improved while still on milrinone 0.125 mcg/kg/min. HF meds adjusted as tolerated, including addition of Entresto and Corlanor.   Pt continued to improve and tolerated milrinone wean. Addition of beta blocker deferred with low output this admission.     cMRI performed 05/29/17, but results pending at time of this note.  Pt examined am of 05/29/17 and thought stable for discharge for home with close follow up. Daily lasix 20 mg added with Education on sliding scale diuretics.   HR improved into low 100s with corlanor titration.   Hospital course complicated by hyperglycemia.  Appreciate Diabetes coordinator input. Will resume oral agents on discharge. Encouraged prompt PCP follow up for further management.   Overall pt diuresed 22.6  and down at least 20 lbs. She will be discharged in stable condition to home with close follow up as below.   Right/Left Heart Cath and Coronary Angiography 05/26/17 Findings:  On milrinone 0.125 mcg/kg/min.  Ao = 102/76 (89) LV = 101/14 RA =  3 RV = 34/5 PA = 38/11 (27) PCW = 13 Fick cardiac output/index = 4.4/2.2 PVR = 3.2 WU SVR = 1565  Ao sat = 97% PA sat = 61%, 60%  Assessment:  1. Normal coronary arteries 2. Severe NICM EF 15% 3. Moderately reduced cardiac output despite milrinone support  Discharge Weight : 197 lbs Discharge Vitals: Blood pressure 103/82, pulse (!) 101, temperature 98 F (36.7 C), temperature source Oral, resp. rate 16, height 5\' 6"  (1.676 m), weight 197 lb 14.4 oz (89.8 kg), SpO2 98 %.  Labs: Lab Results  Component Value Date   WBC 11.0 (H) 05/22/2017   HGB 12.3 05/22/2017   HCT 38.6 05/22/2017   MCV 85.8 05/22/2017   PLT 327 05/22/2017    Recent Labs Lab 05/22/17 2014  05/29/17 0451  NA 137  < > 136  K 4.4  < > 4.5  CL 106  < > 101  CO2 24  < > 27  BUN 23*  < > 15  CREATININE 1.05*  < > 0.80  CALCIUM 9.2  < > 9.2  PROT 6.2*  --   --   BILITOT 0.8  --   --   ALKPHOS 113  --   --   ALT 63*  --   --  AST 45*  --   --   GLUCOSE 168*  < > 242*  < > = values in this interval not displayed. No results found for: CHOL, HDL, LDLCALC, TRIG BNP (last 3 results)  Recent Labs  05/22/17 2014 05/22/17 2116  BNP 2,063.3* 2,030.5*    ProBNP (last 3 results) No results for input(s): PROBNP in the last 8760 hours.   Diagnostic Studies/Procedures   No results found.  Discharge Medications   Allergies as of 05/29/2017      Reactions   Trulicity [dulaglutide] Other (See Comments)   Pt states that the trulicity makes her feel horrible three days after she uses it. States she will never take it again.    Latex Rash      Medication List    STOP taking these medications   amphetamine-dextroamphetamine 10 MG tablet Commonly known  as:  ADDERALL   amphetamine-dextroamphetamine 20 MG 24 hr capsule Commonly known as:  ADDERALL XR   ibuprofen 800 MG tablet Commonly known as:  ADVIL,MOTRIN     TAKE these medications   atorvastatin 10 MG tablet Commonly known as:  LIPITOR Take 10 mg by mouth daily.   budesonide-formoterol 80-4.5 MCG/ACT inhaler Commonly known as:  SYMBICORT Inhale 2 puffs into the lungs daily as needed (for shortness of breath).   busPIRone 7.5 MG tablet Commonly known as:  BUSPAR Take 7.5 mg by mouth 2 (two) times daily.   BYDUREON 2 MG Pen Generic drug:  Exenatide ER Inject 2 mg into the skin every Saturday.   cholecalciferol 1000 units tablet Commonly known as:  VITAMIN D Take 2,000 Units by mouth daily.   digoxin 0.125 MG tablet Commonly known as:  LANOXIN Take 1 tablet (0.125 mg total) by mouth daily. Start taking on:  05/30/2017   DULoxetine 30 MG capsule Commonly known as:  CYMBALTA Take 90 mg by mouth daily.   fluticasone 50 MCG/ACT nasal spray Commonly known as:  FLONASE Place 2 sprays into both nostrils daily.   furosemide 20 MG tablet Commonly known as:  LASIX Take 1 tablet (20 mg total) by mouth daily. Hold with dizziness. Take 1 extra tablet with increase weight/edema. Start taking on:  05/30/2017   glyBURIDE 5 MG tablet Commonly known as:  DIABETA Take 10 mg by mouth 2 (two) times daily.   insulin detemir 100 UNIT/ML injection Commonly known as:  LEVEMIR Inject 40 Units into the skin 2 (two) times daily.   insulin lispro 100 UNIT/ML injection Commonly known as:  HUMALOG Inject 2-8 Units into the skin 3 (three) times daily as needed for high blood sugar. Per sliding scale   ivabradine 5 MG Tabs tablet Commonly known as:  CORLANOR Take 1 tablet (5 mg total) by mouth 2 (two) times daily with a meal.   metFORMIN 1000 MG tablet Commonly known as:  GLUCOPHAGE Take 1,000 mg by mouth 2 (two) times daily.   montelukast 10 MG tablet Commonly known as:   SINGULAIR Take 10 mg by mouth daily.   PROAIR RESPICLICK 734 (90 Base) MCG/ACT Aepb Generic drug:  Albuterol Sulfate Inhale 2 puffs into the lungs every 6 (six) hours as needed (for shortness of breath).   ranitidine 150 MG tablet Commonly known as:  ZANTAC Take 150 mg by mouth daily.   [place] PRO-B Caps Take 1 capsule by mouth daily.   sacubitril-valsartan 24-26 MG Commonly known as:  ENTRESTO Take 1 tablet by mouth 2 (two) times daily.   SPIRIVA RESPIMAT 1.25 MCG/ACT Aers  Generic drug:  Tiotropium Bromide Monohydrate Inhale 2 puffs into the lungs daily as needed (for shortness of breath).   spironolactone 25 MG tablet Commonly known as:  ALDACTONE Take 1 tablet (25 mg total) by mouth daily. Start taking on:  05/30/2017       Disposition   The patient will be discharged in stable condition to home. Discharge Instructions    (HEART FAILURE PATIENTS) Call MD:  Anytime you have any of the following symptoms: 1) 3 pound weight gain in 24 hours or 5 pounds in 1 week 2) shortness of breath, with or without a dry hacking cough 3) swelling in the hands, feet or stomach 4) if you have to sleep on extra pillows at night in order to breathe.    Complete by:  As directed    Amb Referral to Cardiac Rehabilitation    Complete by:  As directed    Diagnosis:  Heart Failure (see criteria below if ordering Phase II)   Heart Failure Type:  Chronic Systolic & Diastolic   Diet - low sodium heart healthy    Complete by:  As directed    Heart Failure patients record your daily weight using the same scale at the same time of day    Complete by:  As directed    Increase activity slowly    Complete by:  As directed      Follow-up Information    [facility] Follow up on 06/02/2017.   Specialty:  Cardiology Why:  at 8:30 am for hospital follow up with Dr. Clayborne Dana NP. Garage code [address] Contact information: [address] [id number] [place] [phone]            Duration of Discharge Encounter: Greater than 35 minutes   Signed, Annamaria Helling 05/29/2017, 2:33 PM   Patient seen and examined with the above-signed Advanced Practice Provider and/or Housestaff. I personally reviewed laboratory data, imaging studies and relevant notes. I independently examined the patient and formulated the important aspects of the plan. I have edited the note to reflect any of my changes or salient points. I have personally discussed the plan with the patient and/or family.  She is improved. Stable for d/c today. Will follow closely in HF Clinic.   Glori Bickers, MD  9:14 PM

## 2017-05-26 ENCOUNTER — Encounter (HOSPITAL_COMMUNITY)
Admission: AD | Disposition: A | Discharge status: Home / Self Care | Payer: Self-pay | Source: Ambulatory Visit | Attending: Cardiology

## 2017-05-26 ENCOUNTER — Encounter (HOSPITAL_COMMUNITY): Payer: Self-pay | Admitting: Internal Medicine

## 2017-05-26 DIAGNOSIS — G4733 Obstructive sleep apnea (adult) (pediatric): Secondary | ICD-10-CM

## 2017-05-26 HISTORY — PX: RIGHT/LEFT HEART CATH AND CORONARY ANGIOGRAPHY: CATH118266

## 2017-05-26 LAB — GLUCOSE, CAPILLARY
GLUCOSE-CAPILLARY: 165 mg/dL — AB (ref 65–99)
GLUCOSE-CAPILLARY: 223 mg/dL — AB (ref 65–99)
GLUCOSE-CAPILLARY: 285 mg/dL — AB (ref 65–99)
Glucose-Capillary: 228 mg/dL — ABNORMAL HIGH (ref 65–99)

## 2017-05-26 LAB — BASIC METABOLIC PANEL
Anion gap: 7 (ref 5–15)
BUN: 14 mg/dL (ref 6–20)
CO2: 28 mmol/L (ref 22–32)
CREATININE: 0.85 mg/dL (ref 0.44–1.00)
Calcium: 8.5 mg/dL — ABNORMAL LOW (ref 8.9–10.3)
Chloride: 103 mmol/L (ref 101–111)
GFR calc Af Amer: >60 mL/min (ref >60)
Glucose, Bld: 219 mg/dL — ABNORMAL HIGH (ref 65–99)
Potassium: 4.5 mmol/L (ref 3.5–5.1)
SODIUM: 138 mmol/L (ref 135–145)

## 2017-05-26 LAB — COOXEMETRY PANEL
Carboxyhemoglobin: 1.3 % (ref 0.5–1.5)
Methemoglobin: 0.7 % (ref 0.0–1.5)
O2 Saturation: 60.1 %
TOTAL HEMOGLOBIN: 13.8 g/dL (ref 12.0–16.0)

## 2017-05-26 LAB — POCT I-STAT 3, ART BLOOD GAS (G3+)
Acid-base deficit: 2 mmol/L (ref 0.0–2.0)
BICARBONATE: 22.6 mmol/L (ref 20.0–28.0)
O2 Saturation: 97 %
PCO2 ART: 35.6 mmHg (ref 32.0–48.0)
PH ART: 7.411 (ref 7.350–7.450)
PO2 ART: 89 mmHg (ref 83.0–108.0)
TCO2: 24 mmol/L (ref 0–100)

## 2017-05-26 LAB — POCT I-STAT 3, VENOUS BLOOD GAS (G3P V)
Acid-base deficit: 1 mmol/L (ref 0.0–2.0)
BICARBONATE: 24.4 mmol/L (ref 20.0–28.0)
BICARBONATE: 25.9 mmol/L (ref 20.0–28.0)
O2 SAT: 59 %
O2 Saturation: 62 %
PCO2 VEN: 43.2 mmHg — AB (ref 44.0–60.0)
PCO2 VEN: 46.9 mmHg (ref 44.0–60.0)
PH VEN: 7.36 (ref 7.250–7.430)
TCO2: 26 mmol/L (ref 0–100)
TCO2: 27 mmol/L (ref 0–100)
pH, Ven: 7.35 (ref 7.250–7.430)
pO2, Ven: 33 mmHg (ref 32.0–45.0)
pO2, Ven: 34 mmHg (ref 32.0–45.0)

## 2017-05-26 SURGERY — RIGHT/LEFT HEART CATH AND CORONARY ANGIOGRAPHY
Anesthesia: LOCAL

## 2017-05-26 MED ORDER — VERAPAMIL HCL 2.5 MG/ML IV SOLN
INTRAVENOUS | Status: AC
  Filled 2017-05-26: qty 2

## 2017-05-26 MED ORDER — IOPAMIDOL (ISOVUE-370) INJECTION 76%
INTRAVENOUS | Status: DC | PRN
  Administered 2017-05-26: 30 mL via INTRAVENOUS

## 2017-05-26 MED ORDER — VERAPAMIL HCL 2.5 MG/ML IV SOLN
INTRAVENOUS | Status: DC | PRN
  Administered 2017-05-26: 10 mL via INTRA_ARTERIAL

## 2017-05-26 MED ORDER — HEPARIN (PORCINE) IN NACL 2-0.9 UNIT/ML-% IJ SOLN
INTRAMUSCULAR | Status: AC | PRN
  Administered 2017-05-26: 1000 mL

## 2017-05-26 MED ORDER — IOPAMIDOL (ISOVUE-370) INJECTION 76%
INTRAVENOUS | Status: AC
  Filled 2017-05-26: qty 100

## 2017-05-26 MED ORDER — FENTANYL CITRATE (PF) 100 MCG/2ML IJ SOLN
INTRAMUSCULAR | Status: DC | PRN
  Administered 2017-05-26: 25 ug via INTRAVENOUS

## 2017-05-26 MED ORDER — ONDANSETRON HCL 4 MG/2ML IJ SOLN: 4.0000 mg | Freq: Four times a day (QID) | INTRAMUSCULAR | Status: DC | PRN

## 2017-05-26 MED ORDER — SODIUM CHLORIDE 0.9% FLUSH
3.0000 mL | Freq: Two times a day (BID) | INTRAVENOUS | Status: DC
  Administered 2017-05-26 – 2017-05-28 (×3): 3 mL via INTRAVENOUS

## 2017-05-26 MED ORDER — HEPARIN SODIUM (PORCINE) 1000 UNIT/ML IJ SOLN
INTRAMUSCULAR | Status: DC | PRN
  Administered 2017-05-26: 4500 [IU] via INTRAVENOUS

## 2017-05-26 MED ORDER — HEPARIN SODIUM (PORCINE) 1000 UNIT/ML IJ SOLN
INTRAMUSCULAR | Status: AC
  Filled 2017-05-26: qty 1

## 2017-05-26 MED ORDER — ACETAMINOPHEN 325 MG PO TABS
650.0000 mg | ORAL_TABLET | ORAL | Status: DC | PRN
  Administered 2017-05-26 – 2017-05-27 (×2): 650 mg via ORAL
  Filled 2017-05-26 (×2): qty 2

## 2017-05-26 MED ORDER — SODIUM CHLORIDE 0.9 % IV SOLN: INTRAVENOUS | Status: AC

## 2017-05-26 MED ORDER — MIDAZOLAM HCL 2 MG/2ML IJ SOLN
INTRAMUSCULAR | Status: DC | PRN
  Administered 2017-05-26: 1 mg via INTRAVENOUS

## 2017-05-26 MED ORDER — LIDOCAINE HCL (PF) 1 % IJ SOLN
INTRAMUSCULAR | Status: DC | PRN
  Administered 2017-05-26: 30 mL via INTRADERMAL

## 2017-05-26 MED ORDER — FENTANYL CITRATE (PF) 100 MCG/2ML IJ SOLN
INTRAMUSCULAR | Status: AC
  Filled 2017-05-26: qty 2

## 2017-05-26 MED ORDER — MIDAZOLAM HCL 2 MG/2ML IJ SOLN
INTRAMUSCULAR | Status: AC
  Filled 2017-05-26: qty 2

## 2017-05-26 MED ORDER — HEPARIN (PORCINE) IN NACL 2-0.9 UNIT/ML-% IJ SOLN
INTRAMUSCULAR | Status: AC
  Filled 2017-05-26: qty 1000

## 2017-05-26 MED ORDER — SODIUM CHLORIDE 0.9% FLUSH: 3.0000 mL | INTRAVENOUS | Status: DC | PRN

## 2017-05-26 MED ORDER — SODIUM CHLORIDE 0.9 % IV SOLN: 250.0000 mL | INTRAVENOUS | Status: DC | PRN

## 2017-05-26 SURGICAL SUPPLY — 14 items
CATH INFINITI 5 FR JL3.5 (CATHETERS) ×1 IMPLANT
CATH INFINITI JR4 5F (CATHETERS) ×1 IMPLANT
CATH SWAN GANZ 7F STRAIGHT (CATHETERS) ×1 IMPLANT
DEVICE RAD COMP TR BAND LRG (VASCULAR PRODUCTS) ×1 IMPLANT
GLIDESHEATH SLEND SS 6F .021 (SHEATH) ×1 IMPLANT
GUIDEWIRE INQWIRE 1.5J.035X260 (WIRE) IMPLANT
INQWIRE 1.5J .035X260CM (WIRE) ×2
KIT HEART LEFT (KITS) ×2 IMPLANT
KIT HEART RIGHT NAMIC (KITS) ×1 IMPLANT
PACK CARDIAC CATHETERIZATION (CUSTOM PROCEDURE TRAY) ×2 IMPLANT
SHEATH PINNACLE 7F 10CM (SHEATH) ×1 IMPLANT
TRANSDUCER W/STOPCOCK (MISCELLANEOUS) ×2 IMPLANT
TUBING CIL FLEX 10 FLL-RA (TUBING) ×2 IMPLANT
WIRE HI TORQ VERSACORE-J 145CM (WIRE) ×1 IMPLANT

## 2017-05-26 NOTE — Interval H&P Note (Signed)
History and Physical Interval Note:  05/26/2017 12:38 PM  Renee Howell  has presented today for surgery, with the diagnosis of hf  The various methods of treatment have been discussed with the patient and family. After consideration of risks, benefits and other options for treatment, the patient has consented to  Procedure(s): Right/Left Heart Cath and Coronary Angiography (N/A) and possible coronary angioplasty as a surgical intervention .  The patient's history has been reviewed, patient examined, no change in status, stable for surgery.  I have reviewed the patient's chart and labs.  Questions were answered to the patient's satisfaction.     Bensimhon, Quillian Quince

## 2017-05-26 NOTE — Progress Notes (Addendum)
Site area: RFV Site Prior to Removal:  Level 0 Pressure Applied For:15 min Manual:   yes Patient Status During Pull: stable  Post Pull Site:  Level 0 Post Pull Instructions Given:yes   Post Pull Pulses Present:palpable  Dressing Applied:  tegaderm Bedrest begins @ 1400 till 1800 Comments:

## 2017-05-26 NOTE — Progress Notes (Signed)
Heart Failure Navigator Consult Note  Presentation: Per Dr. [name]- Renee Howell is a 51 y.o.female with a h/o obesity, HTN. ADD and poorly-controlled DM2 (HgB a1c ~10 but getting better) who is being admitted from Dr. Curt Howell office for a new diagnosis of acute systolic HF with EF 15% by echo today.   She was referred by Dr. Marin Howell.   She's been having significant issues with her breathing over the past 6 months. Initially sought attention in January due to cough an SOB. Told she had a "spot" in right lung and treated for PNA. Denies fevers, chills or greenish sputum at that time. Then developed hematuria and treated for UTI. More recently had upper extremity cellulitis and treated again with abx,   Over past few weeks has had progressive SOB to the point where she is unable to perform ADLs, also severe orthopnea and PND with 20 pound weight gain. Apparently has hasd 2 recent chest CTs (one at [facility] and one at a [facility] - not in Care everywhere). Told she had spot in her right lung but doesn't know details.   Went to see Dr. Curt Howell today for evaluation of tachycardia and found to be in acute HF with LVEF 15%, moderate to severe RV dysfunction and moderate to severe MR. Referred for admission.  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 Has sleep study many years ago with mild OSA but husband says it is worse now. Drinks rarely. No drug use. She has not had much in the way of chest pain, though a few months ago, did have 2-5 minutes of chest pain associated with pressure in the center of her chest that went away on its own.   Past Medical History:  Diagnosis Date  . Allergy   . Diabetes mellitus   . Hypertension   . Kidney infection     Social History   Social History  . Marital status: Married    Spouse name: N/A  . Number of children: N/A  . Years of education: N/A   Social History Main Topics  . Smoking status: Never Smoker  . Smokeless  tobacco: Never Used  . Alcohol use Yes     Howell: occ  . Drug use: No  . Sexual activity: Not Asked   Other Topics Concern  . None   Social History Narrative  . None    ECHO:Study Conclusions-05/22/17  - Left ventricle: The cavity size was severely dilated. Systolic   function was severely reduced. The estimated ejection fraction   was in the range of 20% to 25%. - Aortic valve: There was mild regurgitation. - Mitral valve: There was severe regurgitation. - Left atrium: The atrium was moderately dilated. - Right ventricle: Systolic function was moderately reduced. - Tricuspid valve: There was moderate regurgitation. - Pulmonary arteries: PA peak pressure: 40 mm Hg (S). - Pericardium, extracardiac: A trivial pericardial effusion was   identified.  BNP    Component Value Date/Time   BNP 2,030.5 (H) 05/22/2017 2116    ProBNP No results found for: PROBNP   Education Assessment and Provision:  Detailed education and instructions provided on heart failure disease management including the following:  Signs and symptoms of Heart Failure When to call the physician Importance of daily weights Low sodium diet Fluid restriction Medication management Anticipated future follow-up appointments  Patient education given on each of the above topics.  Patient acknowledges understanding and acceptance of all instructions.  [name] and  her husband regarding her recent diagnosis and current hospitalization for HF.  She admits that it is "a lot to take in".  She does have a scale at home.  We discussed the importance of daily weights and when to contact the physician.  I also reviewed a low sodium diet and high sodium foods to avoid.  She is concerned about managing Diabetes and HF as well as medications for both.  She denies any issues getting or taking prescribed medications.  She will follow in the AHF Clinic after discharge.  Education Materials:  "Living Better  With Heart Failure" Booklet, Daily Weight Tracker Tool  High Risk Criteria for Readmission and/or Poor Patient Outcomes   EF <30%- 20-25%  2 or more admissions in 6 months- 2/6 mo --New HF  Difficult social situation- No   Demonstrates medication noncompliance- No denies   Barriers of Care:  New HF, Knowledge and compliance  Discharge Planning:   Plans to return home in [place] with her husband

## 2017-05-26 NOTE — Progress Notes (Signed)
Results for MASHELLE, BUSICK (MRN [id number]) as of 05/26/2017 09:04  Ref. Range 05/25/2017 08:06 05/25/2017 11:34 05/25/2017 16:10 05/25/2017 19:54 05/26/2017 08:10  Glucose-Capillary Latest Ref Range: 65 - 99 mg/dL 191 (H) 258 (H) 300 (H) 240 (H) 228 (H)  Received diabetes coordinator consult.  Noted that patient takes Levemir 40 units BID and Humalog 2-8 units sliding scale TID at home. Does take Glyburide and Metformin at home.  Recommend increasing Lantus to 20 units BID (1/2 of home dose), continue Novolog SENSITIVE correction scale TID & HS, and add Novolog 3-4 units TID with meals if eating at least 50% of meal. Titrate dose of Lantus as needed.  Harvel Ricks RN BSN CDE Diabetes Coordinator Pager: [phone]  8am-5pm

## 2017-05-26 NOTE — Progress Notes (Signed)
Advanced Heart Failure Rounding Note  PCP:Le, Thao P, DO Cardiologist:new (Tinlee Navarrette) Primary Electrophysiologist:Will Meredith Leeds, MD   Subjective:    Admitted 05/22/17 from Dr. Macky Lower office with acute on chronic systolic CHF, EF 15% with severe biventricular dysfunction. Co ox 60% on Milrinone 0.125 mcg.   Weight down 19 pounds total.   Feels good today, denies SOB and chest pain. CVP 3-4.   Objective:   Weight Range: 198 lb 12.8 oz (90.2 kg) Body mass index is 32.09 kg/m.   Vital Signs:   Temp:  [98 F (36.7 C)-98.8 F (37.1 C)] 98 F (36.7 C) (06/29 0708) Pulse Rate:  [78-113] 109 (06/29 0708) Resp:  [17-20] 20 (06/29 0708) BP: (92-109)/(52-69) 105/69 (06/29 0708) SpO2:  [92 %-100 %] 97 % (06/29 0708) Weight:  [198 lb 12.8 oz (90.2 kg)] 198 lb 12.8 oz (90.2 kg) (06/29 0708) Last BM Date: 05/22/17  Weight change: Filed Weights   05/24/17 0531 05/25/17 0616 05/26/17 0708  Weight: 205 lb 11.2 oz (93.3 kg) 198 lb 12.8 oz (90.2 kg) 198 lb 12.8 oz (90.2 kg)    Intake/Output:   Intake/Output Summary (Last 24 hours) at 05/26/17 0941 Last data filed at 05/26/17 0709  Gross per 24 hour  Intake             1244 ml  Output             2750 ml  Net            -1506 ml     Physical Exam: CVP: 3-4  General:Fatigued appearing female.  No resp difficulty. HEENT: Normal Neck: Supple. JVP flat. Carotids 2+ bilat; no bruits. No thyromegaly or nodule noted. Cor: PMI nondisplaced. Tachy, regular rhythm, No M/G/R noted Lungs: CTAB, normal effort. Abdomen: Soft, non-tender, non-distended, no HSM. No bruits or masses. +BS  Extremities: No cyanosis, clubbing, rash, R and LLE no edema. R upper arm PICC.  Neuro: Alert & orientedx3, cranial nerves grossly intact. moves all 4 extremities w/o difficulty. Affect pleasant   Telemetry: Sinus tach 110-120's.    Basic Metabolic Panel  Recent Labs  05/24/17 0413 05/25/17 0438 05/26/17 0410    NA 138 139 138  K 3.5 3.6 4.5  CL 101 101 103  CO2 30 29 28   GLUCOSE 230* 197* 219*  BUN 14 14 14   CREATININE 1.01* 0.94 0.85  CALCIUM 8.3* 8.3* 8.5*  MG 1.9  --   --     BNP: BNP (last 3 results)  Recent Labs  05/22/17 2014 05/22/17 2116  BNP 2,063.3* 2,030.5*      Transthoracic Echocardiography 05/22/17 Study Conclusions  - Left ventricle: The cavity size was severely dilated. Systolic   function was severely reduced. The estimated ejection fraction   was in the range of 20% to 25%. - Aortic valve: There was mild regurgitation. - Mitral valve: There was severe regurgitation. - Left atrium: The atrium was moderately dilated. - Right ventricle: Systolic function was moderately reduced. - Tricuspid valve: There was moderate regurgitation. - Pulmonary arteries: PA peak pressure: 40 mm Hg (S). - Pericardium, extracardiac: A trivial pericardial effusion was   identified.       Medications:     Scheduled Medications: . aspirin EC  81 mg Oral Daily  . atorvastatin  10 mg Oral Daily  . digoxin  0.125 mg Oral Daily  . DULoxetine  90 mg Oral Daily  . fluticasone  2 spray Each Nare Daily  . glyBURIDE  5  mg Oral BID WC  . heparin  5,000 Units Subcutaneous Q8H  . insulin aspart  0-5 Units Subcutaneous QHS  . insulin aspart  0-9 Units Subcutaneous TID WC  . insulin detemir  10 Units Subcutaneous BID  . ivabradine  2.5 mg Oral BID WC  . mouth rinse  15 mL Mouth Rinse BID  . potassium chloride  20 mEq Oral BID  . sacubitril-valsartan  1 tablet Oral BID  . sodium chloride flush  10-40 mL Intracatheter Q12H  . sodium chloride flush  3 mL Intravenous Q12H  . spironolactone  25 mg Oral Daily    Infusions: . sodium chloride 250 mL (05/22/17 2239)  . sodium chloride 10 mL/hr at 05/26/17 0637  . milrinone 0.125 mcg/kg/min (05/25/17 1548)    PRN Medications: sodium chloride, acetaminophen, ALPRAZolam, magnesium hydroxide, ondansetron (ZOFRAN) IV, sodium chloride  flush, sodium chloride flush   Assessment/Plan   1. Acute on chronic systolic CHF: Echo EF 32% with biventricular dysfunction. 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 DM. But will need to rule out ischemic cause. She was treated for PNA in January.  - Continue milrinone 0.125 mcg  - CVP 4, continue to hold diuresis today, will restart based on RHC results.  - Continue Spiro 25mg  daily.   - Continue digoxin 0.125 mg - Continue Entresto 24/26 mg BID.  - For right/left heart cath today.  2. Type 2 diabetes, poorly controlled - Continue SSI 3. Morbid obsity - Body mass index is 32.09 kg/m.  - HF navigator seeing today, given education about diet  4. ADD - Stimulants on hold.  - Continue holding, she will need to hold these at discharge as well as they can cause worsening tachycardia which can worsen her cardiomyopathy.  - No change.  5. Probable OSA - According to her husband snoring has worsened over the past year.  - No change, will have sleep study outpatient.  7. Sinus tach - Continue ivabradine at 2.5mg  BID today.   Length of Stay: [place], NP  05/26/2017, 9:41 AM  Advanced Heart Failure Team Pager [phone] (M-F; 7a - 4p)  Please contact [facility] for night-coverage after hours (4p -7a ) and weekends on [email or web address]   Patient seen and examined with Jettie Booze, NP. We discussed all aspects of the encounter. I agree with the assessment and plan as stated above.   She is stable on milrinone 0.125. CVP low. Will hold diuretics. Plan R/L cath today. Will also likely need cMRI - either inpatient or outpatient.  Glori Bickers, MD  12:37 PM

## 2017-05-26 NOTE — H&P (View-Only) (Signed)
Advanced Heart Failure Rounding Note  PCP:Le, Thao P, DO Cardiologist:new (Rayel Santizo) Primary Electrophysiologist:Will Meredith Leeds, MD   Subjective:    Admitted 05/22/17 from Dr. Macky Lower office with acute on chronic systolic CHF, EF 84% with severe biventricular dysfunction. Co ox 60% on Milrinone 0.125 mcg.   Weight down 19 pounds total.   Feels good today, denies SOB and chest pain. CVP 3-4.   Objective:   Weight Range: 198 lb 12.8 oz (90.2 kg) Body mass index is 32.09 kg/m.   Vital Signs:   Temp:  [98 F (36.7 C)-98.8 F (37.1 C)] 98 F (36.7 C) (06/29 0708) Pulse Rate:  [78-113] 109 (06/29 0708) Resp:  [17-20] 20 (06/29 0708) BP: (92-109)/(52-69) 105/69 (06/29 0708) SpO2:  [92 %-100 %] 97 % (06/29 0708) Weight:  [198 lb 12.8 oz (90.2 kg)] 198 lb 12.8 oz (90.2 kg) (06/29 0708) Last BM Date: 05/22/17  Weight change: Filed Weights   05/24/17 0531 05/25/17 0616 05/26/17 0708  Weight: 205 lb 11.2 oz (93.3 kg) 198 lb 12.8 oz (90.2 kg) 198 lb 12.8 oz (90.2 kg)    Intake/Output:   Intake/Output Summary (Last 24 hours) at 05/26/17 0941 Last data filed at 05/26/17 0709  Gross per 24 hour  Intake             1244 ml  Output             2750 ml  Net            -1506 ml     Physical Exam: CVP: 3-4  General:Fatigued appearing female.  No resp difficulty. HEENT: Normal Neck: Supple. JVP flat. Carotids 2+ bilat; no bruits. No thyromegaly or nodule noted. Cor: PMI nondisplaced. Tachy, regular rhythm, No M/G/R noted Lungs: CTAB, normal effort. Abdomen: Soft, non-tender, non-distended, no HSM. No bruits or masses. +BS  Extremities: No cyanosis, clubbing, rash, R and LLE no edema. R upper arm PICC.  Neuro: Alert & orientedx3, cranial nerves grossly intact. moves all 4 extremities w/o difficulty. Affect pleasant   Telemetry: Sinus tach 110-120's.    Basic Metabolic Panel  Recent Labs  05/24/17 0413 05/25/17 0438 05/26/17 0410    NA 138 139 138  K 3.5 3.6 4.5  CL 101 101 103  CO2 30 29 28   GLUCOSE 230* 197* 219*  BUN 14 14 14   CREATININE 1.01* 0.94 0.85  CALCIUM 8.3* 8.3* 8.5*  MG 1.9  --   --     BNP: BNP (last 3 results)  Recent Labs  05/22/17 2014 05/22/17 2116  BNP 2,063.3* 2,030.5*      Transthoracic Echocardiography 05/22/17 Study Conclusions  - Left ventricle: The cavity size was severely dilated. Systolic   function was severely reduced. The estimated ejection fraction   was in the range of 20% to 25%. - Aortic valve: There was mild regurgitation. - Mitral valve: There was severe regurgitation. - Left atrium: The atrium was moderately dilated. - Right ventricle: Systolic function was moderately reduced. - Tricuspid valve: There was moderate regurgitation. - Pulmonary arteries: PA peak pressure: 40 mm Hg (S). - Pericardium, extracardiac: A trivial pericardial effusion was   identified.       Medications:     Scheduled Medications: . aspirin EC  81 mg Oral Daily  . atorvastatin  10 mg Oral Daily  . digoxin  0.125 mg Oral Daily  . DULoxetine  90 mg Oral Daily  . fluticasone  2 spray Each Nare Daily  . glyBURIDE  5  mg Oral BID WC  . heparin  5,000 Units Subcutaneous Q8H  . insulin aspart  0-5 Units Subcutaneous QHS  . insulin aspart  0-9 Units Subcutaneous TID WC  . insulin detemir  10 Units Subcutaneous BID  . ivabradine  2.5 mg Oral BID WC  . mouth rinse  15 mL Mouth Rinse BID  . potassium chloride  20 mEq Oral BID  . sacubitril-valsartan  1 tablet Oral BID  . sodium chloride flush  10-40 mL Intracatheter Q12H  . sodium chloride flush  3 mL Intravenous Q12H  . spironolactone  25 mg Oral Daily    Infusions: . sodium chloride 250 mL (05/22/17 2239)  . sodium chloride 10 mL/hr at 05/26/17 0637  . milrinone 0.125 mcg/kg/min (05/25/17 1548)    PRN Medications: sodium chloride, acetaminophen, ALPRAZolam, magnesium hydroxide, ondansetron (ZOFRAN) IV, sodium chloride  flush, sodium chloride flush   Assessment/Plan   1. Acute on chronic systolic CHF: Echo EF 71% with biventricular dysfunction. 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 DM. But will need to rule out ischemic cause. She was treated for PNA in January.  - Continue milrinone 0.125 mcg  - CVP 4, continue to hold diuresis today, will restart based on RHC results.  - Continue Spiro 25mg  daily.   - Continue digoxin 0.125 mg - Continue Entresto 24/26 mg BID.  - For right/left heart cath today.  2. Type 2 diabetes, poorly controlled - Continue SSI 3. Morbid obsity - Body mass index is 32.09 kg/m.  - HF navigator seeing today, given education about diet  4. ADD - Stimulants on hold.  - Continue holding, she will need to hold these at discharge as well as they can cause worsening tachycardia which can worsen her cardiomyopathy.  - No change.  5. Probable OSA - According to her husband snoring has worsened over the past year.  - No change, will have sleep study outpatient.  7. Sinus tach - Continue ivabradine at 2.5mg  BID today.   Length of Stay: [place], NP  05/26/2017, 9:41 AM  Advanced Heart Failure Team Pager [phone] (M-F; 7a - 4p)  Please contact [facility] for night-coverage after hours (4p -7a ) and weekends on [email or web address]   Patient seen and examined with Jettie Booze, NP. We discussed all aspects of the encounter. I agree with the assessment and plan as stated above.   She is stable on milrinone 0.125. CVP low. Will hold diuretics. Plan R/L cath today. Will also likely need cMRI - either inpatient or outpatient.  Glori Bickers, MD  12:37 PM

## 2017-05-27 LAB — BASIC METABOLIC PANEL
ANION GAP: 9 (ref 5–15)
BUN: 12 mg/dL (ref 6–20)
CHLORIDE: 103 mmol/L (ref 101–111)
CO2: 25 mmol/L (ref 22–32)
CREATININE: 0.86 mg/dL (ref 0.44–1.00)
Calcium: 8.8 mg/dL — ABNORMAL LOW (ref 8.9–10.3)
GFR calc non Af Amer: >60 mL/min (ref >60)
Glucose, Bld: 191 mg/dL — ABNORMAL HIGH (ref 65–99)
POTASSIUM: 4.4 mmol/L (ref 3.5–5.1)
SODIUM: 137 mmol/L (ref 135–145)

## 2017-05-27 LAB — COOXEMETRY PANEL
Carboxyhemoglobin: 0.9 % (ref 0.5–1.5)
METHEMOGLOBIN: 1 % (ref 0.0–1.5)
O2 Saturation: 67.4 %
TOTAL HEMOGLOBIN: 13.8 g/dL (ref 12.0–16.0)

## 2017-05-27 LAB — MAGNESIUM: MAGNESIUM: 2 mg/dL (ref 1.7–2.4)

## 2017-05-27 LAB — GLUCOSE, CAPILLARY
GLUCOSE-CAPILLARY: 198 mg/dL — AB (ref 65–99)
Glucose-Capillary: 261 mg/dL — ABNORMAL HIGH (ref 65–99)
Glucose-Capillary: 336 mg/dL — ABNORMAL HIGH (ref 65–99)
Glucose-Capillary: 239 mg/dL — ABNORMAL HIGH (ref 65–99)

## 2017-05-27 MED ORDER — INSULIN DETEMIR 100 UNIT/ML ~~LOC~~ SOLN
20.0000 [IU] | Freq: Two times a day (BID) | SUBCUTANEOUS | Status: DC
  Administered 2017-05-27 – 2017-05-29 (×4): 20 [IU] via SUBCUTANEOUS
  Filled 2017-05-27 (×5): qty 0.2

## 2017-05-27 NOTE — Progress Notes (Signed)
Advanced Heart Failure Rounding Note  PCP:Le, Thao P, DO Cardiologist:new (Denver Bentson) Primary Electrophysiologist:Will Meredith Leeds, MD   Subjective:    Admitted 05/22/17 from Dr. Macky Lower office with acute on chronic systolic CHF, EF 27% with severe biventricular dysfunction. Co ox 60% on Milrinone 0.125 mcg.   Weight down 19 pounds total.   Cath 6/29 with no CAD. RHC ok.   Remains on milrinone 0.125. Feels ok. Walked with CR. Co-ox 67%. HR 110-120 range. CVP 3-4    Objective:   Weight Range: 89.9 kg (198 lb 3.2 oz) Body mass index is 31.99 kg/m.   Vital Signs:   Temp:  [97.9 F (36.6 C)-98.7 F (37.1 C)] 97.9 F (36.6 C) (06/30 0700) Pulse Rate:  [104-118] 109 (06/30 0855) Resp:  [0-30] 22 (06/30 0700) BP: (98-124)/(57-85) 98/57 (06/30 0700) SpO2:  [92 %-100 %] 100 % (06/30 0700) Weight:  [89.9 kg (198 lb 3.2 oz)] 89.9 kg (198 lb 3.2 oz) (06/30 0500) Last BM Date: 05/22/17  Weight change: Filed Weights   05/25/17 0616 05/26/17 0708 05/27/17 0500  Weight: 90.2 kg (198 lb 12.8 oz) 90.2 kg (198 lb 12.8 oz) 89.9 kg (198 lb 3.2 oz)    Intake/Output:   Intake/Output Summary (Last 24 hours) at 05/27/17 1218 Last data filed at 05/27/17 0900  Gross per 24 hour  Intake             1703 ml  Output             2700 ml  Net             -997 ml     Physical Exam: CVP: 3-4  General:Sitting in bed No resp difficulty. HEENT: Normal anicteric Neck: Supple. JVP flat. Carotids 2+ bilat; no bruits. No thyromegaly or nodule noted. Cor: PMI nondisplaced. Tachy regular +s3 Lungs: Clear  Abdomen: soft NT/ND good BS Extremities: no cyanosis, clubbing, rash, edema +RUE picc Neuro: alert & oriented x 3, cranial nerves grossly intact. moves all 4 extremities w/o difficulty. Affect pleasant    Telemetry: Sinus tach 110-120.  Personally reviewed   Basic Metabolic Panel  Recent Labs  05/26/17 0410 05/27/17 0500  NA 138 137  K 4.5 4.4  CL  103 103  CO2 28 25  GLUCOSE 219* 191*  BUN 14 12  CREATININE 0.85 0.86  CALCIUM 8.5* 8.8*  MG  --  2.0    BNP: BNP (last 3 results)  Recent Labs  05/22/17 2014 05/22/17 2116  BNP 2,063.3* 2,030.5*      Transthoracic Echocardiography 05/22/17 Study Conclusions  - Left ventricle: The cavity size was severely dilated. Systolic   function was severely reduced. The estimated ejection fraction   was in the range of 20% to 25%. - Aortic valve: There was mild regurgitation. - Mitral valve: There was severe regurgitation. - Left atrium: The atrium was moderately dilated. - Right ventricle: Systolic function was moderately reduced. - Tricuspid valve: There was moderate regurgitation. - Pulmonary arteries: PA peak pressure: 40 mm Hg (S). - Pericardium, extracardiac: A trivial pericardial effusion was   identified.       Medications:     Scheduled Medications: . aspirin EC  81 mg Oral Daily  . atorvastatin  10 mg Oral Daily  . digoxin  0.125 mg Oral Daily  . DULoxetine  90 mg Oral Daily  . fluticasone  2 spray Each Nare Daily  . glyBURIDE  5 mg Oral BID WC  . heparin  5,000 Units  Subcutaneous Q8H  . insulin aspart  0-5 Units Subcutaneous QHS  . insulin aspart  0-9 Units Subcutaneous TID WC  . insulin detemir  10 Units Subcutaneous BID  . ivabradine  2.5 mg Oral BID WC  . mouth rinse  15 mL Mouth Rinse BID  . sacubitril-valsartan  1 tablet Oral BID  . sodium chloride flush  10-40 mL Intracatheter Q12H  . sodium chloride flush  3 mL Intravenous Q12H  . sodium chloride flush  3 mL Intravenous Q12H  . spironolactone  25 mg Oral Daily    Infusions: . sodium chloride 250 mL (05/22/17 2239)  . sodium chloride    . milrinone 0.125 mcg/kg/min (05/26/17 1852)    PRN Medications: sodium chloride, sodium chloride, acetaminophen, ALPRAZolam, magnesium hydroxide, ondansetron (ZOFRAN) IV, sodium chloride flush, sodium chloride flush, sodium chloride  flush   Assessment/Plan   1. Acute on chronic systolic CHF: Echo EF 57% with biventricular dysfunction. 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 DM. But will need to rule out ischemic cause. She was treated for PNA in January.  - Cath 6/29 No CAD - Volume status and CVP ok. Will stop milrinone. - CVP low. Continue to hold lasix.  - Continue Spiro 25mg  daily.   - Continue digoxin 0.125 mg - Continue Entresto 24/26 mg BID.  - If co-ox stable off milrinone can start carvedilol 3.125 bid tomorrow - cMRI on Monday 2. Type 2 diabetes, poorly controlled - Sugars are up. Will increase lantus per DM2 coordinator recs. Continue SSI 3. Morbid obsity - Body mass index is 31.99 kg/m.  - HF navigator has seen and educated 4. ADD - Stimulants on hold as ? Tachycardia possible etiology for HF 5. Probable OSA - According to her husband snoring has worsened over the past year.  - No change, will have sleep study outpatient.  6. Sinus tach - Continue ivabradine at 2.5mg  BID today. Add carvedilol as tolerated.  Length of Stay: 5   Glori Bickers, MD  05/27/2017, 12:18 PM  Advanced Heart Failure Team Pager [phone] (M-F; 7a - 4p)  Please contact [facility] for night-coverage after hours (4p -7a ) and weekends on [email or web address]

## 2017-05-27 NOTE — Progress Notes (Signed)
CARDIAC REHAB PHASE I   PRE:  Rate/Rhythm: 116 ST    BP: sitting 110/65    SaO2:   MODE:  Ambulation: 1400 ft   POST:  Rate/Rhythm: 124 ST    BP: sitting 105/37     SaO2:   Tolerated well, enjoyed walking. Denied SOB except toward end of walk. Increased distance today. Encouraged more walking.  [place], ACSM 05/27/2017 12:09 PM

## 2017-05-28 DIAGNOSIS — R000 Tachycardia, unspecified: Secondary | ICD-10-CM

## 2017-05-28 LAB — GLUCOSE, CAPILLARY
GLUCOSE-CAPILLARY: 243 mg/dL — AB (ref 65–99)
GLUCOSE-CAPILLARY: 300 mg/dL — AB (ref 65–99)
Glucose-Capillary: 282 mg/dL — ABNORMAL HIGH (ref 65–99)
Glucose-Capillary: 227 mg/dL — ABNORMAL HIGH (ref 65–99)
Glucose-Capillary: 249 mg/dL — ABNORMAL HIGH (ref 65–99)

## 2017-05-28 LAB — BASIC METABOLIC PANEL
ANION GAP: 8 (ref 5–15)
BUN: 17 mg/dL (ref 6–20)
CHLORIDE: 104 mmol/L (ref 101–111)
CO2: 23 mmol/L (ref 22–32)
Calcium: 9.3 mg/dL (ref 8.9–10.3)
Creatinine, Ser: 0.88 mg/dL (ref 0.44–1.00)
Glucose, Bld: 205 mg/dL — ABNORMAL HIGH (ref 65–99)
POTASSIUM: 4.3 mmol/L (ref 3.5–5.1)
SODIUM: 135 mmol/L (ref 135–145)

## 2017-05-28 LAB — COOXEMETRY PANEL
Carboxyhemoglobin: 0.8 % (ref 0.5–1.5)
METHEMOGLOBIN: 1.1 % (ref 0.0–1.5)
O2 Saturation: 60 %
TOTAL HEMOGLOBIN: 14.7 g/dL (ref 12.0–16.0)

## 2017-05-28 MED ORDER — IVABRADINE HCL 5 MG PO TABS
5.0000 mg | ORAL_TABLET | Freq: Two times a day (BID) | ORAL | Status: DC
  Administered 2017-05-28: 5 mg via ORAL
  Filled 2017-05-28 (×4): qty 1

## 2017-05-28 NOTE — Progress Notes (Signed)
Advanced Heart Failure Rounding Note  PCP:Le, Thao P, DO Cardiologist:new (Jasie Meleski) Primary Electrophysiologist:Will Meredith Leeds, MD   Subjective:    Admitted 05/22/17 from Dr. Macky Lower office with acute on chronic systolic CHF, EF 17% with severe biventricular dysfunction. Co ox 60% on Milrinone 0.125 mcg.   Weight down 19 pounds total.   Cath 6/29 with no CAD. RHC ok.   Milrinone turned off. Feel ok. No CP or SOB. Anxious to go home. Co-ox 60%. Weight down a pound.     Objective:   Weight Range: 89.8 kg (197 lb 14.4 oz) Body mass index is 31.94 kg/m.   Vital Signs:   Temp:  [98.2 F (36.8 C)-99.5 F (37.5 C)] 98.2 F (36.8 C) (07/01 1120) Pulse Rate:  [105-121] 105 (07/01 1120) Resp:  [13-20] 18 (07/01 1120) BP: (94-110)/(56-75) 94/75 (07/01 0811) SpO2:  [98 %] 98 % (07/01 0030) Weight:  [89.8 kg (197 lb 14.4 oz)] 89.8 kg (197 lb 14.4 oz) (07/01 0445) Last BM Date: 05/21/17  Weight change: Filed Weights   05/26/17 0708 05/27/17 0500 05/28/17 0445  Weight: 90.2 kg (198 lb 12.8 oz) 89.9 kg (198 lb 3.2 oz) 89.8 kg (197 lb 14.4 oz)    Intake/Output:   Intake/Output Summary (Last 24 hours) at 05/28/17 1249 Last data filed at 05/28/17 0700  Gross per 24 hour  Intake             1530 ml  Output             3000 ml  Net            -1470 ml     Physical Exam: CVP: 2-3 General:  Well appearing. No resp difficulty HEENT: normal Neck: supple. no JVD. Carotids 2+ bilat; no bruits. No lymphadenopathy or thryomegaly appreciated. Cor: PMI laterally displaced. Tachy regular. No rubs, gallops or murmurs. Lungs: clear Abdomen: soft, nontender, nondistended. No hepatosplenomegaly. No bruits or masses. Good bowel sounds. Extremities: no cyanosis, clubbing, rash, edema Neuro: alert & orientedx3, cranial nerves grossly intact. moves all 4 extremities w/o difficulty. Affect pleasant    Telemetry: Sinus tach 100-115.  Personally  reviewed    Basic Metabolic Panel  Recent Labs  05/27/17 0500 05/28/17 0345  NA 137 135  K 4.4 4.3  CL 103 104  CO2 25 23  GLUCOSE 191* 205*  BUN 12 17  CREATININE 0.86 0.88  CALCIUM 8.8* 9.3  MG 2.0  --     BNP: BNP (last 3 results)  Recent Labs  05/22/17 2014 05/22/17 2116  BNP 2,063.3* 2,030.5*      Transthoracic Echocardiography 05/22/17 Study Conclusions  - Left ventricle: The cavity size was severely dilated. Systolic   function was severely reduced. The estimated ejection fraction   was in the range of 20% to 25%. - Aortic valve: There was mild regurgitation. - Mitral valve: There was severe regurgitation. - Left atrium: The atrium was moderately dilated. - Right ventricle: Systolic function was moderately reduced. - Tricuspid valve: There was moderate regurgitation. - Pulmonary arteries: PA peak pressure: 40 mm Hg (S). - Pericardium, extracardiac: A trivial pericardial effusion was   identified.       Medications:     Scheduled Medications: . aspirin EC  81 mg Oral Daily  . atorvastatin  10 mg Oral Daily  . digoxin  0.125 mg Oral Daily  . DULoxetine  90 mg Oral Daily  . fluticasone  2 spray Each Nare Daily  . glyBURIDE  5  mg Oral BID WC  . heparin  5,000 Units Subcutaneous Q8H  . insulin aspart  0-5 Units Subcutaneous QHS  . insulin aspart  0-9 Units Subcutaneous TID WC  . insulin detemir  20 Units Subcutaneous BID  . ivabradine  2.5 mg Oral BID WC  . mouth rinse  15 mL Mouth Rinse BID  . sacubitril-valsartan  1 tablet Oral BID  . sodium chloride flush  10-40 mL Intracatheter Q12H  . sodium chloride flush  3 mL Intravenous Q12H  . sodium chloride flush  3 mL Intravenous Q12H  . spironolactone  25 mg Oral Daily    Infusions: . sodium chloride 250 mL (05/22/17 2239)  . sodium chloride      PRN Medications: sodium chloride, sodium chloride, acetaminophen, ALPRAZolam, magnesium hydroxide, ondansetron (ZOFRAN) IV, sodium chloride  flush, sodium chloride flush, sodium chloride flush   Assessment/Plan   1. Acute on chronic systolic CHF: Echo EF 19% with biventricular dysfunction. 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 DM. But will need to rule out ischemic cause. She was treated for PNA in January.  - Cath 6/29 No CAD - Milrinone stopped 6/30. Co-ox 60% - CVP low. Continue to hold lasix - Continue Spiro 25mg  daily.   - Continue digoxin 0.125 mg - Continue Entresto 24/26 mg BID.  - Co-ox marginal will not start carvedilol yet.  - Increase ivabradine to 5 bid - cMRI tomorrow. Orders placed 2. Type 2 diabetes, poorly controlled - Sugars are up. Will increase lantus per DM2 coordinator recs. Continue SSI 3. Morbid obsity - Body mass index is 31.94 kg/m.  - HF navigator has seen and educated 4. ADD - Stimulants on hold as ? Tachycardia possible etiology for HF 5. Probable OSA - According to her husband snoring has worsened over the past year.  - No change, will have sleep study outpatient.  6. Sinus tach - Increase ivabradine to 5mg  BID today.   Length of Stay: 6   Glori Bickers, MD  05/28/2017, 12:49 PM  Advanced Heart Failure Team Pager [phone] (M-F; 7a - 4p)  Please contact [facility] for night-coverage after hours (4p -7a ) and weekends on [email or web address]

## 2017-05-28 NOTE — Progress Notes (Signed)
Results for JOSSELINE, REDDIN (MRN [id number]) as of 05/28/2017 10:22  Ref. Range 05/27/2017 08:02 05/27/2017 12:02 05/27/2017 17:22 05/27/2017 20:42 05/28/2017 08:02  Glucose-Capillary Latest Ref Range: 65 - 99 mg/dL 261 (H) 336 (H) 239 (H) 198 (H) 243 (H)  Noted that blood sugars continue to be greater than 180 mg/dl.  Recommend increasing Novolog correction scale to MODERATE TID & HS. Consider adding Novolog 3-4 units TID with meals if blood sugars continue to be elevated. Will continue to monitor blood sugars while in the hospital.   Harvel Ricks RN BSN CDE Diabetes Coordinator Pager: [phone]  8am-5pm

## 2017-05-28 NOTE — Progress Notes (Signed)
Report received in patient's room via Rey RN using [employer], reviewed VS,tests, and patient's general condition, assumed care of patient.

## 2017-05-29 ENCOUNTER — Telehealth (HOSPITAL_COMMUNITY): Payer: Self-pay | Admitting: Student

## 2017-05-29 ENCOUNTER — Inpatient Hospital Stay (HOSPITAL_COMMUNITY): Payer: Managed Care, Other (non HMO)

## 2017-05-29 DIAGNOSIS — I429 Cardiomyopathy, unspecified: Secondary | ICD-10-CM

## 2017-05-29 LAB — BASIC METABOLIC PANEL
Anion gap: 8 (ref 5–15)
BUN: 15 mg/dL (ref 6–20)
CALCIUM: 9.2 mg/dL (ref 8.9–10.3)
CO2: 27 mmol/L (ref 22–32)
Chloride: 101 mmol/L (ref 101–111)
Creatinine, Ser: 0.8 mg/dL (ref 0.44–1.00)
GFR calc Af Amer: >60 mL/min (ref >60)
Glucose, Bld: 242 mg/dL — ABNORMAL HIGH (ref 65–99)
POTASSIUM: 4.5 mmol/L (ref 3.5–5.1)
SODIUM: 136 mmol/L (ref 135–145)

## 2017-05-29 LAB — COOXEMETRY PANEL
Carboxyhemoglobin: 0.9 % (ref 0.5–1.5)
METHEMOGLOBIN: 1.1 % (ref 0.0–1.5)
O2 Saturation: 67.8 %
TOTAL HEMOGLOBIN: 15.1 g/dL (ref 12.0–16.0)

## 2017-05-29 LAB — GLUCOSE, CAPILLARY
GLUCOSE-CAPILLARY: 237 mg/dL — AB (ref 65–99)
Glucose-Capillary: 203 mg/dL — ABNORMAL HIGH (ref 65–99)

## 2017-05-29 MED ORDER — ALPRAZOLAM 0.25 MG PO TABS
ORAL_TABLET | ORAL | Status: AC
  Administered 2017-05-29: 0.25 mg via ORAL
  Filled 2017-05-29: qty 1

## 2017-05-29 MED ORDER — SPIRONOLACTONE 25 MG PO TABS: 25.0000 mg | ORAL_TABLET | Freq: Every day | ORAL | 6 refills | Status: DC

## 2017-05-29 MED ORDER — IVABRADINE HCL 5 MG PO TABS: 5.0000 mg | ORAL_TABLET | Freq: Two times a day (BID) | ORAL | 6 refills | Status: DC

## 2017-05-29 MED ORDER — GADOBENATE DIMEGLUMINE 529 MG/ML IV SOLN
30.0000 mL | Freq: Once | INTRAVENOUS | Status: AC | PRN
  Administered 2017-05-29: 30 mL via INTRAVENOUS

## 2017-05-29 MED ORDER — SACUBITRIL-VALSARTAN 24-26 MG PO TABS: 1.0000 | ORAL_TABLET | Freq: Two times a day (BID) | ORAL | 6 refills | Status: DC

## 2017-05-29 MED ORDER — FUROSEMIDE 20 MG PO TABS: 20.0000 mg | ORAL_TABLET | Freq: Every day | ORAL | Status: DC

## 2017-05-29 MED ORDER — FUROSEMIDE 20 MG PO TABS: 20.0000 mg | ORAL_TABLET | Freq: Every day | ORAL | 6 refills | Status: DC

## 2017-05-29 MED ORDER — DIGOXIN 125 MCG PO TABS: 0.1250 mg | ORAL_TABLET | Freq: Every day | ORAL | 6 refills | Status: DC

## 2017-05-29 NOTE — Care Management Note (Addendum)
Case Management Note  Patient Details  Name: Renee Howell MRN: [id number] Date of Birth: [birth year]  Subjective/Objective: Pt presented for Acute Heart Failure. Pt planned for d/c home today with Entresto and CORLANOR. Benefits Check completed.                    Action/Plan: Entresto 26mg  is a covered drug no co pay for a 30 day supply the Corlanor 5mg  is a covered drug no copay for a 30 day supply they both require a prior Auth at [id number]. CM did make PA aware. Heart Failure Pharmacy to call Prior authorization. Heart Failure Team to provide 1 week supply of Corlanor. No further needs from CM at this time.   Expected Discharge Date:  05/29/17               Expected Discharge Plan:  Home/Self Care  In-House Referral:  NA  Discharge planning Services  CM Consult, Medication Assistance  Post Acute Care Choice:  NA Choice offered to:  NA  DME Arranged:  N/A DME Agency:  NA  HH Arranged:  NA HH Agency:  NA  Status of Service:  Completed, signed off  If discussed at [place] of Stay Meetings, dates discussed:    Additional Comments: 1525 05-29-17 Jacqlyn Krauss, RN,BSN [phone] Samples not available. Prior authorization called in to insurance. CM did call [employer] and not in stock. CM did call [facility] on [address] and medication is available. PA to print paper Rx's. CM did provide pt with 30 day free/ co pay card Entresto and Corlanor co pay card. No further needs from CM at this time.  Bethena Roys, RN 05/29/2017, 2:53 PM

## 2017-05-29 NOTE — Plan of Care (Signed)
Problem: Pain Managment: Goal: General experience of comfort will improve Outcome: Completed/Met Date Met: 05/29/17 Patient has remained pain free and shows no s/s of complications.

## 2017-05-29 NOTE — Progress Notes (Signed)
Advanced Heart Failure Rounding Note  PCP:Le, Thao P, DO Cardiologist:new (Yeray Tomas) Primary Electrophysiologist:Will Meredith Leeds, MD   Subjective:    Admitted 05/22/17 from Dr. Macky Lower office with acute on chronic systolic CHF, EF 54% with severe biventricular dysfunction. Co ox 60% on Milrinone 0.125 mcg.   Weight down 19 pounds total. No weight today.   Cath 6/29 with no CAD. RHC ok.   Coox 67.8 % this am OFF milrinone. Creatinine and K stable.   PICC line clotted. cMRI completed. HR improved to 104  Objective:   Weight Range: 197 lb 14.4 oz (89.8 kg) Body mass index is 31.94 kg/m.   Vital Signs:   Temp:  [97.9 F (36.6 C)-98.8 F (37.1 C)] 98 F (36.7 C) (07/02 0744) Pulse Rate:  [101-117] 101 (07/02 0744) Resp:  [15-22] 16 (07/02 0744) BP: (92-122)/(58-80) 108/69 (07/02 0744) SpO2:  [98 %] 98 % (07/02 0744) Last BM Date: 05/28/17  Weight change: Filed Weights   05/26/17 0708 05/27/17 0500 05/28/17 0445  Weight: 198 lb 12.8 oz (90.2 kg) 198 lb 3.2 oz (89.9 kg) 197 lb 14.4 oz (89.8 kg)    Intake/Output:   Intake/Output Summary (Last 24 hours) at 05/29/17 1029 Last data filed at 05/29/17 0400  Gross per 24 hour  Intake              360 ml  Output             2000 ml  Net            -1640 ml    Physical Exam    General:  Well appearing. Lying in bed No resp difficulty HEENT: normal Neck: supple. no JVD. Carotids 2+ bilat; no bruits. No lymphadenopathy or thryomegaly appreciated. Cor: PMI laterally displaced. Tachy regular. No rubs, gallops or murmurs. Lungs: clear Abdomen: soft, nontender, nondistended. No hepatosplenomegaly. No bruits or masses. Good bowel sounds. Extremities: no cyanosis, clubbing, rash, edema Neuro: alert & orientedx3, cranial nerves grossly intact. moves all 4 extremities w/o difficulty. Affect pleasant   Telemetry   Personally reviewed, sinus tach 100-105  EKG    Not ordered today.     Lab   Basic Metabolic Panel  Recent Labs  05/27/17 0500 05/28/17 0345 05/29/17 0451  NA 137 135 136  K 4.4 4.3 4.5  CL 103 104 101  CO2 25 23 27   GLUCOSE 191* 205* 242*  BUN 12 17 15   CREATININE 0.86 0.88 0.80  CALCIUM 8.8* 9.3 9.2  MG 2.0  --   --    BNP: BNP (last 3 results)  Recent Labs  05/22/17 2014 05/22/17 2116  BNP 2,063.3* 2,030.5*   Transthoracic Echocardiography 05/22/17 Study Conclusions  - Left ventricle: The cavity size was severely dilated. Systolic   function was severely reduced. The estimated ejection fraction   was in the range of 20% to 25%. - Aortic valve: There was mild regurgitation. - Mitral valve: There was severe regurgitation. - Left atrium: The atrium was moderately dilated. - Right ventricle: Systolic function was moderately reduced. - Tricuspid valve: There was moderate regurgitation. - Pulmonary arteries: PA peak pressure: 40 mm Hg (S). - Pericardium, extracardiac: A trivial pericardial effusion was   identified.  Medications:     Scheduled Medications: . aspirin EC  81 mg Oral Daily  . atorvastatin  10 mg Oral Daily  . digoxin  0.125 mg Oral Daily  . DULoxetine  90 mg Oral Daily  . fluticasone  2 spray Each Nare  Daily  . glyBURIDE  5 mg Oral BID WC  . heparin  5,000 Units Subcutaneous Q8H  . insulin aspart  0-5 Units Subcutaneous QHS  . insulin aspart  0-9 Units Subcutaneous TID WC  . insulin detemir  20 Units Subcutaneous BID  . ivabradine  5 mg Oral BID WC  . mouth rinse  15 mL Mouth Rinse BID  . sacubitril-valsartan  1 tablet Oral BID  . sodium chloride flush  10-40 mL Intracatheter Q12H  . sodium chloride flush  3 mL Intravenous Q12H  . sodium chloride flush  3 mL Intravenous Q12H  . spironolactone  25 mg Oral Daily    Infusions: . sodium chloride 250 mL (05/22/17 2239)  . sodium chloride      PRN Medications: sodium chloride, sodium chloride, acetaminophen, ALPRAZolam, magnesium hydroxide, ondansetron  (ZOFRAN) IV, sodium chloride flush, sodium chloride flush, sodium chloride flush   Assessment/Plan   1. Acute on chronic systolic CHF: Echo EF 97% with biventricular dysfunction. 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 DM. But will need to rule out ischemic cause. She was treated for PNA in January.  - Cath 6/29 No CAD - Coox 67.8% off milrinone. Milrinone stopped 6/30.  - Volume status looks good  - Continue Spiro 25mg  daily.   - Continue digoxin 0.125 mg - Continue Entresto 24/26 mg BID.  - No BB with recent marginal/low coox.   - Continue ivabradine 5 bid - cMRI completed today. Await report  2. Type 2 diabetes, poorly controlled - Appreciate DM2 coordinator recs. Continue SSI.  3. Morbid obsity - Body mass index is 31.94 kg/m.  - HF navigator has seen and educated. No change.  4. ADD - Stimulants on hold as ? Tachycardia possible etiology for HF. No change.  5. Probable OSA - According to her husband snoring has worsened over the past year.  - No change. Plan sleep study as outpatient.  6. Sinus tach - Continue ivabradine 5 mg BID.   Length of Stay: Rafael Capo, [place]  05/29/2017, 10:29 AM  Advanced Heart Failure Team Pager [phone] (M-F; 7a - 4p)  Please contact [facility] for night-coverage after hours (4p -7a ) and weekends on [email or web address]  Patient seen and examined with the above-signed Advanced Practice Provider and/or Housestaff. I personally reviewed laboratory data, imaging studies and relevant notes. I independently examined the patient and formulated the important aspects of the plan. I have edited the note to reflect any of my changes or salient points. I have personally discussed the plan with the patient and/or family.  She is much improved. Can go home today. Would add lasix 20mg  daily. Await cMRI results.   F/u in HF Clinic on Friday.   Glori Bickers, MD  1:51 PM

## 2017-05-29 NOTE — Progress Notes (Signed)
Patient discharged today. PICC line removed by MD Bensimhon. Pressure dressing placed by this RN . IV team updated/charge RN updated.

## 2017-05-29 NOTE — Telephone Encounter (Signed)
   Prior authorization completed for patient over the phone.    Corlanor 5 mg BID approved from 04/29/2017 -> 05/29/2018 Authorization number [id number]  Entresto 24/26 mg BID approved from 04/29/2017 -> 05/29/2018 Authorization number [id number]   Beryle Beams" [place], PA-C 05/29/2017 3:40 PM

## 2017-05-30 ENCOUNTER — Telehealth (HOSPITAL_COMMUNITY): Payer: Self-pay | Admitting: Cardiology

## 2017-05-30 NOTE — Telephone Encounter (Signed)
CHF Clinic appointment reminder call placed to patient for upcoming post-hospital follow up.  Does understand purpose of this appointment and where CHF Clinic is located? YES, DETAILED PARKING INSTRUCTIONS GIVEN TO PATIENT  How is patient feeling? FEELS GOOD WEIGHT STABLE AT 195, MILD SOB, DENIES CHEST PAINS   Does patient have all of their medications since their recent discharge? YES, PATIENT REPORTS IT WAS A "CHORE" HOWEVER SHE DID FINALLY GET ALL MEDS   Patient also reminded to take all medications as prescribed on the day of his/her appointment and to bring all medications to this appointment.  Advised to call our office for tardiness or cancellations/rescheduling needs.  Cristal Qadir, CMA

## 2017-06-02 ENCOUNTER — Encounter (HOSPITAL_COMMUNITY): Payer: Self-pay

## 2017-06-02 ENCOUNTER — Telehealth (HOSPITAL_COMMUNITY): Payer: Self-pay

## 2017-06-02 ENCOUNTER — Ambulatory Visit (HOSPITAL_COMMUNITY)
Admission: RE | Admit: 2017-06-02 | Discharge: 2017-06-02 | Disposition: A | Discharge status: Home / Self Care | Payer: Managed Care, Other (non HMO) | Source: Ambulatory Visit | Attending: Cardiology | Admitting: Cardiology

## 2017-06-02 VITALS — BP 114/64 | HR 94 | Wt 188.4 lb

## 2017-06-02 DIAGNOSIS — Z6830 Body mass index (BMI) 30.0-30.9, adult: Secondary | ICD-10-CM | POA: Diagnosis not present

## 2017-06-02 DIAGNOSIS — E669 Obesity, unspecified: Secondary | ICD-10-CM | POA: Diagnosis not present

## 2017-06-02 DIAGNOSIS — Z794 Long term (current) use of insulin: Secondary | ICD-10-CM | POA: Diagnosis not present

## 2017-06-02 DIAGNOSIS — I429 Cardiomyopathy, unspecified: Secondary | ICD-10-CM | POA: Diagnosis not present

## 2017-06-02 DIAGNOSIS — Z833 Family history of diabetes mellitus: Secondary | ICD-10-CM | POA: Diagnosis not present

## 2017-06-02 DIAGNOSIS — Z91040 Latex allergy status: Secondary | ICD-10-CM | POA: Diagnosis not present

## 2017-06-02 DIAGNOSIS — G4730 Sleep apnea, unspecified: Secondary | ICD-10-CM | POA: Diagnosis not present

## 2017-06-02 DIAGNOSIS — Z888 Allergy status to other drugs, medicaments and biological substances status: Secondary | ICD-10-CM | POA: Insufficient documentation

## 2017-06-02 DIAGNOSIS — I5022 Chronic systolic (congestive) heart failure: Secondary | ICD-10-CM | POA: Diagnosis not present

## 2017-06-02 DIAGNOSIS — E869 Volume depletion, unspecified: Secondary | ICD-10-CM | POA: Insufficient documentation

## 2017-06-02 DIAGNOSIS — I5041 Acute combined systolic (congestive) and diastolic (congestive) heart failure: Secondary | ICD-10-CM

## 2017-06-02 DIAGNOSIS — E119 Type 2 diabetes mellitus without complications: Secondary | ICD-10-CM | POA: Diagnosis not present

## 2017-06-02 DIAGNOSIS — I110 Hypertensive heart disease with heart failure: Secondary | ICD-10-CM | POA: Insufficient documentation

## 2017-06-02 DIAGNOSIS — R000 Tachycardia, unspecified: Secondary | ICD-10-CM | POA: Diagnosis not present

## 2017-06-02 DIAGNOSIS — Z809 Family history of malignant neoplasm, unspecified: Secondary | ICD-10-CM | POA: Insufficient documentation

## 2017-06-02 LAB — BASIC METABOLIC PANEL
Anion gap: 10 (ref 5–15)
BUN: 29 mg/dL — AB (ref 6–20)
CALCIUM: 9.7 mg/dL (ref 8.9–10.3)
CO2: 25 mmol/L (ref 22–32)
CREATININE: 1.23 mg/dL — AB (ref 0.44–1.00)
Chloride: 99 mmol/L — ABNORMAL LOW (ref 101–111)
GFR calc Af Amer: 57 mL/min — ABNORMAL LOW (ref >60)
GFR calc non Af Amer: 50 mL/min — ABNORMAL LOW (ref >60)
GLUCOSE: 218 mg/dL — AB (ref 65–99)
Potassium: 4.6 mmol/L (ref 3.5–5.1)
Sodium: 134 mmol/L — ABNORMAL LOW (ref 135–145)

## 2017-06-02 LAB — DIGOXIN LEVEL: Digoxin Level: 0.4 ng/mL — ABNORMAL LOW (ref 0.8–2.0)

## 2017-06-02 MED ORDER — ONDANSETRON HCL 8 MG PO TABS: 8.0000 mg | ORAL_TABLET | Freq: Three times a day (TID) | ORAL | 0 refills | Status: DC | PRN

## 2017-06-02 NOTE — Patient Instructions (Addendum)
STOP Lasix.  Take Zofran 8mg  by mouth every 8 hours as needed for nausea.   Follow up next week.

## 2017-06-02 NOTE — Progress Notes (Signed)
Advanced Heart Failure Clinic Note   Referring Physician: Primary Care: Primary Cardiologist:  HPI: Renee Howell a 51 y.o.female with a h/o obesity, HTN. ADD and poorly-controlled DM2, chronic systolic CHF (EF 42-70%).    Diagnosed in June 2018 with NICM, systolic CHF. She presented to Dr. Macky Lower office (for an evaluation of tachycardia)and reported having significant issues with her breathing over the past 6 months. Had recently had PNA a few months prior. She was found to have an EF of 15-20%. Was markedly overloaded in his office and therefore hospital admission was reccommeded.   Admitted 05/22/17-05/29/17. Required milrinone for low output HF. Diuresed over 20 pounds with milrinone and IV Lasix. RHC showed Fick output/index 4.4/2.2 on 0.125 mcg Milrinone. Cardiac MRI without infiltrative disease. Milrinone weaned off. It was felt that her cardiomyopathy was related to either undiagnosed sleep apnea, viral or tachy induced (she has been on stimulants for ADD for a long time). Started on corlanor for tachycardia, although tachycardia likely represented the low output HF as well. Her stimulants were stopped at discharge. Discharge weight was 197 pounds.    She returns today for post hospital follow up. Says that overall she feels much better than when she was first admitted. However, she has felt nauseous most days since leaving the hospital. Has had a few episodes of vomiting. Yesterday, she had an episode of vomiting that she said came without warning. Feels weak. Taking all medications as prescribed. Her weight is down 9 pounds since discharge. Blood sugars are well controlled, no hypoglycemia. Eating small meals throughout the day. Drinking less than 2L a day. Appears pale. Denies SOB with activity. No SOB with ADL's.      Past Medical History:  Diagnosis Date  . Allergy   . Diabetes mellitus   . Hypertension   . Kidney infection     Current Outpatient Prescriptions   Medication Sig Dispense Refill  . Albuterol Sulfate (PROAIR RESPICLICK) 623 (90 Base) MCG/ACT AEPB Inhale 2 puffs into the lungs every 6 (six) hours as needed (for shortness of breath).    Marland Kitchen atorvastatin (LIPITOR) 10 MG tablet Take 10 mg by mouth daily.    . budesonide-formoterol (SYMBICORT) 80-4.5 MCG/ACT inhaler Inhale 2 puffs into the lungs daily as needed (for shortness of breath).     . busPIRone (BUSPAR) 7.5 MG tablet Take 7.5 mg by mouth 2 (two) times daily.    . cholecalciferol (VITAMIN D) 1000 units tablet Take 2,000 Units by mouth daily.    . digoxin (LANOXIN) 0.125 MG tablet Take 1 tablet (0.125 mg total) by mouth daily. 30 tablet 6  . DULoxetine (CYMBALTA) 30 MG capsule Take 90 mg by mouth daily.    . Exenatide ER (BYDUREON) 2 MG PEN Inject 2 mg into the skin every Saturday.    . fluticasone (FLONASE) 50 MCG/ACT nasal spray Place 2 sprays into both nostrils daily.    . furosemide (LASIX) 20 MG tablet Take 1 tablet (20 mg total) by mouth daily. Hold with dizziness. Take 1 extra tablet with increase weight/edema. 40 tablet 6  . glyBURIDE (DIABETA) 5 MG tablet Take 10 mg by mouth 2 (two) times daily.     . insulin detemir (LEVEMIR) 100 UNIT/ML injection Inject 40 Units into the skin 2 (two) times daily.     . insulin lispro (HUMALOG) 100 UNIT/ML injection Inject 2-8 Units into the skin 3 (three) times daily as needed for high blood sugar. Per sliding scale    . ivabradine (CORLANOR)  5 MG TABS tablet Take 1 tablet (5 mg total) by mouth 2 (two) times daily with a meal. 60 tablet 6  . metFORMIN (GLUCOPHAGE) 1000 MG tablet Take 1,000 mg by mouth 2 (two) times daily.    . ranitidine (ZANTAC) 150 MG tablet Take 150 mg by mouth daily.    . sacubitril-valsartan (ENTRESTO) 24-26 MG Take 1 tablet by mouth 2 (two) times daily. 60 tablet 6  . spironolactone (ALDACTONE) 25 MG tablet Take 1 tablet (25 mg total) by mouth daily. 30 tablet 6  . Tiotropium Bromide Monohydrate (SPIRIVA RESPIMAT) 1.25  MCG/ACT AERS Inhale 2 puffs into the lungs daily as needed (for shortness of breath).    . Lactobacillus ([place] PRO-B) CAPS Take 1 capsule by mouth daily.    . montelukast (SINGULAIR) 10 MG tablet Take 10 mg by mouth daily.  0   No current facility-administered medications for this encounter.     Allergies  Allergen Reactions  . Trulicity [Dulaglutide] Other (See Comments)    Pt states that the trulicity makes her feel horrible three days after she uses it. States she will never take it again.   . Latex Rash      Social History   Social History  . Marital status: Married    Spouse name: N/A  . Number of children: N/A  . Years of education: N/A   Occupational History  . Not on file.   Social History Main Topics  . Smoking status: Never Smoker  . Smokeless tobacco: Never Used  . Alcohol use Yes     Comment: occ  . Drug use: No  . Sexual activity: Not on file   Other Topics Concern  . Not on file   Social History Narrative  . No narrative on file      Family History  Problem Relation Age of Onset  . Cancer Mother   . Diabetes Father     Vitals:   06/02/17 0839  BP: 114/64  Pulse: 94  SpO2: 100%  Weight: 188 lb 6 oz (85.4 kg)     PHYSICAL EXAM: General:  Fatigued appearing female, NAD. Walked into clinic.  HEENT: normal Neck: supple. JVP flat. Carotids 2+ bilat; no bruits. No lymphadenopathy or thyromegaly appreciated. Cor: PMI nondisplaced. Regular rate & rhythm. No rubs, gallops or murmurs. Lungs: clear bilaterally. Normal effort.  Abdomen: soft, nontender, nondistended. No hepatosplenomegaly. No bruits or masses. Good bowel sounds. Extremities: no cyanosis, clubbing, rash, edema. Skin dry.  Neuro: alert & oriented x 3, cranial nerves grossly intact. moves all 4 extremities w/o difficulty. Affect pleasant.    ASSESSMENT & PLAN: 1. Chronic systolic CHF: Echo EF 24% with biventricular dysfunction. 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 DM.  Normal cors by cath in 04/2017. Cardiac MRI without infiltrative disease.  - NYHA II - Appears dry. [place] STAT BMET. Creatinine up to 1.23. Stop Lasix. Told her to drink more fluid. - Continue Spiro 25 mg daily.  - Continue digoxin, drew stat dig level to rule out toxicity.   - Continue Entresto 24/26 mg BID. Will not increase today, as she is dry as above.  - No beta blocker yet. Can likely add at next visit.  - Continue ivabradine 5 bid  2. DM - Restarted her DM medications. Blood sugars well controlled.  - Follows with PCP   3. ADD - No longer on stimulants.   5. Probable OSA - Will set up sleep study, need to order at next visit.  6. Sinus tach - Improved, continue corlanor 5 mg BID.   Follow up next week. Lasix stopped, she is volume depleted but remains tenuous. Need to order sleep study next visit.    Arbutus Leas, NP 06/02/17

## 2017-06-02 NOTE — Telephone Encounter (Signed)
Patient insurance is active and benefits verified. Patient has [employer]  - no co-payment, deductible $2350/$2350 has been met, out of pocket $5000/$5000 has been met, 10% co-insurance, no pre-authorization and no limit on visit. Passport/reference [phone].

## 2017-06-09 ENCOUNTER — Ambulatory Visit (HOSPITAL_COMMUNITY)
Admission: RE | Admit: 2017-06-09 | Discharge: 2017-06-09 | Disposition: A | Discharge status: Home / Self Care | Payer: Managed Care, Other (non HMO) | Source: Ambulatory Visit | Attending: Internal Medicine | Admitting: Internal Medicine

## 2017-06-09 ENCOUNTER — Encounter (HOSPITAL_COMMUNITY): Payer: Self-pay

## 2017-06-09 VITALS — BP 96/60 | HR 85 | Wt 190.1 lb

## 2017-06-09 DIAGNOSIS — R000 Tachycardia, unspecified: Secondary | ICD-10-CM

## 2017-06-09 DIAGNOSIS — I5022 Chronic systolic (congestive) heart failure: Secondary | ICD-10-CM | POA: Diagnosis not present

## 2017-06-09 DIAGNOSIS — I110 Hypertensive heart disease with heart failure: Secondary | ICD-10-CM | POA: Diagnosis not present

## 2017-06-09 DIAGNOSIS — Z833 Family history of diabetes mellitus: Secondary | ICD-10-CM | POA: Diagnosis not present

## 2017-06-09 DIAGNOSIS — Z794 Long term (current) use of insulin: Secondary | ICD-10-CM | POA: Diagnosis not present

## 2017-06-09 DIAGNOSIS — I428 Other cardiomyopathies: Secondary | ICD-10-CM | POA: Insufficient documentation

## 2017-06-09 DIAGNOSIS — I10 Essential (primary) hypertension: Secondary | ICD-10-CM | POA: Diagnosis present

## 2017-06-09 DIAGNOSIS — Z7951 Long term (current) use of inhaled steroids: Secondary | ICD-10-CM | POA: Diagnosis not present

## 2017-06-09 DIAGNOSIS — Z79899 Other long term (current) drug therapy: Secondary | ICD-10-CM | POA: Diagnosis not present

## 2017-06-09 DIAGNOSIS — Z888 Allergy status to other drugs, medicaments and biological substances status: Secondary | ICD-10-CM | POA: Insufficient documentation

## 2017-06-09 DIAGNOSIS — E119 Type 2 diabetes mellitus without complications: Secondary | ICD-10-CM

## 2017-06-09 DIAGNOSIS — Z91040 Latex allergy status: Secondary | ICD-10-CM | POA: Diagnosis not present

## 2017-06-09 DIAGNOSIS — I5020 Unspecified systolic (congestive) heart failure: Secondary | ICD-10-CM | POA: Insufficient documentation

## 2017-06-09 LAB — BASIC METABOLIC PANEL
Anion gap: 10 (ref 5–15)
BUN: 16 mg/dL (ref 6–20)
CHLORIDE: 100 mmol/L — AB (ref 101–111)
CO2: 26 mmol/L (ref 22–32)
CREATININE: 0.99 mg/dL (ref 0.44–1.00)
Calcium: 9.5 mg/dL (ref 8.9–10.3)
GFR calc non Af Amer: >60 mL/min (ref >60)
Glucose, Bld: 239 mg/dL — ABNORMAL HIGH (ref 65–99)
POTASSIUM: 5.1 mmol/L (ref 3.5–5.1)
Sodium: 136 mmol/L (ref 135–145)

## 2017-06-09 NOTE — Patient Instructions (Addendum)
Routine lab work today. Will notify you of abnormal results, otherwise no news is good news!  No changes to medication at this time.  Follow up 3-4 weeks with CHF Clinical Pharmacist Ileene Patrick.  Follow up 2 months with Dr. Haroldine Laws.  Take all medication as prescribed the day of your appointment. Bring all medications with you to your appointment.  Do the following things EVERYDAY: 1) Weigh yourself in the morning before breakfast. Write it down and keep it in a log. 2) Take your medicines as prescribed 3) Eat low salt foods-Limit salt (sodium) to 2000 mg per day.  4) Stay as active as you can everyday 5) Limit all fluids for the day to less than 2 liters

## 2017-06-09 NOTE — Progress Notes (Signed)
Advanced Heart Failure Clinic Note   Primary Cardiologist: Dr. Haroldine Laws   HPI: Renee Howell a 51 y.o.female with a h/o obesity, HTN. ADD and poorly-controlled DM2, chronic systolic CHF (EF 24-09%).    Diagnosed in June 2018 with NICM, systolic CHF. She presented to Dr. Macky Lower office (for an evaluation of tachycardia)and reported having significant issues with her breathing over the past 6 months. Had recently had PNA a few months prior. She was found to have an EF of 15-20%. Was markedly overloaded in his office and therefore hospital admission was reccommeded.   Admitted 05/22/17-05/29/17. Required milrinone for low output HF. Diuresed over 20 pounds with milrinone and IV Lasix. RHC showed Fick output/index 4.4/2.2 on 0.125 mcg Milrinone. Cardiac MRI without infiltrative disease. Milrinone weaned off. It was felt that her cardiomyopathy was related to either undiagnosed sleep apnea, viral or tachy induced (she has been on stimulants for ADD for a long time). Started on corlanor for tachycardia, although tachycardia likely represented the low output HF as well. Her stimulants were stopped at discharge. Discharge weight was 197 pounds.    She returns today for follow up. Last week lasix was stopped due to dry fluid status. Up 2 lbs. Taking lasix as needed now. Took one yesterday.   Appetite improved. Occasional lightheadedness with rapid standing but not marked or limiting. Weight at home 191-192.  Blood sugars remain low. PCP yesterday adjusted her medications. Blood sugar 54 this am. So still working on.  Watching salt and fluid intake.  No SOB with ADLs. Mild SOB when it gets hot outside.  No exertional chest pain.  Occasional has discomfort between her ribs, worse when she is wearing a bra.    Past Medical History:  Diagnosis Date  . Allergy   . Diabetes mellitus   . Hypertension   . Kidney infection     Current Outpatient Prescriptions  Medication Sig Dispense Refill  .  Albuterol Sulfate (PROAIR RESPICLICK) 735 (90 Base) MCG/ACT AEPB Inhale 2 puffs into the lungs every 6 (six) hours as needed (for shortness of breath).    Marland Kitchen atorvastatin (LIPITOR) 10 MG tablet Take 10 mg by mouth daily.    . budesonide-formoterol (SYMBICORT) 80-4.5 MCG/ACT inhaler Inhale 2 puffs into the lungs daily as needed (for shortness of breath).     . busPIRone (BUSPAR) 7.5 MG tablet Take 7.5 mg by mouth 2 (two) times daily.    . cholecalciferol (VITAMIN D) 1000 units tablet Take 2,000 Units by mouth daily.    . digoxin (LANOXIN) 0.125 MG tablet Take 1 tablet (0.125 mg total) by mouth daily. 30 tablet 6  . DULoxetine (CYMBALTA) 30 MG capsule Take 90 mg by mouth daily.    . Exenatide ER (BYDUREON) 2 MG PEN Inject 2 mg into the skin every Saturday.    . fluticasone (FLONASE) 50 MCG/ACT nasal spray Place 2 sprays into both nostrils daily.    Marland Kitchen glyBURIDE (DIABETA) 5 MG tablet Take 10 mg by mouth 2 (two) times daily.     . insulin detemir (LEVEMIR) 100 UNIT/ML injection Inject 40 Units into the skin 2 (two) times daily.     . insulin lispro (HUMALOG) 100 UNIT/ML injection Inject 2-8 Units into the skin 3 (three) times daily as needed for high blood sugar. Per sliding scale    . ivabradine (CORLANOR) 5 MG TABS tablet Take 1 tablet (5 mg total) by mouth 2 (two) times daily with a meal. 60 tablet 6  . Lactobacillus ([place] PRO-B)  CAPS Take 1 capsule by mouth daily.    . metFORMIN (GLUCOPHAGE) 1000 MG tablet Take 1,000 mg by mouth 2 (two) times daily.    . montelukast (SINGULAIR) 10 MG tablet Take 10 mg by mouth daily.  0  . ondansetron (ZOFRAN) 8 MG tablet Take 1 tablet (8 mg total) by mouth every 8 (eight) hours as needed for nausea or vomiting. 10 tablet 0  . ranitidine (ZANTAC) 150 MG tablet Take 150 mg by mouth daily.    . sacubitril-valsartan (ENTRESTO) 24-26 MG Take 1 tablet by mouth 2 (two) times daily. 60 tablet 6  . spironolactone (ALDACTONE) 25 MG tablet Take 1 tablet (25 mg total) by  mouth daily. 30 tablet 6  . Tiotropium Bromide Monohydrate (SPIRIVA RESPIMAT) 1.25 MCG/ACT AERS Inhale 2 puffs into the lungs daily as needed (for shortness of breath).     No current facility-administered medications for this encounter.     Allergies  Allergen Reactions  . Trulicity [Dulaglutide] Other (See Comments)    Pt states that the trulicity makes her feel horrible three days after she uses it. States she will never take it again.   . Latex Rash      Social History   Social History  . Marital status: Married    Spouse name: N/A  . Number of children: N/A  . Years of education: N/A   Occupational History  . Not on file.   Social History Main Topics  . Smoking status: Never Smoker  . Smokeless tobacco: Never Used  . Alcohol use Yes     Comment: occ  . Drug use: No  . Sexual activity: Not on file   Other Topics Concern  . Not on file   Social History Narrative  . No narrative on file      Family History  Problem Relation Age of Onset  . Cancer Mother   . Diabetes Father     Vitals:   06/09/17 0859  BP: 96/60  Pulse: 85  SpO2: 100%  Weight: 190 lb 2 oz (86.2 kg)   Wt Readings from Last 3 Encounters:  06/09/17 190 lb 2 oz (86.2 kg)  06/02/17 188 lb 6 oz (85.4 kg)  05/28/17 197 lb 14.4 oz (89.8 kg)    PHYSICAL EXAM: General: Well appearing. No resp difficulty. HEENT: Normal Neck: Supple. JVP 5-6. Carotids 2+ bilat; no bruits. No thyromegaly or nodule noted. Cor: PMI nondisplaced. RRR, No M/G/R noted Lungs: CTAB, normal effort. Abdomen: Soft, non-tender, non-distended, no HSM. No bruits or masses. +BS  Extremities: No cyanosis, clubbing, rash, R and LLE no edema.  Neuro: Alert & orientedx3, cranial nerves grossly intact. moves all 4 extremities w/o difficulty. Affect pleasant   ASSESSMENT & PLAN: 1. Chronic systolic CHF: Echo EF 96% with biventricular dysfunction. 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 DM. Normal cors by cath in 04/2017.  Cardiac MRI without infiltrative disease.  - NYHA III symptoms.  - Volume status stable on exam.  - Continue lasix 20 mg AS NEEDED only. BMET today.  - Continue Spiro 25 mg daily.  - Continue digoxin 0.125 mg daily.  - Continue Entresto 24/26 mg BID. No room to up-titrate.   - No beta blocker yet. Consider low dose at next visit.  - Continue ivabradine 5 bid  2. DM - Blood sugars well controlled to low. Following with CP.    3. ADD - No longer on stimulants. No change.   5. Probable OSA -  Will order  sleep study.    6. Sinus tach - Stable on corlanor 5 mg BID.   Labs order. Order sleep study. RTC 3-4 weeks for Pharm-D led med-titration. RTC 2 months for MD visit.   Shirley Friar, PA-C 06/09/17   Greater than 50% of the 25 minute visit was spent in counseling/coordination of care regarding disease state education, sliding scale diuretics, symptoms of hypotension and orthostasis, and medication reconciliation.

## 2017-06-12 ENCOUNTER — Telehealth: Payer: Self-pay | Admitting: Internal Medicine

## 2017-06-12 ENCOUNTER — Telehealth (HOSPITAL_COMMUNITY): Payer: Self-pay

## 2017-06-12 NOTE — Telephone Encounter (Signed)
CY is there any way we can move up this consult? Thanks!

## 2017-06-12 NOTE — Telephone Encounter (Signed)
Basic metabolic panel  Order: [id number]  Status:  Final result Visible to patient:  No (Not Released) Dx:  Chronic systolic heart failure ([place])  Notes recorded by Effie Berkshire, RN on 06/12/2017 at 10:34 AM EDT LVMTCB. ------  Notes recorded by Darron Doom, RN on 06/09/2017 at 11:13 AM EDT Called and left message asking for her to call us back. ------  Notes recorded by Shirley Friar, PA-C on 06/09/2017 at 11:06 AM EDT Please make sure she is not taking any potassium supplementation.  Ask her to avoid high potassium foods like bananas, strawberries, spinach, etc. Limit Mrs Deliah Boston seasoning if using.   Please recheck 7-10 days.   Legrand Como [address]" [place], PA-C 06/09/2017 11:05 AM

## 2017-06-12 NOTE — Telephone Encounter (Signed)
Called Dr. Gus Puma office and spoke with the referral coordinator and she states they wanted the patient to have a sooner sleep consults appt due to the patient being recently hospitalized at [facility] and the patient having sleep apnea and CHF. Per CY will await for Katie to return to see where we can fit this patient in at

## 2017-06-12 NOTE — Telephone Encounter (Signed)
I don't know what the consult is for. Please hold this and bring it up with Katie to look at my schedule when she gets back.

## 2017-06-13 NOTE — Telephone Encounter (Signed)
Please have patient come in on Thursday 06/15/17 at 11:00am for Consult-okay for over booking. Thanks.

## 2017-06-13 NOTE — Telephone Encounter (Signed)
Called the office back but was placed on hold too long. Will call back later.

## 2017-06-14 NOTE — Telephone Encounter (Signed)
Spoke with Programmer, multimedia at [place] at [facility]. Pt has been scheduled to see CY on 06/15/2017 at 11am. Nothing further was needed.

## 2017-06-15 ENCOUNTER — Ambulatory Visit (INDEPENDENT_AMBULATORY_CARE_PROVIDER_SITE_OTHER): Payer: Managed Care, Other (non HMO) | Admitting: Internal Medicine

## 2017-06-15 ENCOUNTER — Encounter: Payer: Self-pay | Admitting: Internal Medicine

## 2017-06-15 DIAGNOSIS — G4733 Obstructive sleep apnea (adult) (pediatric): Secondary | ICD-10-CM | POA: Diagnosis not present

## 2017-06-15 DIAGNOSIS — F5101 Primary insomnia: Secondary | ICD-10-CM

## 2017-06-15 MED ORDER — ZALEPLON 5 MG PO CAPS: ORAL_CAPSULE | ORAL | 2 refills | Status: DC

## 2017-06-15 NOTE — Progress Notes (Signed)
06/15/17-51 year old female never smoker for sleep evaluation. 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 No CPAP set up.  Medical problems include systolic CHF with severe biventricular failure followed by cardiology, HBP, ovarian cancer, DM 2 NPSG /[facility] for 5 years ago. Report pending. Tried mouthpiece and didn't like it. Husband says she does not snore loudly and he does not notice that she stops breathing.. She reports she is sleeping very much better since congestive heart failure was found and treated. She has diuresed 25 pounds in the last few weeks. Her primary complaint had been insomnia with daytime sleepiness treated with Adderall. Cardiology had her stop the Adderall. Tylenol PM has worked fairly well taken occasionally for sleep. Occasional vivid dreams. Restless legs. Bedtime around 11 PM with variable sleep latency, waking 3 or 4 times before up between 5:30 and 8 AM. ENT surgery-tonsils Epworth score 15/24  Prior to Admission medications   Medication Sig Start Date End Date Taking? Authorizing Provider  Albuterol Sulfate (PROAIR RESPICLICK) 462 (90 Base) MCG/ACT AEPB Inhale 2 puffs into the lungs every 6 (six) hours as needed (for shortness of breath).   Yes [provider]  atorvastatin (LIPITOR) 10 MG tablet Take 10 mg by mouth daily.   Yes [provider]  budesonide-formoterol (SYMBICORT) 80-4.5 MCG/ACT inhaler Inhale 2 puffs into the lungs daily as needed (for shortness of breath).    Yes [provider]  busPIRone (BUSPAR) 7.5 MG tablet Take 7.5 mg by mouth 2 (two) times daily.   Yes [provider]  cholecalciferol (VITAMIN D) 1000 units tablet Take 2,000 Units by mouth daily.   Yes [provider]  digoxin (LANOXIN) 0.125 MG tablet Take 1 tablet (0.125 mg total) by mouth daily. 05/30/17  Yes Shirley Friar, PA-C  DULoxetine (CYMBALTA) 30 MG  capsule Take 90 mg by mouth daily.   Yes [provider]  fluticasone (FLONASE) 50 MCG/ACT nasal spray Place 2 sprays into both nostrils daily.   Yes [provider]  furosemide (LASIX) 20 MG tablet TK 1 T PO  D. HOLD WITH DIZZINESS. TK 1 EXTRA T WITH INCREASE WEIGHT/EDEMA 05/29/17  Yes [provider]  glyBURIDE (DIABETA) 5 MG tablet Take 5 mg by mouth 2 (two) times daily.    Yes [provider]  insulin detemir (LEVEMIR) 100 UNIT/ML injection Inject 30 Units into the skin 2 (two) times daily.    Yes [provider]  insulin lispro (HUMALOG) 100 UNIT/ML injection Inject 2-8 Units into the skin 3 (three) times daily as needed for high blood sugar. Per sliding scale   Yes [provider]  ivabradine (CORLANOR) 5 MG TABS tablet Take 1 tablet (5 mg total) by mouth 2 (two) times daily with a meal. 05/29/17  Yes Tillery, Satira Mccallum, PA-C  metFORMIN (GLUCOPHAGE) 1000 MG tablet Take 1,000 mg by mouth 2 (two) times daily.   Yes [provider]  montelukast (SINGULAIR) 10 MG tablet Take 10 mg by mouth daily. 04/26/17  Yes [provider]  ondansetron (ZOFRAN) 8 MG tablet Take 1 tablet (8 mg total) by mouth every 8 (eight) hours as needed for nausea or vomiting. 06/02/17  Yes Arbutus Leas, NP  ranitidine (ZANTAC) 150 MG tablet Take 150 mg by mouth daily.   Yes [provider]  sacubitril-valsartan (ENTRESTO) 24-26 MG Take 1 tablet by mouth 2 (two) times daily. 05/29/17  Yes Shirley Friar, PA-C  spironolactone (ALDACTONE) 25 MG tablet Take 1 tablet (25 mg total) by mouth daily. 05/30/17  Yes Shirley Friar, PA-C  Tiotropium Bromide Monohydrate (SPIRIVA RESPIMAT) 1.25 MCG/ACT AERS Inhale 2 puffs into the lungs daily as needed (for shortness of breath).   Yes [provider]  zaleplon (SONATA) 5 MG capsule 1-2 caps for sleep at night as needed 06/15/17   Deneise Lever, MD   Past Medical History:  Diagnosis  Date  . Allergy   . CHF (congestive heart failure) ([place])   . Diabetes mellitus   . Hypertension   . Kidney infection   . Ovarian cancer ([place])   . Sleep apnea    Past Surgical History:  Procedure Laterality Date  . ABDOMINAL HYSTERECTOMY    . CESAREAN SECTION    . EYE SURGERY    . RIGHT/LEFT HEART CATH AND CORONARY ANGIOGRAPHY N/A 05/26/2017   Procedure: Right/Left Heart Cath and Coronary Angiography;  Surgeon: Jolaine Artist, MD;  Location: [facility];  Service: Cardiovascular;  Laterality: N/A;  . SHOULDER OPEN ROTATOR CUFF REPAIR  1988   Family History  Problem Relation Age of Onset  . Cancer Mother   . Diabetes Father   . Bipolar disorder Brother   . Dementia Brother   . Diabetes Brother    Social History   Social History  . Marital status: Married    Spouse name: N/A  . Number of children: 2  . Years of education: N/A   Occupational History  . unemployed    Social History Main Topics  . Smoking status: Never Smoker  . Smokeless tobacco: Never Used  . Alcohol use Yes     Comment: occ  . Drug use: No  . Sexual activity: Not on file   Other Topics Concern  . Not on file   Social History Narrative  . No narrative on file   ROS-see HPI  + = pos Constitutional:    + weight loss, night sweats, fevers, chills, fatigue, lassitude. HEENT:    headaches, difficulty swallowing, tooth/dental problems, sore throat,       sneezing, itching, ear ache, nasal congestion, post nasal drip, snoring CV:   + chest pain, orthopnea, PND, swelling in lower extremities, anasarca,                                                       dizziness, + palpitations Resp:   + shortness of breath with exertion or at rest.                productive cough,   non-productive cough, coughing up of blood.              change in color of mucus.  wheezing.   Skin:    rash or lesions. GI:  + heartburn, indigestion, abdominal pain, nausea, vomiting, diarrhea,                 change in  bowel habits, loss of appetite GU: dysuria, change in color of urine, no urgency or frequency.   flank pain. MS:   joint pain, stiffness, decreased range of motion, back pain. Neuro-     nothing unusual Psych:  change in mood or affect.  + depression or anxiety.   memory loss.  OBJ- Physical Exam General- Alert,  Oriented, Affect-appropriate, Distress- none acute Skin- rash-none, lesions- none, excoriation- none Lymphadenopathy- none Head- atraumatic            Eyes- Gross vision intact, PERRLA, conjunctivae and secretions clear            Ears- Hearing, canals-normal            Nose- Clear, no-Septal dev, mucus, polyps, erosion, perforation             Throat- Mallampati II-III , mucosa clear , drainage- none, tonsils- atrophic Neck- flexible , trachea midline, no stridor , thyroid nl, carotid no bruit Chest - symmetrical excursion , unlabored           Heart/CV- RRR , no murmur , no gallop  , no rub, nl s1 s2                           - JVD- none , edema- none, stasis changes- none, varices- none           Lung- clear to P&A, wheeze- none, cough- none , dullness-none, rub- none           Chest wall-  Abd-  Br/ Gen/ Rectal- Not done, not indicated Extrem- cyanosis- none, clubbing, none, atrophy- none, strength- nl Neuro- grossly intact to observation

## 2017-06-15 NOTE — Patient Instructions (Signed)
Script printed for Sonata/ zaleplon 5 mg   Ok to take if needed- at bedtime and/ or later at night, as long as you expect to have at least 3-4 more hours of sleep, so you don't wake up too groggy.  Watch for clues to sleep apnea- especially snoring, witnessed apneas/ breath-holding, so we can discuss at next visit.

## 2017-06-16 ENCOUNTER — Telehealth (HOSPITAL_COMMUNITY): Payer: Self-pay

## 2017-06-16 NOTE — Telephone Encounter (Signed)
I called and left message on patient voicemail to call office about scheduling for cardiac rehab. I left office contact information on voicemail to return call.

## 2017-06-17 DIAGNOSIS — G4733 Obstructive sleep apnea (adult) (pediatric): Secondary | ICD-10-CM | POA: Insufficient documentation

## 2017-06-17 DIAGNOSIS — G4700 Insomnia, unspecified: Secondary | ICD-10-CM | POA: Insufficient documentation

## 2017-06-17 NOTE — Assessment & Plan Note (Signed)
She indicates insomnia and secondary daytime sleepiness have dramatically improved since congestive heart failure was diagnosed and treated. There is still a component of delayed sleep onset and waking after sleep onset when she will get up and watch TV. Plan-we discussed good sleep hygiene and agreed to provide Sonata 5 mg (short half-life) for occasional use if needed. She is going to watch for a while to see how her sleep patterns respond to control of her heart failure.

## 2017-06-17 NOTE — Assessment & Plan Note (Addendum)
She reports a diagnosis of mild obstructive sleep apnea made at the [facility] sleep lab for 5 years ago. She chose to try an oral appliance but didn't like it. We are requesting the record of that study. It is not clear that this is an ongoing problem after weight loss with diuresis. Before we order sleep study we are going to have her and her husband watch her sleep patterns. Recognize that obstructive sleep apnea would be a significant issue given her cardiac history.

## 2017-06-22 ENCOUNTER — Telehealth (HOSPITAL_COMMUNITY): Payer: Self-pay

## 2017-06-22 ENCOUNTER — Encounter (HOSPITAL_COMMUNITY): Payer: Self-pay

## 2017-06-22 NOTE — Telephone Encounter (Signed)
I called and left message on patient voicemail to call office about scheduling for cardiac rehab. I left office contact information on voicemail to return call.  I have mailed patient a letter with information about cardiac rehab.

## 2017-06-26 ENCOUNTER — Ambulatory Visit (HOSPITAL_COMMUNITY)
Admission: RE | Admit: 2017-06-26 | Discharge: 2017-06-26 | Disposition: A | Discharge status: Home / Self Care | Payer: Managed Care, Other (non HMO) | Source: Ambulatory Visit | Attending: Internal Medicine | Admitting: Internal Medicine

## 2017-06-26 VITALS — BP 98/58 | HR 93 | Wt 189.4 lb

## 2017-06-26 DIAGNOSIS — I10 Essential (primary) hypertension: Secondary | ICD-10-CM | POA: Diagnosis present

## 2017-06-26 DIAGNOSIS — E669 Obesity, unspecified: Secondary | ICD-10-CM | POA: Diagnosis not present

## 2017-06-26 DIAGNOSIS — F909 Attention-deficit hyperactivity disorder, unspecified type: Secondary | ICD-10-CM | POA: Diagnosis not present

## 2017-06-26 DIAGNOSIS — R000 Tachycardia, unspecified: Secondary | ICD-10-CM | POA: Insufficient documentation

## 2017-06-26 DIAGNOSIS — I5022 Chronic systolic (congestive) heart failure: Secondary | ICD-10-CM | POA: Diagnosis not present

## 2017-06-26 DIAGNOSIS — E119 Type 2 diabetes mellitus without complications: Secondary | ICD-10-CM | POA: Diagnosis not present

## 2017-06-26 DIAGNOSIS — Z79899 Other long term (current) drug therapy: Secondary | ICD-10-CM | POA: Diagnosis not present

## 2017-06-26 DIAGNOSIS — Z6830 Body mass index (BMI) 30.0-30.9, adult: Secondary | ICD-10-CM | POA: Insufficient documentation

## 2017-06-26 DIAGNOSIS — I110 Hypertensive heart disease with heart failure: Secondary | ICD-10-CM | POA: Insufficient documentation

## 2017-06-26 LAB — BASIC METABOLIC PANEL
ANION GAP: 7 (ref 5–15)
BUN: 16 mg/dL (ref 6–20)
CHLORIDE: 105 mmol/L (ref 101–111)
CO2: 26 mmol/L (ref 22–32)
Calcium: 9.4 mg/dL (ref 8.9–10.3)
Creatinine, Ser: 0.89 mg/dL (ref 0.44–1.00)
GFR calc non Af Amer: >60 mL/min (ref >60)
Glucose, Bld: 176 mg/dL — ABNORMAL HIGH (ref 65–99)
POTASSIUM: 4.6 mmol/L (ref 3.5–5.1)
Sodium: 138 mmol/L (ref 135–145)

## 2017-06-26 MED ORDER — CARVEDILOL 3.125 MG PO TABS: 3.1250 mg | ORAL_TABLET | Freq: Two times a day (BID) | ORAL | 5 refills | Status: DC

## 2017-06-26 NOTE — Patient Instructions (Signed)
It was great to see you today!  Please START carvedilol 3.125 mg TWICE DAILY.   Labs today. We will call you with any abnormalities.   Please keep your appointment with Dr. Haroldine Laws on 9/19.

## 2017-06-26 NOTE — Progress Notes (Signed)
HF MD: Renee Howell  HPI:  Renee Howell a 51 y.o. Caucasianfemale with a h/o obesity, HTN. ADD and poorly-controlled DM2, chronic systolic CHF (EF 41-63%).   Diagnosed in June 2018 with NICM, systolic CHF. She presented to Dr. Macky Howell office (for an evaluation of tachycardia)and reported having significant issues with her breathingover the past 6 months. Had recently had PNA a few months prior. She was found to have an EF of 15-20%. Was markedly overloaded in his office and therefore hospital admission was reccommeded.   Admitted 05/22/17-05/29/17. Required milrinone for low output HF. Diuresed over 20 pounds with milrinone and IV Lasix. RHC showed Fick output/index 4.4/2.2 on 0.125 mcg Milrinone. Cardiac MRI without infiltrative disease. Milrinone weaned off. It was felt that her cardiomyopathy was related to either undiagnosed sleep apnea, viral or tachy induced (she has been on stimulants for ADD for a long time). Started on corlanor for tachycardia, although tachycardia likely represented the low output HF as well. Her stimulants were stopped at discharge. Discharge weight was 197 pounds.     She returns today for pharmacist-led HF medication titration. At last HF clinic visit on 7/13, no medication changes were made but she was asked to limit her K intake 2/2 hyperkalemia (5.1). States she has been feeling well since last visit. Has been walking nightly about half a mile without DOE. Mild SOB when it gets hot/humid outside.  No exertional chest pain. She states that she saw a "sleep doctor" recently that told her that she was not a candidate for a sleep study so he instead prescribed Zaleplon, a sleep aid.      Marland Kitchen Shortness of breath/dyspnea on exertion? no  . Orthopnea/PND? no . Edema? no . Lightheadedness/dizziness? Yes - only when getting up too quickly  . Daily weights at home? Yes - 189-191.5 lb . Blood pressure/heart rate monitoring at home? no . Following  low-sodium/fluid-restricted diet? yes  HF Medications: Digoxin 0.125 mg PO daily Furosemide 20 mg PO daily PRN for weight gain 3 lb overnight 5 lb in week /SOB -- 2 tabs since discharge  Corlanor 5 mg PO BID Entresto 24-26 mg PO BID Spironolactone 25 mg PO daily   Has the patient been experiencing any side effects to the medications prescribed?  no  Does the patient have any problems obtaining medications due to transportation or finances?   no  Understanding of regimen: good Understanding of indications: good Potential of compliance: good Patient understands to avoid NSAIDs. Patient understands to avoid decongestants.    Pertinent Lab Values: . 06/26/17: Serum creatinine 0.89, BUN 16, Potassium 4.6 (7/13 - 5.1), Sodium 138 . 06/02/17: Digoxin 0.4   Vital Signs: . Weight: 189.4 lb (dry weight: 188-190 lb) . Blood pressure: 98/58 mmHg  . Heart rate: 93 bpm    Assessment: 1. 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 NYHA class IIsymptoms.  - Volume status stable  - Will add low dose carvedilol 3.125 mg BID with no further s/s low output  - Continue furosemide 20 mg PRN for weight gain, digoxin 0.125 mg daily, Corlanor 5 mg BID, Entresto 24-26 mg BID and spironolactone 25 mg daily - Basic disease state pathophysiology, medication indication, mechanism and side effects reviewed at length with patient and he verbalized understanding 2. DMII - Blood sugars well controlled based on home measurements. Following with CP.  3. ADHD - No longer on stimulants. No change.  5. Probable OSA - Sleep study ordered but "  sleep MD" told her she was not a candidate so has not been scheduled - Will have HF MD address at next visit 6. Sinus tach - Adding carvedilol today and will continue on corlanor 5 mg BID  Plan: 1) Medication changes: Based on clinical presentation, vital signs and recent labs will start carvedilol 3.125 mg BID 2) Labs: BMET today 3)  Follow-up: Dr. [name] on 08/16/17   Renee Howell, PharmD, BCPS, CPP Clinical Pharmacist Pager: [phone] Phone: [phone] 06/26/2017 12:56 PM  Agree with low-dose carvedilol.   Renee Bickers, MD  10:02 PM

## 2017-06-28 ENCOUNTER — Telehealth (HOSPITAL_COMMUNITY): Payer: Self-pay

## 2017-06-28 NOTE — Telephone Encounter (Signed)
Patient has been called 2X and letter sent about scheduling for cardiac rehab. Patient has not responded to phone calls and letter sent. Referral closed.

## 2017-06-30 ENCOUNTER — Institutional Professional Consult (permissible substitution): Payer: Managed Care, Other (non HMO) | Admitting: Internal Medicine

## 2017-07-21 ENCOUNTER — Institutional Professional Consult (permissible substitution): Payer: Managed Care, Other (non HMO) | Admitting: Internal Medicine

## 2017-08-16 ENCOUNTER — Encounter (HOSPITAL_COMMUNITY): Payer: Self-pay | Admitting: Internal Medicine

## 2017-08-16 ENCOUNTER — Ambulatory Visit (HOSPITAL_COMMUNITY)
Admission: RE | Admit: 2017-08-16 | Discharge: 2017-08-16 | Disposition: A | Discharge status: Home / Self Care | Payer: Managed Care, Other (non HMO) | Source: Ambulatory Visit | Attending: Internal Medicine | Admitting: Internal Medicine

## 2017-08-16 VITALS — BP 100/58 | HR 78 | Wt 194.0 lb

## 2017-08-16 DIAGNOSIS — I5022 Chronic systolic (congestive) heart failure: Secondary | ICD-10-CM | POA: Insufficient documentation

## 2017-08-16 DIAGNOSIS — I429 Cardiomyopathy, unspecified: Secondary | ICD-10-CM | POA: Diagnosis not present

## 2017-08-16 DIAGNOSIS — E119 Type 2 diabetes mellitus without complications: Secondary | ICD-10-CM

## 2017-08-16 DIAGNOSIS — G4730 Sleep apnea, unspecified: Secondary | ICD-10-CM | POA: Insufficient documentation

## 2017-08-16 DIAGNOSIS — I110 Hypertensive heart disease with heart failure: Secondary | ICD-10-CM | POA: Diagnosis present

## 2017-08-16 DIAGNOSIS — Z8543 Personal history of malignant neoplasm of ovary: Secondary | ICD-10-CM | POA: Diagnosis not present

## 2017-08-16 DIAGNOSIS — E669 Obesity, unspecified: Secondary | ICD-10-CM | POA: Diagnosis not present

## 2017-08-16 DIAGNOSIS — Z794 Long term (current) use of insulin: Secondary | ICD-10-CM | POA: Diagnosis not present

## 2017-08-16 DIAGNOSIS — E1165 Type 2 diabetes mellitus with hyperglycemia: Secondary | ICD-10-CM | POA: Diagnosis not present

## 2017-08-16 LAB — BASIC METABOLIC PANEL
Anion gap: 11 (ref 5–15)
BUN: 17 mg/dL (ref 6–20)
CHLORIDE: 104 mmol/L (ref 101–111)
CO2: 24 mmol/L (ref 22–32)
CREATININE: 0.83 mg/dL (ref 0.44–1.00)
Calcium: 9.5 mg/dL (ref 8.9–10.3)
GFR calc Af Amer: >60 mL/min (ref >60)
GFR calc non Af Amer: >60 mL/min (ref >60)
Glucose, Bld: 155 mg/dL — ABNORMAL HIGH (ref 65–99)
POTASSIUM: 4.7 mmol/L (ref 3.5–5.1)
Sodium: 139 mmol/L (ref 135–145)

## 2017-08-16 MED ORDER — SACUBITRIL-VALSARTAN 49-51 MG PO TABS: 1.0000 | ORAL_TABLET | Freq: Two times a day (BID) | ORAL | 6 refills | Status: DC

## 2017-08-16 NOTE — Patient Instructions (Addendum)
Routine lab work today. Will notify you of abnormal results, otherwise no news is good news!  INCREASE Entresto to 49/51 mg twice daily. Can double up on current 24/26 mg tablet (Take 2 tabs twice daily). New Rx ha been sent to your pharmacy for 49/51 mg tablets (Take 1 tab twice daily).  Call CHF clinic if you become dizzy after increasing Entresto.  Follow up 2 months with Dr. Haroldine Laws.  _______________________________________________________________  _______________________________________________________________  Take all medication as prescribed the day of your appointment. Bring all medications with you to your appointment.  Do the following things EVERYDAY: 1) Weigh yourself in the morning before breakfast. Write it down and keep it in a log. 2) Take your medicines as prescribed 3) Eat low salt foods-Limit salt (sodium) to 2000 mg per day.  4) Stay as active as you can everyday 5) Limit all fluids for the day to less than 2 liters

## 2017-08-16 NOTE — Progress Notes (Signed)
Advanced Heart Failure Clinic Note   Primary Cardiologist: Dr. Haroldine Laws   HPI: Renee Howell a 51 y.o.female with a h/o obesity, HTN. ADD and poorly-controlled DM2, chronic systolic CHF (EF 41-74%).    Diagnosed in June 2018 with NICM, systolic CHF. She presented to Dr. Macky Lower office (for an evaluation of tachycardia)and reported having significant issues with her breathing over the past 6 months. Had recently had PNA a few months prior. She was found to have an EF of 15-20%. Was markedly overloaded in his office and therefore hospital admission was reccommeded.   Admitted 05/22/17-05/29/17. Required milrinone for low output HF. Diuresed over 20 pounds with milrinone and IV Lasix. RHC showed Fick output/index 4.4/2.2 on 0.125 mcg Milrinone. Cardiac MRI without infiltrative disease. Milrinone weaned off. It was felt that her cardiomyopathy was related to either undiagnosed sleep apnea, viral or tachy induced (she has been on stimulants for ADD for a long time). Started on corlanor for tachycardia, although tachycardia likely represented the low output HF as well. Her stimulants were stopped at discharge. Discharge weight was 197 pounds.    She returns today for follow up. Seen in [facility] about 1 month ago. Doing well. Started low-dose carvedilol. Tolerating well. Now taking lasix only as needed. Says she is improving slowly but still gives out particularly if it is hot. Still struggles with going to grocery store. Very stressed out over her brother who has mental illness and has to be moved back in with her. Weight fluctuates between 192-196. Takes lasix if weight is on higher end. Dr. Marin Comment managing DM2. Last HGbA1c was 9.   Bedside echo done personally in clinic today EF 25-30% RV ok.    Past Medical History:  Diagnosis Date  . Allergy   . CHF (congestive heart failure) ([place])   . Diabetes mellitus   . Hypertension   . Kidney infection   . Ovarian cancer ([place])   . Sleep  apnea     Current Outpatient Prescriptions  Medication Sig Dispense Refill  . atorvastatin (LIPITOR) 10 MG tablet Take 10 mg by mouth daily.    . busPIRone (BUSPAR) 7.5 MG tablet Take 7.5 mg by mouth 2 (two) times daily.    . carvedilol (COREG) 3.125 MG tablet Take 1 tablet (3.125 mg total) by mouth 2 (two) times daily. 60 tablet 5  . cholecalciferol (VITAMIN D) 1000 units tablet Take 2,000 Units by mouth daily.    . digoxin (LANOXIN) 0.125 MG tablet Take 1 tablet (0.125 mg total) by mouth daily. 30 tablet 6  . DULoxetine (CYMBALTA) 30 MG capsule Take 90 mg by mouth daily.    . fluticasone (FLONASE) 50 MCG/ACT nasal spray Place 2 sprays into both nostrils daily.    . furosemide (LASIX) 20 MG tablet Take 20 mg by mouth daily as needed. Weight gain >3 lb in 1 day or >5 lb in 1 week    . glyBURIDE (DIABETA) 5 MG tablet Take 5 mg by mouth 2 (two) times daily.     . insulin detemir (LEVEMIR) 100 UNIT/ML injection Inject 30 Units into the skin 2 (two) times daily.     . insulin lispro (HUMALOG) 100 UNIT/ML injection Inject 2-8 Units into the skin 3 (three) times daily as needed for high blood sugar. Per sliding scale    . ivabradine (CORLANOR) 5 MG TABS tablet Take 1 tablet (5 mg total) by mouth 2 (two) times daily with a meal. 60 tablet 6  . metFORMIN (GLUCOPHAGE) 1000  MG tablet Take 1,000 mg by mouth 2 (two) times daily.    . montelukast (SINGULAIR) 10 MG tablet Take 10 mg by mouth daily.  0  . ranitidine (ZANTAC) 150 MG tablet Take 150 mg by mouth daily.    . sacubitril-valsartan (ENTRESTO) 24-26 MG Take 1 tablet by mouth 2 (two) times daily. 60 tablet 6  . spironolactone (ALDACTONE) 25 MG tablet Take 1 tablet (25 mg total) by mouth daily. 30 tablet 6  . zaleplon (SONATA) 5 MG capsule 1-2 caps for sleep at night as needed 30 capsule 2   No current facility-administered medications for this encounter.     Allergies  Allergen Reactions  . Trulicity [Dulaglutide] Other (See Comments)    Pt  states that the trulicity makes her feel horrible three days after she uses it. States she will never take it again.   . Latex Rash      Social History   Social History  . Marital status: Married    Spouse name: N/A  . Number of children: 2  . Years of education: N/A   Occupational History  . unemployed    Social History Main Topics  . Smoking status: Never Smoker  . Smokeless tobacco: Never Used  . Alcohol use Yes     Comment: occ  . Drug use: No  . Sexual activity: Not on file   Other Topics Concern  . Not on file   Social History Narrative  . No narrative on file      Family History  Problem Relation Age of Onset  . Cancer Mother   . Diabetes Father   . Bipolar disorder Brother   . Dementia Brother   . Diabetes Brother     Vitals:   08/16/17 1007  BP: (!) 100/58  Pulse: 78  SpO2: 100%  Weight: 194 lb (88 kg)   Wt Readings from Last 3 Encounters:  08/16/17 194 lb (88 kg)  06/26/17 189 lb 6.4 oz (85.9 kg)  06/15/17 191 lb (86.6 kg)    PHYSICAL EXAM: General:  Well appearing. No resp difficulty HEENT: normal Neck: supple. no JVD. Carotids 2+ bilat; no bruits. No lymphadenopathy or thryomegaly appreciated. Cor: PMI nondisplaced. Regular rate & rhythm. No rubs, gallops or murmurs. Lungs: clear Abdomen: soft, nontender, nondistended. No hepatosplenomegaly. No bruits or masses. Good bowel sounds. Extremities: no cyanosis, clubbing, rash, edema Neuro: alert & orientedx3, cranial nerves grossly intact. moves all 4 extremities w/o difficulty. Affect pleasant   ASSESSMENT & PLAN: 1. Chronic systolic CHF: Echo EF 69% with biventricular dysfunction. 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 DM. Normal cors by cath in 04/2017. Cardiac MRI without infiltrative disease.  - Bedside echo done personally in clinic today EF 25-30% RV ok.   - Overall improved but still NYHA III. Volume status looks good but does need lasix intermittently. HR improving. - Continue  lasix 20 mg AS NEEDED only. BMET today.  - Continue Spiro 25 mg daily.  - Continue digoxin 0.125 mg daily.  - Will increase Entresto 49/51 mg BID as tolerated  (cwn cut back if BP low) - Continue carvedilol 3.125 bid  - Continue ivabradine 5 bid  2. DM - Blood sugars remain high. Asked her to talk with Dr. Marin Comment about switching glyburide to [facility]. .    3. ADD - No longer on stimulants. No change.   5. Probable OSA -  Follows with Dr. Maxwell Caul. 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   6. Sinus tach -  Improved on corlanor 5 mg BID and carvedilol.   Total time spent 45 minutes. Over half that time spent discussing above and performing bedside echo personally.Glori Bickers, MD 08/16/17

## 2017-08-22 ENCOUNTER — Other Ambulatory Visit (HOSPITAL_COMMUNITY): Payer: Self-pay | Admitting: Cardiology

## 2017-08-22 MED ORDER — SPIRONOLACTONE 25 MG PO TABS: 25.0000 mg | ORAL_TABLET | Freq: Every day | ORAL | 3 refills | Status: DC

## 2017-08-22 MED ORDER — DIGOXIN 125 MCG PO TABS
0.1250 mg | ORAL_TABLET | Freq: Every day | ORAL | 3 refills | Status: DC
Start: 2017-08-22 — End: 2017-12-20

## 2017-08-22 MED ORDER — CARVEDILOL 3.125 MG PO TABS: 3.1250 mg | ORAL_TABLET | Freq: Two times a day (BID) | ORAL | 3 refills | Status: DC

## 2017-08-24 ENCOUNTER — Other Ambulatory Visit (HOSPITAL_COMMUNITY): Payer: Self-pay | Admitting: Cardiology

## 2017-08-24 MED ORDER — IVABRADINE HCL 5 MG PO TABS: 5.0000 mg | ORAL_TABLET | Freq: Two times a day (BID) | ORAL | 3 refills | Status: DC

## 2017-08-26 ENCOUNTER — Encounter (HOSPITAL_COMMUNITY): Payer: Self-pay | Admitting: Internal Medicine

## 2017-08-28 MED ORDER — IVABRADINE HCL 5 MG PO TABS
5.0000 mg | ORAL_TABLET | Freq: Two times a day (BID) | ORAL | 3 refills | Status: DC
Start: 2017-08-28 — End: 2018-08-06

## 2017-08-28 NOTE — Addendum Note (Signed)
Addended by: Kerry Dory on: 08/28/2017 08:59 AM   Modules accepted: Orders

## 2017-09-18 ENCOUNTER — Ambulatory Visit: Payer: Managed Care, Other (non HMO) | Admitting: Internal Medicine

## 2017-09-18 ENCOUNTER — Other Ambulatory Visit (HOSPITAL_COMMUNITY): Payer: Self-pay | Admitting: *Deleted

## 2017-09-18 MED ORDER — SACUBITRIL-VALSARTAN 49-51 MG PO TABS: 1.0000 | ORAL_TABLET | Freq: Two times a day (BID) | ORAL | 4 refills | Status: DC

## 2017-10-18 ENCOUNTER — Other Ambulatory Visit: Payer: Self-pay

## 2017-10-18 ENCOUNTER — Ambulatory Visit (HOSPITAL_COMMUNITY)
Admission: RE | Admit: 2017-10-18 | Discharge: 2017-10-18 | Disposition: A | Discharge status: Home / Self Care | Payer: Managed Care, Other (non HMO) | Source: Ambulatory Visit | Attending: Internal Medicine | Admitting: Internal Medicine

## 2017-10-18 VITALS — BP 114/72 | HR 96 | Wt 195.4 lb

## 2017-10-18 DIAGNOSIS — I110 Hypertensive heart disease with heart failure: Secondary | ICD-10-CM | POA: Diagnosis present

## 2017-10-18 DIAGNOSIS — Z833 Family history of diabetes mellitus: Secondary | ICD-10-CM | POA: Insufficient documentation

## 2017-10-18 DIAGNOSIS — E669 Obesity, unspecified: Secondary | ICD-10-CM | POA: Diagnosis not present

## 2017-10-18 DIAGNOSIS — Z8543 Personal history of malignant neoplasm of ovary: Secondary | ICD-10-CM | POA: Diagnosis not present

## 2017-10-18 DIAGNOSIS — I5022 Chronic systolic (congestive) heart failure: Secondary | ICD-10-CM | POA: Insufficient documentation

## 2017-10-18 DIAGNOSIS — Z818 Family history of other mental and behavioral disorders: Secondary | ICD-10-CM | POA: Insufficient documentation

## 2017-10-18 DIAGNOSIS — Z888 Allergy status to other drugs, medicaments and biological substances status: Secondary | ICD-10-CM | POA: Insufficient documentation

## 2017-10-18 DIAGNOSIS — Z79899 Other long term (current) drug therapy: Secondary | ICD-10-CM | POA: Diagnosis not present

## 2017-10-18 DIAGNOSIS — I429 Cardiomyopathy, unspecified: Secondary | ICD-10-CM | POA: Insufficient documentation

## 2017-10-18 DIAGNOSIS — G4730 Sleep apnea, unspecified: Secondary | ICD-10-CM | POA: Diagnosis not present

## 2017-10-18 DIAGNOSIS — E119 Type 2 diabetes mellitus without complications: Secondary | ICD-10-CM | POA: Diagnosis not present

## 2017-10-18 DIAGNOSIS — Z809 Family history of malignant neoplasm, unspecified: Secondary | ICD-10-CM | POA: Insufficient documentation

## 2017-10-18 DIAGNOSIS — Z794 Long term (current) use of insulin: Secondary | ICD-10-CM | POA: Insufficient documentation

## 2017-10-18 DIAGNOSIS — R42 Dizziness and giddiness: Secondary | ICD-10-CM | POA: Insufficient documentation

## 2017-10-18 DIAGNOSIS — Z91048 Other nonmedicinal substance allergy status: Secondary | ICD-10-CM | POA: Insufficient documentation

## 2017-10-18 LAB — BASIC METABOLIC PANEL
Anion gap: 9 (ref 5–15)
BUN: 18 mg/dL (ref 6–20)
CO2: 21 mmol/L — AB (ref 22–32)
CREATININE: 0.95 mg/dL (ref 0.44–1.00)
Calcium: 9.4 mg/dL (ref 8.9–10.3)
Chloride: 104 mmol/L (ref 101–111)
GFR calc Af Amer: >60 mL/min (ref >60)
GFR calc non Af Amer: >60 mL/min (ref >60)
GLUCOSE: 295 mg/dL — AB (ref 65–99)
Potassium: 4.5 mmol/L (ref 3.5–5.1)
Sodium: 134 mmol/L — ABNORMAL LOW (ref 135–145)

## 2017-10-18 MED ORDER — CARVEDILOL 6.25 MG PO TABS: 6.2500 mg | ORAL_TABLET | Freq: Two times a day (BID) | ORAL | 3 refills | Status: DC

## 2017-10-18 NOTE — Patient Instructions (Signed)
Increase Carvedilol to 6.25 mg Twice daily   Labs today  Your physician recommends that you schedule a follow-up appointment in: 2 months with echocardiogram

## 2017-10-18 NOTE — Progress Notes (Signed)
Advanced Heart Failure Clinic Note   Primary Cardiologist: Dr. Haroldine Laws   HPI: Renee Howell a 51 y.o.female with a h/o obesity, HTN. ADD and poorly-controlled DM2, chronic systolic CHF (EF 25-36%).   Diagnosed in June 2018 with NICM, systolic CHF. She presented to Dr. Macky Lower office (for an evaluation of tachycardia)and reported having significant issues with her breathing over the past 6 months. Had recently had PNA a few months prior. She was found to have an EF of 15-20%. Was markedly overloaded in his office and therefore hospital admission was reccommeded.   Admitted 05/22/17-05/29/17. Required milrinone for low output HF. Diuresed over 20 pounds with milrinone and IV Lasix. RHC showed Fick output/index 4.4/2.2 on 0.125 mcg Milrinone. Cardiac MRI without infiltrative disease. Milrinone weaned off. It was felt that her cardiomyopathy was related to either undiagnosed sleep apnea, viral or tachy induced (she has been on stimulants for ADD for a long time). Started on corlanor for tachycardia, although tachycardia likely represented the low output HF as well. Her stimulants were stopped at discharge. Discharge weight was 197 pounds.    She returns today for follow up. In September we did bedside echo and EF 25-30%. Feeling better. Says he heart no longer hurts. Remains very tired and sleepy. Very thirsty after starting Jardiance. Trying to watch her intake though. No orthopnea, PND, edema. Takes her granddaughter out for a 20 min walk 3x/day. Gets dizzy if she gets up to fast.   Bedside echo 30-35% RV ok.Done personally   Past Medical History:  Diagnosis Date  . Allergy   . CHF (congestive heart failure) ([place])   . Diabetes mellitus   . Hypertension   . Kidney infection   . Ovarian cancer ([place])   . Sleep apnea     Current Outpatient Medications  Medication Sig Dispense Refill  . atorvastatin (LIPITOR) 10 MG tablet Take 10 mg by mouth daily.    . busPIRone (BUSPAR) 7.5 MG  tablet Take 7.5 mg by mouth 2 (two) times daily.    . carvedilol (COREG) 3.125 MG tablet Take 1 tablet (3.125 mg total) by mouth 2 (two) times daily. 180 tablet 3  . cholecalciferol (VITAMIN D) 1000 units tablet Take 2,000 Units by mouth daily.    . digoxin (LANOXIN) 0.125 MG tablet Take 1 tablet (0.125 mg total) by mouth daily. 90 tablet 3  . DULoxetine (CYMBALTA) 30 MG capsule Take 90 mg by mouth daily.    . empagliflozin (JARDIANCE) 10 MG TABS tablet Take 10 mg by mouth daily.    . fluticasone (FLONASE) 50 MCG/ACT nasal spray Place 2 sprays into both nostrils daily.    . furosemide (LASIX) 20 MG tablet Take 20 mg by mouth daily as needed. Weight gain >3 lb in 1 day or >5 lb in 1 week    . insulin detemir (LEVEMIR) 100 UNIT/ML injection Inject 30 Units into the skin 2 (two) times daily.     . insulin lispro (HUMALOG) 100 UNIT/ML injection Inject 2-8 Units into the skin 3 (three) times daily as needed for high blood sugar. Per sliding scale    . ivabradine (CORLANOR) 5 MG TABS tablet Take 1 tablet (5 mg total) by mouth 2 (two) times daily with a meal. 180 tablet 3  . metFORMIN (GLUCOPHAGE) 1000 MG tablet Take 1,000 mg by mouth 2 (two) times daily.    . montelukast (SINGULAIR) 10 MG tablet Take 10 mg by mouth daily.  0  . ranitidine (ZANTAC) 150 MG tablet Take  150 mg by mouth daily.    . sacubitril-valsartan (ENTRESTO) 49-51 MG Take 1 tablet by mouth 2 (two) times daily. 180 tablet 4  . spironolactone (ALDACTONE) 25 MG tablet Take 1 tablet (25 mg total) by mouth daily. 90 tablet 3  . zaleplon (SONATA) 5 MG capsule 1-2 caps for sleep at night as needed 30 capsule 2   No current facility-administered medications for this encounter.     Allergies  Allergen Reactions  . Trulicity [Dulaglutide] Other (See Comments)    Pt states that the trulicity makes her feel horrible three days after she uses it. States she will never take it again.   . Latex Rash      Social History   Socioeconomic  History  . Marital status: Married    Spouse name: Not on file  . Number of children: 2  . Years of education: Not on file  . Highest education level: Not on file  Social Needs  . Financial resource strain: Not on file  . Food insecurity - worry: Not on file  . Food insecurity - inability: Not on file  . Transportation needs - medical: Not on file  . Transportation needs - non-medical: Not on file  Occupational History  . Occupation: unemployed  Tobacco Use  . Smoking status: Never Smoker  . Smokeless tobacco: Never Used  Substance and Sexual Activity  . Alcohol use: Yes    Comment: occ  . Drug use: No  . Sexual activity: Not on file  Other Topics Concern  . Not on file  Social History Narrative  . Not on file      Family History  Problem Relation Age of Onset  . Cancer Mother   . Diabetes Father   . Bipolar disorder Brother   . Dementia Brother   . Diabetes Brother     Vitals:   10/18/17 1224  BP: 114/72  Pulse: 96  SpO2: 97%  Weight: 195 lb 6.4 oz (88.6 kg)   Wt Readings from Last 3 Encounters:  10/18/17 195 lb 6.4 oz (88.6 kg)  08/16/17 194 lb (88 kg)  06/26/17 189 lb 6.4 oz (85.9 kg)    PHYSICAL EXAM: General:  Well appearing. No resp difficulty HEENT: normal Neck: supple. no JVD. Carotids 2+ bilat; no bruits. No lymphadenopathy or thryomegaly appreciated. Cor: PMI nondisplaced. Regular rate & rhythm. No rubs, gallops or murmurs. Lungs: clear Abdomen: obese soft, nontender, nondistended. No hepatosplenomegaly. No bruits or masses. Good bowel sounds. Extremities: no cyanosis, clubbing, rash, edema Neuro: alert & orientedx3, cranial nerves grossly intact. moves all 4 extremities w/o difficulty. Affect pleasant    ASSESSMENT & PLAN: 1. Chronic systolic CHF: Echo EF 62% with biventricular dysfunction. 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 DM. Normal cors by cath in 04/2017. Cardiac MRI without infiltrative disease.  - Bedside echo done personally in  clinic today EF 30-35% RV ok.   - Overall improved. NYHA II - Has not needed lasix. Volume status looks good on apiro, Entresto and Jardiance. - Continue Spiro 25 mg daily.  - Continue digoxin 0.125 mg daily.  - Continue Entresto 49/51 mg BID as tolerated  (cwn cut back if BP low) - Increased carvedilol 6.25 bid  - Continue ivabradine 5 bid - RTC in 2 months with echo  2. DM - Blood sugars improving - Dr. Marin Comment switched glyburide to [place]. .    3. ADD - No longer on stimulants. No change.   5. Probable OSA -  Follows with Dr. Maxwell Caul. 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   6. Sinus tach - Improved on corlanor 5 mg BID and carvedilol.  - Increase carvedilol today   Glori Bickers, MD 10/18/17

## 2017-12-04 ENCOUNTER — Encounter (HOSPITAL_COMMUNITY): Payer: Self-pay | Admitting: Internal Medicine

## 2017-12-05 MED ORDER — CARVEDILOL 6.25 MG PO TABS: 6.2500 mg | ORAL_TABLET | Freq: Two times a day (BID) | ORAL | 3 refills | Status: DC

## 2017-12-20 ENCOUNTER — Other Ambulatory Visit: Payer: Self-pay

## 2017-12-20 ENCOUNTER — Encounter (HOSPITAL_COMMUNITY): Payer: Self-pay | Admitting: Internal Medicine

## 2017-12-20 ENCOUNTER — Ambulatory Visit (HOSPITAL_BASED_OUTPATIENT_CLINIC_OR_DEPARTMENT_OTHER)
Admission: RE | Admit: 2017-12-20 | Discharge: 2017-12-20 | Disposition: A | Discharge status: Home / Self Care | Payer: Managed Care, Other (non HMO) | Source: Ambulatory Visit | Attending: Internal Medicine | Admitting: Internal Medicine

## 2017-12-20 ENCOUNTER — Ambulatory Visit (HOSPITAL_COMMUNITY)
Admission: RE | Admit: 2017-12-20 | Discharge: 2017-12-20 | Disposition: A | Discharge status: Home / Self Care | Payer: Managed Care, Other (non HMO) | Source: Ambulatory Visit | Attending: Family Medicine | Admitting: Family Medicine

## 2017-12-20 VITALS — BP 126/74 | HR 80 | Wt 196.8 lb

## 2017-12-20 DIAGNOSIS — I5022 Chronic systolic (congestive) heart failure: Secondary | ICD-10-CM | POA: Diagnosis not present

## 2017-12-20 LAB — BASIC METABOLIC PANEL
ANION GAP: 13 (ref 5–15)
BUN: 20 mg/dL (ref 6–20)
CHLORIDE: 103 mmol/L (ref 101–111)
CO2: 18 mmol/L — ABNORMAL LOW (ref 22–32)
Calcium: 8.8 mg/dL — ABNORMAL LOW (ref 8.9–10.3)
Creatinine, Ser: 1.05 mg/dL — ABNORMAL HIGH (ref 0.44–1.00)
GFR calc Af Amer: >60 mL/min (ref >60)
GFR, EST NON AFRICAN AMERICAN: 60 mL/min — AB (ref >60)
Glucose, Bld: 415 mg/dL — ABNORMAL HIGH (ref 65–99)
POTASSIUM: 4.7 mmol/L (ref 3.5–5.1)
Sodium: 134 mmol/L — ABNORMAL LOW (ref 135–145)

## 2017-12-20 MED ORDER — CARVEDILOL 6.25 MG PO TABS: 9.3750 mg | ORAL_TABLET | Freq: Two times a day (BID) | ORAL | 3 refills | Status: DC

## 2017-12-20 NOTE — Progress Notes (Signed)
Advanced Heart Failure Clinic Note   Primary Cardiologist: Dr. Haroldine Laws   HPI: Renee Howell a 52 y.o.female with a h/o obesity, HTN. ADD and poorly-controlled DM2, chronic systolic CHF (EF 44-01%).   Diagnosed in June 2018 with NICM, systolic CHF. She presented to Dr. Macky Lower office (for an evaluation of tachycardia)and reported having significant issues with her breathing over the past 6 months. Had recently had PNA a few months prior. She was found to have an EF of 15-20%. Was markedly overloaded in his office and therefore hospital admission was reccommeded.   Admitted 05/22/17-05/29/17. Required milrinone for low output HF. Diuresed over 20 pounds with milrinone and IV Lasix. RHC showed Fick output/index 4.4/2.2 on 0.125 mcg Milrinone. Cardiac MRI without infiltrative disease. Milrinone weaned off. It was felt that her cardiomyopathy was related to either undiagnosed sleep apnea, viral or tachy induced (she has been on stimulants for ADD for a long time). Started on corlanor for tachycardia, although tachycardia likely represented the low output HF as well. Her stimulants were stopped at discharge. Discharge weight was 197 pounds.    She returns today for follow up. Not exercising as much due to the cold. Still fatigued with walking up steps. No swelling. No orthopnea or PND. Blood sugars running high. Adjusting meds and insulin. Not taking lasix.   Bedside echo 11/18 30-35% RV ok.  Echo today EF 55% RV normal   Past Medical History:  Diagnosis Date  . Allergy   . CHF (congestive heart failure) ([place])   . Diabetes mellitus   . Hypertension   . Kidney infection   . Ovarian cancer ([place])   . Sleep apnea     Current Outpatient Medications  Medication Sig Dispense Refill  . atorvastatin (LIPITOR) 10 MG tablet Take 10 mg by mouth daily.    . busPIRone (BUSPAR) 7.5 MG tablet Take 7.5 mg by mouth 2 (two) times daily.    . carvedilol (COREG) 6.25 MG tablet Take 1 tablet  (6.25 mg total) by mouth 2 (two) times daily. 180 tablet 3  . cholecalciferol (VITAMIN D) 1000 units tablet Take 2,000 Units by mouth daily.    . digoxin (LANOXIN) 0.125 MG tablet Take 1 tablet (0.125 mg total) by mouth daily. 90 tablet 3  . DULoxetine (CYMBALTA) 30 MG capsule Take 90 mg by mouth daily.    . empagliflozin (JARDIANCE) 10 MG TABS tablet Take 25 mg by mouth daily.     . fluticasone (FLONASE) 50 MCG/ACT nasal spray Place 2 sprays into both nostrils daily.    . furosemide (LASIX) 20 MG tablet Take 20 mg by mouth daily as needed. Weight gain >3 lb in 1 day or >5 lb in 1 week    . insulin detemir (LEVEMIR) 100 UNIT/ML injection Inject 55 Units into the skin 2 (two) times daily.     . insulin lispro (HUMALOG) 100 UNIT/ML injection Inject 2-8 Units into the skin 3 (three) times daily as needed for high blood sugar. Per sliding scale    . ivabradine (CORLANOR) 5 MG TABS tablet Take 1 tablet (5 mg total) by mouth 2 (two) times daily with a meal. 180 tablet 3  . metFORMIN (GLUCOPHAGE) 1000 MG tablet Take 1,000 mg by mouth 2 (two) times daily.    . montelukast (SINGULAIR) 10 MG tablet Take 10 mg by mouth daily.  0  . ranitidine (ZANTAC) 150 MG tablet Take 150 mg by mouth daily.    . sacubitril-valsartan (ENTRESTO) 49-51 MG Take 1 tablet  by mouth 2 (two) times daily. 180 tablet 4  . spironolactone (ALDACTONE) 25 MG tablet Take 1 tablet (25 mg total) by mouth daily. 90 tablet 3  . zaleplon (SONATA) 5 MG capsule 1-2 caps for sleep at night as needed 30 capsule 2   No current facility-administered medications for this encounter.     Allergies  Allergen Reactions  . Trulicity [Dulaglutide] Other (See Comments)    Pt states that the trulicity makes her feel horrible three days after she uses it. States she will never take it again.   . Latex Rash      Social History   Socioeconomic History  . Marital status: Married    Spouse name: Not on file  . Number of children: 2  . Years of  education: Not on file  . Highest education level: Not on file  Social Needs  . Financial resource strain: Not on file  . Food insecurity - worry: Not on file  . Food insecurity - inability: Not on file  . Transportation needs - medical: Not on file  . Transportation needs - non-medical: Not on file  Occupational History  . Occupation: unemployed  Tobacco Use  . Smoking status: Never Smoker  . Smokeless tobacco: Never Used  Substance and Sexual Activity  . Alcohol use: Yes    Comment: occ  . Drug use: No  . Sexual activity: Not on file  Other Topics Concern  . Not on file  Social History Narrative  . Not on file      Family History  Problem Relation Age of Onset  . Cancer Mother   . Diabetes Father   . Bipolar disorder Brother   . Dementia Brother   . Diabetes Brother     Vitals:   12/20/17 1158  BP: 126/74  Pulse: 80  SpO2: 98%  Weight: 196 lb 12.8 oz (89.3 kg)   Wt Readings from Last 3 Encounters:  12/20/17 196 lb 12.8 oz (89.3 kg)  10/18/17 195 lb 6.4 oz (88.6 kg)  08/16/17 194 lb (88 kg)    PHYSICAL EXAM: General:  Well appearing. No resp difficulty HEENT: normal Neck: supple. no JVD. Carotids 2+ bilat; no bruits. No lymphadenopathy or thryomegaly appreciated. Cor: PMI nondisplaced. Regular rate & rhythm. No rubs, gallops or murmurs. Lungs: clear Abdomen: soft, nontender, nondistended. No hepatosplenomegaly. No bruits or masses. Good bowel sounds. Extremities: no cyanosis, clubbing, rash, edema Neuro: alert & orientedx3, cranial nerves grossly intact. moves all 4 extremities w/o difficulty. Affect pleasant    ASSESSMENT & PLAN: 1. Chronic systolic CHF: Echo EF 51% with biventricular dysfunction. 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 DM. Normal cors by cath in 04/2017. Cardiac MRI without infiltrative disease.  - Echo today EF 55%. RV normal. - Complete recovery with medical therapy   - Stable NYHA II - Has not needed lasix. Volume status looks  good on spiro, Entresto and Jardiance. - Continue Spiro 25 mg daily.  - Stop digoxin   - Continue Entresto 49/51 mg BID as tolerated  - Increase carvedilol to 9.375 bid. Hopefully can cut Corlanor back soon.  - Continue ivabradine 5 bid  2. DM - Blood sugars up again. PCP adjusting.  - Continue Jardiance.    3. ADD - No longer on stimulants. No change.   5. Probable OSA -  Follows with Dr. Maxwell Caul. 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   6. Sinus tach - Improved on corlanor 5 mg BID and carvedilol.  - Increase  carvedilol today. Possibly wean carvedilol down the road.    Glori Bickers, MD 12/20/17

## 2017-12-20 NOTE — Patient Instructions (Signed)
Stop Digoxin  Increase Carvedilol to 9.375 mg (1 & 1/2 tabs) Twice daily   Lab today  We will contact you in 6 months to schedule your next appointment.

## 2017-12-20 NOTE — Addendum Note (Signed)
Encounter addended by: Scarlette Calico, RN on: 12/20/2017 12:29 PM  Actions taken: Medication long-term status modified, Pharmacy for encounter modified, Diagnosis association updated, Order list changed, Sign clinical note

## 2017-12-20 NOTE — Progress Notes (Signed)
  Echocardiogram 2D Echocardiogram has been performed.  Johny Chess 12/20/2017, 11:48 AM

## 2017-12-20 NOTE — Addendum Note (Signed)
Encounter addended by: Scarlette Calico, RN on: 12/20/2017 12:49 PM  Actions taken: Pharmacy for encounter modified, Order list changed

## 2018-02-08 ENCOUNTER — Other Ambulatory Visit: Payer: Self-pay | Admitting: Family Medicine

## 2018-02-08 DIAGNOSIS — Z1231 Encounter for screening mammogram for malignant neoplasm of breast: Secondary | ICD-10-CM

## 2018-03-07 ENCOUNTER — Other Ambulatory Visit (HOSPITAL_COMMUNITY): Payer: Self-pay | Admitting: *Deleted

## 2018-03-07 MED ORDER — CARVEDILOL 6.25 MG PO TABS: 9.3750 mg | ORAL_TABLET | Freq: Two times a day (BID) | ORAL | 3 refills | Status: DC

## 2018-03-28 ENCOUNTER — Ambulatory Visit
Admission: RE | Admit: 2018-03-28 | Discharge: 2018-03-28 | Disposition: A | Discharge status: Home / Self Care | Payer: Managed Care, Other (non HMO) | Source: Ambulatory Visit | Attending: Family Medicine | Admitting: Family Medicine

## 2018-03-28 DIAGNOSIS — Z1231 Encounter for screening mammogram for malignant neoplasm of breast: Secondary | ICD-10-CM

## 2018-04-12 IMAGING — MR MR CARD MORPHOLOGY WO/W CM
8 of 9 series · 39 of 40 positions shown · IV contrast (multihance)
Comparison: none

CLINICAL DATA: Cardiomyopathy of uncertain etiology.

EXAM:
CARDIAC MRI
TECHNIQUE: The patient was scanned on a 1.5 Tesla GE magnet. A dedicated
cardiac coil was used. Functional imaging was done using Fiesta
sequences. [DATE], and 4 chamber views were done to assess for RWMA's.
Modified [place]cean rule using a short axis stack was used to
calculate an ejection fraction on a dedicated work station using
Circle software. The patient received 30 cc of Multihance. After 10
minutes inversion recovery sequences were used to assess for
infiltration and scar tissue.
CONTRAST:  30 cc Multihance

[Series 3: bSSFP · sagittal · 8.0mm · 1.41mm/px · 2 of 19 slices shown (1 of 5)]
[im 1/19]
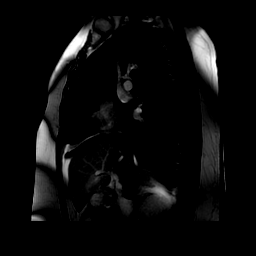
[im 19/19]
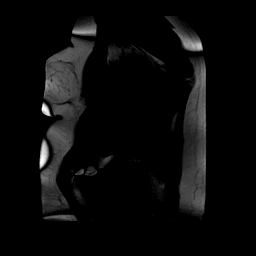

[Series 4: bSSFP · oblique · 8.0mm · 1.37mm/px · 1 of 20 slices shown (2 of 5)]
[im 1/20]
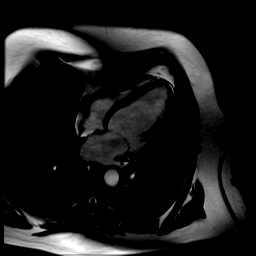

[Series 5: bSSFP · oblique · 8.0mm · 1.37mm/px · 18 of 320 slices shown (3 of 5)]
[im 1/320]
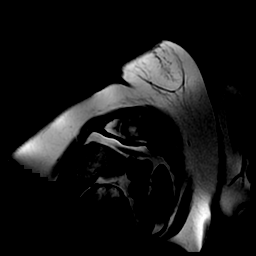
[im 19/320]
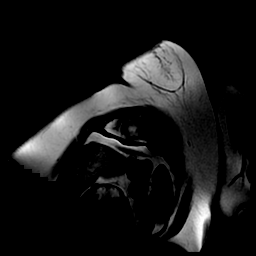
[im 38/320]
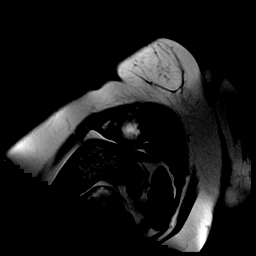
[im 57/320]
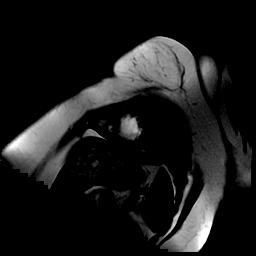
[im 76/320]
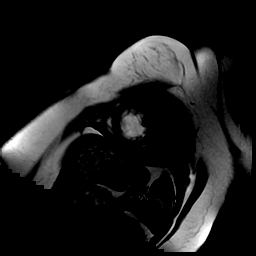
[im 94/320]
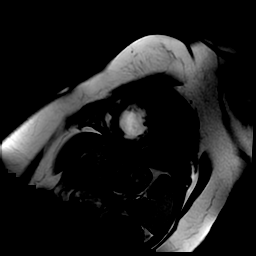
[im 113/320]
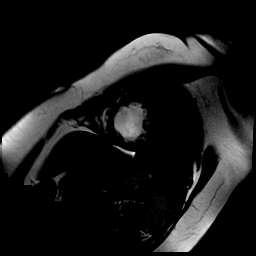
[im 132/320]
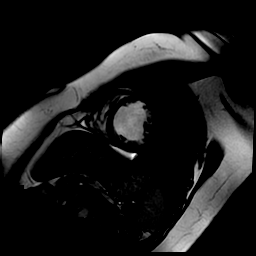
[im 151/320]
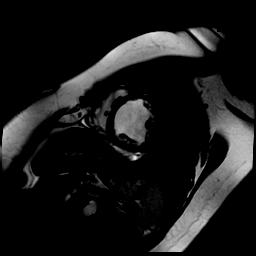
[im 169/320]
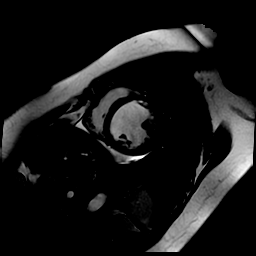
[im 188/320]
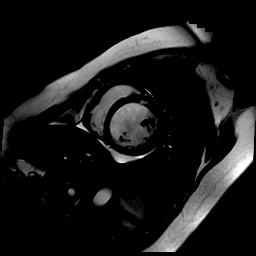
[im 207/320]
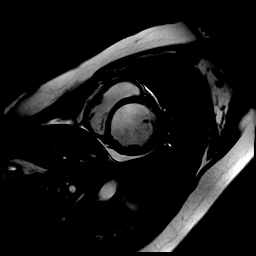
[im 226/320]
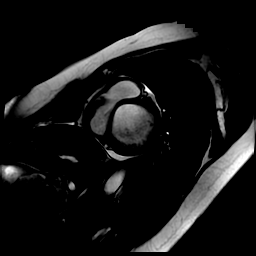
[im 244/320]
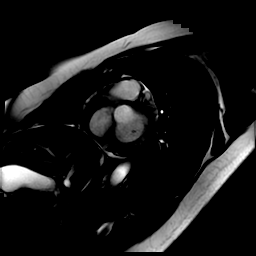
[im 263/320]
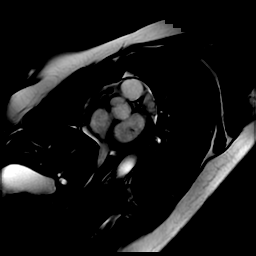
[im 282/320]
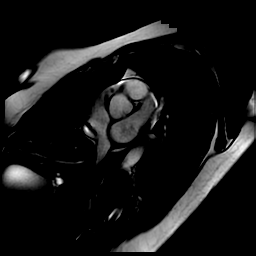
[im 301/320]
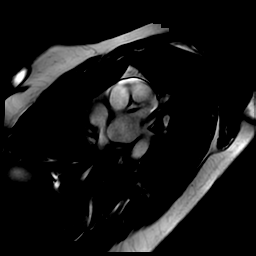
[im 320/320]
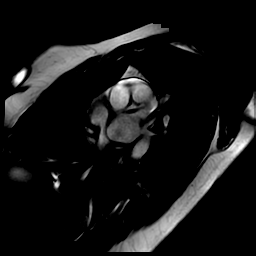

[Series 7: bSSFP · axial · 6.0mm · 1.25mm/px · z∈[-33,+9]mm · 10 of 180 slices shown (4 of 5)]
[im 1/180]
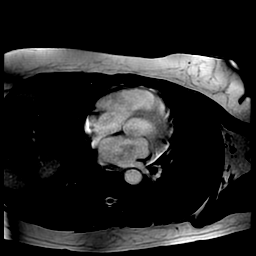
[im 20/180]
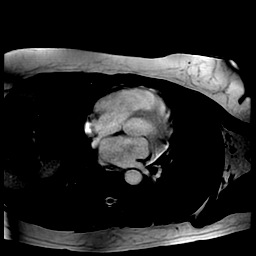
[im 40/180]
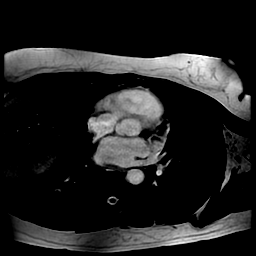
[im 60/180]
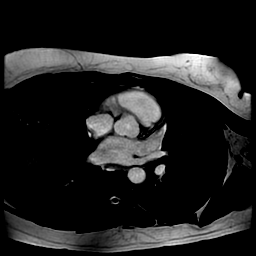
[im 80/180]
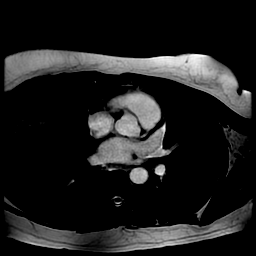
[im 100/180]
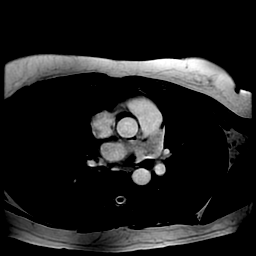
[im 120/180]
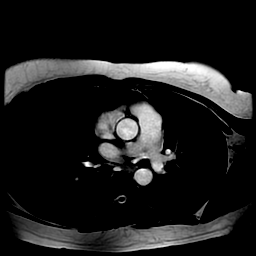
[im 140/180]
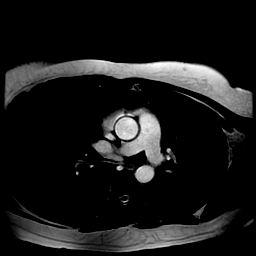
[im 160/180]
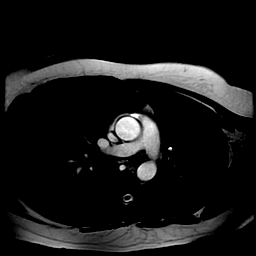
[im 180/180]
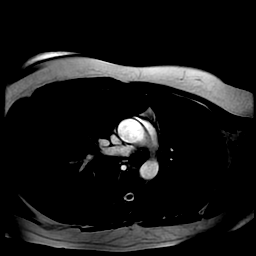

[Series 8: qpqs (id) · axial · 8.0mm · 1.48mm/px · z∈[+112,+112]mm · 3 of 60 slices shown]
[im 1/60]
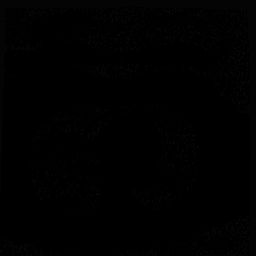
[im 30/60]
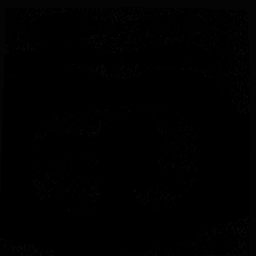
[im 60/60]
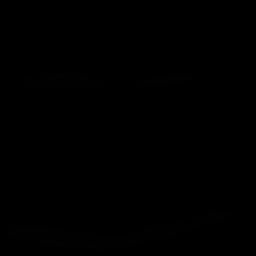

[Series 9: bSSFP · axial · 8.0mm · 1.41mm/px · z∈[-95,+276]mm · 3 of 60 slices shown (5 of 5)]
[im 1/60]
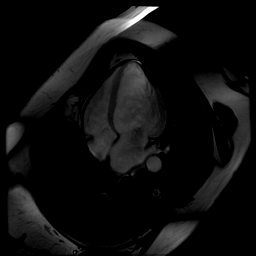
[im 30/60]
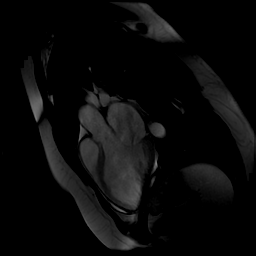
[im 60/60]
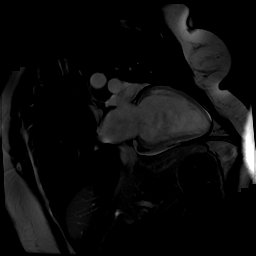

[Series 12: delayed ir prep · oblique · 8.0mm · 1.37mm/px · 1 of 14 slices shown]
[im 1/14]
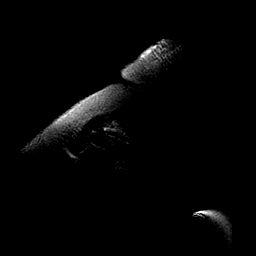

[Series 13: rad mde · axial · 8.0mm · 1.41mm/px · 1 of 3 slices shown]
[im 1/3]
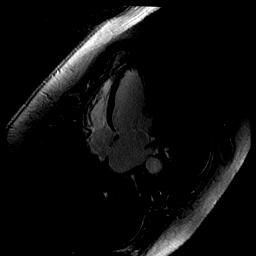

[39 of 40 positions shown; findings below may reference images not displayed]

FINDINGS: Limited images of the lung fields show no significant abnormalities.
Moderate LV dilation with normal wall thickness. There diffuse
hypokinesis with septal-lateral dyssynchrony, EF calculated to [DATE] not be that low as calculation difficult with dyssynchony).
Normal right ventricular size with moderate to severely decreased
systolic function, EF 27%. Normal right atrial size. Trileaflet
aortic valve with no stenosis, trivial AI. There was moderate mitral
regurgitation by regurgitant fraction 39% (again, accuracy
questioned given LV dyssynchrony making LV calculations more
difficult).

On delayed enhancement imaging, there was no definite myocardial
late gadolinium enhancement (LGE) noted.

MEASUREMENTS:
MEASUREMENTS
LVEDV 238 mL

LV SV 44 mL

LV EF 18%

RVEDV 140 mL

RV SV 38 mL

RV EF 27%

Aortic forward volume 27 mL

Mitral regurgitant fraction 39%
IMPRESSION: 1. Moderate dilated left ventricle with severe diffuse hypokinesis,
EF 18%. There was septal-lateral dyssynchrony.

2. Normal right ventricular size with moderate to severely decreased
systolic function, EF 27%.

3. At least moderate mitral regurgitation. Regurgitant fraction 39%.

4. Calculation of LV EF and MV regurgitant fraction may not be
completely accurate given septal-lateral dyssynchrony.

Jean Pi Dallos

## 2018-06-04 ENCOUNTER — Other Ambulatory Visit (HOSPITAL_COMMUNITY): Payer: Self-pay | Admitting: *Deleted

## 2018-06-04 MED ORDER — FUROSEMIDE 20 MG PO TABS: 20.0000 mg | ORAL_TABLET | Freq: Every day | ORAL | 3 refills | Status: DC | PRN

## 2018-06-13 ENCOUNTER — Encounter (HOSPITAL_COMMUNITY): Payer: Self-pay

## 2018-06-13 NOTE — Progress Notes (Signed)
[facility] Approval faxed for patient coverage effective 05/02/2018-06/01/2019. Copy of approval scanned in to patient's electronic medical record.  Renee Pain, RN

## 2018-08-06 ENCOUNTER — Other Ambulatory Visit (HOSPITAL_COMMUNITY): Payer: Self-pay | Admitting: Internal Medicine

## 2018-08-08 ENCOUNTER — Telehealth (HOSPITAL_COMMUNITY): Payer: Self-pay | Admitting: Pharmacist

## 2018-08-08 NOTE — Telephone Encounter (Signed)
Corlanor PA approved by [employer] through 08/06/19.  Ruta Hinds. Velva Harman, PharmD, BCPS, CPP Clinical Pharmacist Phone: [phone] 08/08/2018 3:42 PM

## 2018-08-21 ENCOUNTER — Other Ambulatory Visit (HOSPITAL_COMMUNITY): Payer: Self-pay | Admitting: Internal Medicine

## 2018-10-24 ENCOUNTER — Encounter (HOSPITAL_COMMUNITY): Payer: Self-pay | Admitting: Internal Medicine

## 2018-10-24 ENCOUNTER — Ambulatory Visit (HOSPITAL_COMMUNITY)
Admission: RE | Admit: 2018-10-24 | Discharge: 2018-10-24 | Disposition: A | Discharge status: Home / Self Care | Payer: Managed Care, Other (non HMO) | Source: Ambulatory Visit | Attending: Internal Medicine | Admitting: Internal Medicine

## 2018-10-24 VITALS — BP 110/68 | HR 71 | Wt 228.0 lb

## 2018-10-24 DIAGNOSIS — Z809 Family history of malignant neoplasm, unspecified: Secondary | ICD-10-CM | POA: Insufficient documentation

## 2018-10-24 DIAGNOSIS — I429 Cardiomyopathy, unspecified: Secondary | ICD-10-CM | POA: Insufficient documentation

## 2018-10-24 DIAGNOSIS — I428 Other cardiomyopathies: Secondary | ICD-10-CM

## 2018-10-24 DIAGNOSIS — Z794 Long term (current) use of insulin: Secondary | ICD-10-CM | POA: Diagnosis not present

## 2018-10-24 DIAGNOSIS — E669 Obesity, unspecified: Secondary | ICD-10-CM | POA: Insufficient documentation

## 2018-10-24 DIAGNOSIS — Z833 Family history of diabetes mellitus: Secondary | ICD-10-CM | POA: Diagnosis not present

## 2018-10-24 DIAGNOSIS — E119 Type 2 diabetes mellitus without complications: Secondary | ICD-10-CM | POA: Diagnosis not present

## 2018-10-24 DIAGNOSIS — Z79899 Other long term (current) drug therapy: Secondary | ICD-10-CM | POA: Insufficient documentation

## 2018-10-24 DIAGNOSIS — R002 Palpitations: Secondary | ICD-10-CM | POA: Diagnosis not present

## 2018-10-24 DIAGNOSIS — R000 Tachycardia, unspecified: Secondary | ICD-10-CM | POA: Insufficient documentation

## 2018-10-24 DIAGNOSIS — I110 Hypertensive heart disease with heart failure: Secondary | ICD-10-CM | POA: Insufficient documentation

## 2018-10-24 DIAGNOSIS — I5022 Chronic systolic (congestive) heart failure: Secondary | ICD-10-CM | POA: Diagnosis present

## 2018-10-24 DIAGNOSIS — Z8543 Personal history of malignant neoplasm of ovary: Secondary | ICD-10-CM | POA: Insufficient documentation

## 2018-10-24 NOTE — Patient Instructions (Signed)
Your provider has recommended that  you wear a Zio Patch for 14 days.  This monitor will record your heart rhythm for our review.  IF you have any symptoms while wearing the monitor please press the button.  If you have any issues with the patch or you notice a red or orange light on it please call the company at [phone].  Once you remove the patch please mail it back to the company as soon as possible so we can get the results.  Follow up with Dr. Haroldine Laws in 6 months

## 2018-10-24 NOTE — Progress Notes (Signed)
Advanced Heart Failure Clinic Note   Primary Cardiologist: Dr. Haroldine Laws   HPI: Renee Howell a 52 y.o.female with a h/o obesity, HTN. ADD and poorly-controlled DM2, chronic systolic CHF (EF 66-44%).   Diagnosed in June 2018 with NICM, systolic CHF. She presented to Dr. Macky Lower office (for an evaluation of tachycardia)and reported having significant issues with her breathing over the past 6 months. Had recently had PNA a few months prior. She was found to have an EF of 15-20%. Was markedly overloaded in his office and therefore hospital admission was reccommeded.   Admitted 05/22/17-05/29/17. Required milrinone for low output HF. Diuresed over 20 pounds with milrinone and IV Lasix. RHC showed Fick output/index 4.4/2.2 on 0.125 mcg Milrinone. Cardiac MRI without infiltrative disease. Milrinone weaned off. It was felt that her cardiomyopathy was related to either undiagnosed sleep apnea, viral or tachy induced (she has been on stimulants for ADD for a long time). Started on corlanor for tachycardia, although tachycardia likely represented the low output HF as well. Her stimulants were stopped at discharge. Discharge weight was 197 pounds.    She returns today for follow up. Overall doing ok. Main issues is having trouble sleeping at night. Taking Sonata and melatonin. Occasional chest heaviness but it is transient.  Not exercising regularly. Taking care of her granddaughter. A1c now down to 7.0 (down from 12). Able to do all ADLs. Occasional LLE edema. Not using CPAP. Occasional tachypalpitations lasting < 30 seconds. At least once a week.    Bedside echo 11/18 30-35% RV ok.  Echo 1/19  EF 50-55% RV normal   Past Medical History:  Diagnosis Date  . Allergy   . CHF (congestive heart failure) ([place])   . Diabetes mellitus   . Hypertension   . Kidney infection   . Ovarian cancer ([place])   . Sleep apnea     Current Outpatient Medications  Medication Sig Dispense Refill  .  atorvastatin (LIPITOR) 10 MG tablet Take 10 mg by mouth daily.    . carvedilol (COREG) 6.25 MG tablet Take 1.5 tablets (9.375 mg total) by mouth 2 (two) times daily. 270 tablet 3  . cholecalciferol (VITAMIN D) 1000 units tablet Take 2,000 Units by mouth daily.    Steffanie Dunn 5 MG TABS tablet TAKE 1 TABLET TWICE A DAY WITH MEALS 180 tablet 1  . DULoxetine (CYMBALTA) 30 MG capsule Take 90 mg by mouth daily.    . empagliflozin (JARDIANCE) 10 MG TABS tablet Take 25 mg by mouth daily.     . fluticasone (FLONASE) 50 MCG/ACT nasal spray Place 2 sprays into both nostrils daily.    . insulin lispro (HUMALOG) 100 UNIT/ML injection Inject 2-8 Units into the skin 3 (three) times daily as needed for high blood sugar. Per sliding scale    . Melatonin 1 MG TABS Take 3-5 mg by mouth at bedtime as needed.    . metFORMIN (GLUCOPHAGE) 1000 MG tablet Take 1,000 mg by mouth 2 (two) times daily.    . montelukast (SINGULAIR) 10 MG tablet Take 10 mg by mouth daily.  0  . ranitidine (ZANTAC) 150 MG tablet Take 150 mg by mouth daily.    . sacubitril-valsartan (ENTRESTO) 49-51 MG Take 1 tablet by mouth 2 (two) times daily. 180 tablet 4  . spironolactone (ALDACTONE) 25 MG tablet TAKE 1 TABLET DAILY 90 tablet 1  . zaleplon (SONATA) 5 MG capsule 1-2 caps for sleep at night as needed 30 capsule 2  . furosemide (LASIX) 20 MG  tablet Take 1 tablet (20 mg total) by mouth daily as needed. Weight gain >3 lb in 1 day or >5 lb in 1 week (Patient not taking: Reported on 10/24/2018) 30 tablet 3   No current facility-administered medications for this encounter.     Allergies  Allergen Reactions  . Trulicity [Dulaglutide] Other (See Comments)    Pt states that the trulicity makes her feel horrible three days after she uses it. States she will never take it again.   . Latex Rash      Social History   Socioeconomic History  . Marital status: Married    Spouse name: Not on file  . Number of children: 2  . Years of education: Not  on file  . Highest education level: Not on file  Occupational History  . Occupation: unemployed  Social Needs  . Financial resource strain: Not on file  . Food insecurity:    Worry: Not on file    Inability: Not on file  . Transportation needs:    Medical: Not on file    Non-medical: Not on file  Tobacco Use  . Smoking status: Never Smoker  . Smokeless tobacco: Never Used  Substance and Sexual Activity  . Alcohol use: Yes    Comment: occ  . Drug use: No  . Sexual activity: Not on file  Lifestyle  . Physical activity:    Days per week: Not on file    Minutes per session: Not on file  . Stress: Not on file  Relationships  . Social connections:    Talks on phone: Not on file    Gets together: Not on file    Attends religious service: Not on file    Active member of club or organization: Not on file    Attends meetings of clubs or organizations: Not on file    Relationship status: Not on file  . Intimate partner violence:    Fear of current or ex partner: Not on file    Emotionally abused: Not on file    Physically abused: Not on file    Forced sexual activity: Not on file  Other Topics Concern  . Not on file  Social History Narrative  . Not on file      Family History  Problem Relation Age of Onset  . Cancer Mother   . Diabetes Father   . Bipolar disorder Brother   . Dementia Brother   . Diabetes Brother     Vitals:   10/24/18 1158  BP: 110/68  Pulse: 71  SpO2: 98%  Weight: 103.4 kg (228 lb)   Wt Readings from Last 3 Encounters:  10/24/18 103.4 kg (228 lb)  12/20/17 89.3 kg (196 lb 12.8 oz)  10/18/17 88.6 kg (195 lb 6.4 oz)    PHYSICAL EXAM: General:  Well appearing. No resp difficulty HEENT: normal Neck: supple. no JVD. Carotids 2+ bilat; no bruits. No lymphadenopathy or thryomegaly appreciated. Cor: PMI nondisplaced. Regular rate & rhythm. No rubs, gallops or murmurs. Lungs: clear Abdomen: obese soft, nontender, nondistended. No  hepatosplenomegaly. No bruits or masses. Good bowel sounds. Extremities: no cyanosis, clubbing, rash, edema Neuro: alert & orientedx3, cranial nerves grossly intact. moves all 4 extremities w/o difficulty. Affect pleasant   NSR 69 No ST-T wave abnormalities. Personally reviewed   ASSESSMENT & PLAN: 1. Chronic systolic CHF: Echo EF 74% with biventricular dysfunction. 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 DM. Normal cors by cath in 04/2017. Cardiac MRI without infiltrative disease.  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 RV normal. - Complete recovery with medical therapy - Stable NYHA II - Takes lasix only as needed Volume status looks good on spiro, Entresto and Jardiance. - Continue Spiro 25 mg daily.  - Continue Entresto 49/51 mg BID  - Continue carvedilol to 9.375 bid. - Continue ivabradine 5 bid - suspect she may have tachy-induced CM and this seems to have made significant difference so will not wean.  - repeat echo in 6 months to ensure stability of LV function. - Encouraged her to be more active  2. DM - Continue Jardiance.    3. ADD - No longer on stimulants. Would avoid in future if possible   5. Probable OSA -  Follows with Dr. Maxwell Caul. 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   6. Sinus tach - Improved on corlanor 5 mg BID and carvedilol. HR 69 on ECG today  7. Palpitations - Place zio patch to exclude PAF  Glori Bickers, MD 10/24/18

## 2018-11-25 ENCOUNTER — Other Ambulatory Visit (HOSPITAL_COMMUNITY): Payer: Self-pay | Admitting: Internal Medicine

## 2018-12-12 DIAGNOSIS — E1165 Type 2 diabetes mellitus with hyperglycemia: Secondary | ICD-10-CM | POA: Diagnosis not present

## 2018-12-12 DIAGNOSIS — E7800 Pure hypercholesterolemia, unspecified: Secondary | ICD-10-CM | POA: Diagnosis not present

## 2018-12-19 DIAGNOSIS — Z9641 Presence of insulin pump (external) (internal): Secondary | ICD-10-CM | POA: Diagnosis not present

## 2018-12-19 DIAGNOSIS — I10 Essential (primary) hypertension: Secondary | ICD-10-CM | POA: Diagnosis not present

## 2018-12-19 DIAGNOSIS — E1165 Type 2 diabetes mellitus with hyperglycemia: Secondary | ICD-10-CM | POA: Diagnosis not present

## 2018-12-19 DIAGNOSIS — E7800 Pure hypercholesterolemia, unspecified: Secondary | ICD-10-CM | POA: Diagnosis not present

## 2019-01-07 ENCOUNTER — Telehealth (HOSPITAL_COMMUNITY): Payer: Self-pay

## 2019-01-07 NOTE — Telephone Encounter (Signed)
Notes recorded by Jolaine Artist, MD on 01/06/2019 at 1:17 PM EST Brief runs SVT/NSVT. Stable. Continue medical therapy.  Called and left a message to call back.

## 2019-01-07 NOTE — Telephone Encounter (Signed)
-----   Message from Jolaine Artist, MD sent at 01/06/2019  1:17 PM EST ----- Brief runs SVT/NSVT. Stable. Continue medical therapy.

## 2019-01-08 DIAGNOSIS — E119 Type 2 diabetes mellitus without complications: Secondary | ICD-10-CM | POA: Diagnosis not present

## 2019-01-10 ENCOUNTER — Telehealth (HOSPITAL_COMMUNITY): Payer: Self-pay

## 2019-01-10 NOTE — Telephone Encounter (Signed)
Relayed results to patient. Pt aware and verbalized understanding.

## 2019-01-10 NOTE — Telephone Encounter (Signed)
-----   Message from Jolaine Artist, MD sent at 01/06/2019  1:17 PM EST ----- Brief runs SVT/NSVT. Stable. Continue medical therapy.

## 2019-02-03 ENCOUNTER — Other Ambulatory Visit (HOSPITAL_COMMUNITY): Payer: Self-pay | Admitting: Internal Medicine

## 2019-02-11 DIAGNOSIS — Z794 Long term (current) use of insulin: Secondary | ICD-10-CM | POA: Diagnosis not present

## 2019-02-11 DIAGNOSIS — E119 Type 2 diabetes mellitus without complications: Secondary | ICD-10-CM | POA: Diagnosis not present

## 2019-02-17 ENCOUNTER — Other Ambulatory Visit (HOSPITAL_COMMUNITY): Payer: Self-pay | Admitting: Internal Medicine

## 2019-03-13 DIAGNOSIS — F419 Anxiety disorder, unspecified: Secondary | ICD-10-CM | POA: Diagnosis not present

## 2019-03-27 DIAGNOSIS — E1169 Type 2 diabetes mellitus with other specified complication: Secondary | ICD-10-CM | POA: Diagnosis not present

## 2019-04-01 ENCOUNTER — Other Ambulatory Visit (HOSPITAL_COMMUNITY): Payer: Self-pay | Admitting: *Deleted

## 2019-04-01 DIAGNOSIS — E119 Type 2 diabetes mellitus without complications: Secondary | ICD-10-CM | POA: Diagnosis not present

## 2019-04-01 DIAGNOSIS — Z794 Long term (current) use of insulin: Secondary | ICD-10-CM | POA: Diagnosis not present

## 2019-04-01 MED ORDER — ATORVASTATIN CALCIUM 10 MG PO TABS: 10.0000 mg | ORAL_TABLET | Freq: Every day | ORAL | 3 refills | Status: DC

## 2019-04-24 DIAGNOSIS — E1169 Type 2 diabetes mellitus with other specified complication: Secondary | ICD-10-CM | POA: Diagnosis not present

## 2019-06-03 DIAGNOSIS — E1169 Type 2 diabetes mellitus with other specified complication: Secondary | ICD-10-CM | POA: Diagnosis not present

## 2019-06-05 DIAGNOSIS — Z794 Long term (current) use of insulin: Secondary | ICD-10-CM | POA: Diagnosis not present

## 2019-06-05 DIAGNOSIS — E119 Type 2 diabetes mellitus without complications: Secondary | ICD-10-CM | POA: Diagnosis not present

## 2019-06-07 DIAGNOSIS — L821 Other seborrheic keratosis: Secondary | ICD-10-CM | POA: Diagnosis not present

## 2019-06-07 DIAGNOSIS — L814 Other melanin hyperpigmentation: Secondary | ICD-10-CM | POA: Diagnosis not present

## 2019-06-07 DIAGNOSIS — D485 Neoplasm of uncertain behavior of skin: Secondary | ICD-10-CM | POA: Diagnosis not present

## 2019-06-07 DIAGNOSIS — D2272 Melanocytic nevi of left lower limb, including hip: Secondary | ICD-10-CM | POA: Diagnosis not present

## 2019-06-18 DIAGNOSIS — R197 Diarrhea, unspecified: Secondary | ICD-10-CM | POA: Diagnosis not present

## 2019-06-18 DIAGNOSIS — R1114 Bilious vomiting: Secondary | ICD-10-CM | POA: Diagnosis not present

## 2019-06-18 DIAGNOSIS — R634 Abnormal weight loss: Secondary | ICD-10-CM | POA: Diagnosis not present

## 2019-06-18 DIAGNOSIS — R1033 Periumbilical pain: Secondary | ICD-10-CM | POA: Diagnosis not present

## 2019-06-18 DIAGNOSIS — K219 Gastro-esophageal reflux disease without esophagitis: Secondary | ICD-10-CM | POA: Diagnosis not present

## 2019-07-11 DIAGNOSIS — Z794 Long term (current) use of insulin: Secondary | ICD-10-CM | POA: Diagnosis not present

## 2019-07-11 DIAGNOSIS — E119 Type 2 diabetes mellitus without complications: Secondary | ICD-10-CM | POA: Diagnosis not present

## 2019-07-31 ENCOUNTER — Other Ambulatory Visit: Payer: Self-pay

## 2019-07-31 ENCOUNTER — Encounter (HOSPITAL_COMMUNITY): Payer: Self-pay | Admitting: Internal Medicine

## 2019-07-31 ENCOUNTER — Ambulatory Visit (HOSPITAL_COMMUNITY)
Admission: RE | Admit: 2019-07-31 | Discharge: 2019-07-31 | Disposition: A | Discharge status: Home / Self Care | Payer: BC Managed Care – PPO | Source: Ambulatory Visit | Attending: Internal Medicine | Admitting: Internal Medicine

## 2019-07-31 VITALS — BP 92/62 | HR 74 | Wt 220.4 lb

## 2019-07-31 DIAGNOSIS — Z560 Unemployment, unspecified: Secondary | ICD-10-CM | POA: Diagnosis not present

## 2019-07-31 DIAGNOSIS — E118 Type 2 diabetes mellitus with unspecified complications: Secondary | ICD-10-CM | POA: Diagnosis not present

## 2019-07-31 DIAGNOSIS — Z79899 Other long term (current) drug therapy: Secondary | ICD-10-CM | POA: Diagnosis not present

## 2019-07-31 DIAGNOSIS — I5022 Chronic systolic (congestive) heart failure: Secondary | ICD-10-CM | POA: Insufficient documentation

## 2019-07-31 DIAGNOSIS — R000 Tachycardia, unspecified: Secondary | ICD-10-CM | POA: Diagnosis not present

## 2019-07-31 DIAGNOSIS — Z794 Long term (current) use of insulin: Secondary | ICD-10-CM | POA: Insufficient documentation

## 2019-07-31 DIAGNOSIS — R5383 Other fatigue: Secondary | ICD-10-CM | POA: Diagnosis not present

## 2019-07-31 DIAGNOSIS — I428 Other cardiomyopathies: Secondary | ICD-10-CM

## 2019-07-31 DIAGNOSIS — I110 Hypertensive heart disease with heart failure: Secondary | ICD-10-CM | POA: Insufficient documentation

## 2019-07-31 MED ORDER — CARVEDILOL 6.25 MG PO TABS: 6.2500 mg | ORAL_TABLET | Freq: Two times a day (BID) | ORAL | 3 refills | Status: DC

## 2019-07-31 MED ORDER — SACUBITRIL-VALSARTAN 24-26 MG PO TABS: 1.0000 | ORAL_TABLET | Freq: Two times a day (BID) | ORAL | 6 refills | Status: DC

## 2019-07-31 MED ORDER — SACUBITRIL-VALSARTAN 24-26 MG PO TABS: 1.0000 | ORAL_TABLET | Freq: Two times a day (BID) | ORAL | 3 refills | Status: DC

## 2019-07-31 NOTE — Patient Instructions (Signed)
DECREASE Carvedilol to 6.25mg  (1 tab) twice a day  DECREASE Entresto to 24/26mg  (1 tab) twice a day  Your physician has requested that you have an echocardiogram. Echocardiography is a painless test that uses sound waves to create images of your heart. It provides your doctor with information about the size and shape of your heart and how well your heart's chambers and valves are working. This procedure takes approximately one hour. There are no restrictions for this procedure.  Your physician recommends that you schedule a follow-up appointment in: 6 months with an ECHO.  You will be called to schedule this appointment.   At the [facility], you and your health needs are our priority. As part of our continuing mission to provide you with exceptional heart care, we have created designated Provider Care Teams. These Care Teams include your primary Cardiologist (physician) and Advanced Practice Providers (APPs- Physician Assistants and Nurse Practitioners) who all work together to provide you with the care you need, when you need it.   You may see any of the following providers on your designated Care Team at your next follow up: Marland Kitchen Dr Glori Bickers . Dr Loralie Champagne . Darrick Grinder, NP   Please be sure to bring in all your medications bottles to every appointment.

## 2019-07-31 NOTE — Progress Notes (Signed)
Advanced Heart Failure Clinic Note   Primary Cardiologist: Dr. Haroldine Laws   HPI: Renee Howell a 53 y.o.female with a h/o obesity, HTN. ADD and poorly-controlled DM2, chronic systolic CHF (EF 0000000).   Diagnosed in June 2018 with NICM, systolic CHF. She presented to Dr. Macky Lower office (for an evaluation of tachycardia)and reported having significant issues with her breathing over the past 6 months. Had recently had PNA a few months prior. She was found to have an EF of 15-20%. Was markedly overloaded in his office and therefore hospital admission was reccommeded.   Admitted 05/22/17-05/29/17. Required milrinone for low output HF. Diuresed over 20 pounds with milrinone and IV Lasix. RHC showed Fick output/index 4.4/2.2 on 0.125 mcg Milrinone. Cardiac MRI without infiltrative disease. Milrinone weaned off. It was felt that her cardiomyopathy was related to either undiagnosed sleep apnea, viral or tachy induced (she has been on stimulants for ADD for a long time). Started on corlanor for tachycardia, although tachycardia likely represented the low output HF as well. Her stimulants were stopped at discharge. Discharge weight was 197 pounds.    Echo 1/19  EF 50-55% RV normal   She returns today for follow up. Overall doing ok. Still with occasional heaviness in her chest. Feels very fatigued. Wonders if it may be due to being off adderall. Had been diagnosed with idiopathic insomnia. Follows with Dr. Isidoro Donning. Denies edema, orthopnea or PND. Not taking lasix. Watching granddaughter 10 hours per day. Orthostatic at times    Bedside echo 11/18 30-35% RV ok.    Past Medical History:  Diagnosis Date  . Allergy   . CHF (congestive heart failure) ([place])   . Diabetes mellitus   . Hypertension   . Kidney infection   . Ovarian cancer ([place])   . Sleep apnea     Current Outpatient Medications  Medication Sig Dispense Refill  . atorvastatin (LIPITOR) 10 MG tablet Take 1 tablet (10 mg  total) by mouth daily. 90 tablet 3  . carvedilol (COREG) 6.25 MG tablet TAKE ONE AND ONE-HALF TABLETS TWICE A DAY (CHANGE IN DOSAGE) 270 tablet 3  . cholecalciferol (VITAMIN D) 1000 units tablet Take 2,000 Units by mouth daily.    Steffanie Dunn 5 MG TABS tablet TAKE 1 TABLET TWICE A DAY WITH MEALS 180 tablet 3  . DULoxetine (CYMBALTA) 30 MG capsule Take 90 mg by mouth daily.    . empagliflozin (JARDIANCE) 10 MG TABS tablet Take 10 mg by mouth daily.     Marland Kitchen ENTRESTO 49-51 MG TAKE 1 TABLET TWICE A DAY 180 tablet 3  . fluticasone (FLONASE) 50 MCG/ACT nasal spray Place 2 sprays into both nostrils daily.    . furosemide (LASIX) 20 MG tablet Take 1 tablet (20 mg total) by mouth daily as needed. Weight gain >3 lb in 1 day or >5 lb in 1 week 30 tablet 3  . insulin lispro (HUMALOG) 100 UNIT/ML injection Inject 89-90 Units into the skin daily. Per sliding scale    . Melatonin 1 MG TABS Take 5 mg by mouth at bedtime as needed.     . metFORMIN (GLUCOPHAGE) 1000 MG tablet Take 1,000 mg by mouth 2 (two) times daily.    . montelukast (SINGULAIR) 10 MG tablet Take 10 mg by mouth daily.  0  . pantoprazole (PROTONIX) 40 MG tablet TK 1 T PO QD    . ranitidine (ZANTAC) 150 MG tablet Take 150 mg by mouth daily.    Marland Kitchen spironolactone (ALDACTONE) 25 MG tablet TAKE  1 TABLET DAILY 90 tablet 3  . zaleplon (SONATA) 5 MG capsule 1-2 caps for sleep at night as needed 30 capsule 2   No current facility-administered medications for this encounter.     Allergies  Allergen Reactions  . Trulicity [Dulaglutide] Other (See Comments)    Pt states that the trulicity makes her feel horrible three days after she uses it. States she will never take it again.   . Latex Rash      Social History   Socioeconomic History  . Marital status: Married    Spouse name: Not on file  . Number of children: 2  . Years of education: Not on file  . Highest education level: Not on file  Occupational History  . Occupation: unemployed  Social  Needs  . Financial resource strain: Not on file  . Food insecurity    Worry: Not on file    Inability: Not on file  . Transportation needs    Medical: Not on file    Non-medical: Not on file  Tobacco Use  . Smoking status: Never Smoker  . Smokeless tobacco: Never Used  Substance and Sexual Activity  . Alcohol use: Yes    Comment: occ  . Drug use: No  . Sexual activity: Not on file  Lifestyle  . Physical activity    Days per week: Not on file    Minutes per session: Not on file  . Stress: Not on file  Relationships  . Social Herbalist on phone: Not on file    Gets together: Not on file    Attends religious service: Not on file    Active member of club or organization: Not on file    Attends meetings of clubs or organizations: Not on file    Relationship status: Not on file  . Intimate partner violence    Fear of current or ex partner: Not on file    Emotionally abused: Not on file    Physically abused: Not on file    Forced sexual activity: Not on file  Other Topics Concern  . Not on file  Social History Narrative  . Not on file      Family History  Problem Relation Age of Onset  . Cancer Mother   . Diabetes Father   . Bipolar disorder Brother   . Dementia Brother   . Diabetes Brother     Vitals:   07/31/19 0941  BP: 92/62  Pulse: 74  SpO2: 98%  Weight: 100 kg (220 lb 6.4 oz)   Wt Readings from Last 3 Encounters:  07/31/19 100 kg (220 lb 6.4 oz)  10/24/18 103.4 kg (228 lb)  12/20/17 89.3 kg (196 lb 12.8 oz)    PHYSICAL EXAM: General:  Well appearing. No resp difficulty HEENT: normal Neck: supple. no JVD. Carotids 2+ bilat; no bruits. No lymphadenopathy or thryomegaly appreciated. Cor: PMI nondisplaced. Regular rate & rhythm. No rubs, gallops or murmurs. Lungs: clear Abdomen: obese soft, nontender, nondistended. No hepatosplenomegaly. No bruits or masses. Good bowel sounds. Extremities: no cyanosis, clubbing, rash, edema Neuro: alert &  orientedx3, cranial nerves grossly intact. moves all 4 extremities w/o difficulty. Affect pleasant   ASSESSMENT & PLAN: 1. Chronic systolic CHF: Echo EF 0000000 with biventricular dysfunction. 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 DM. Normal cors by cath in 04/2017. Cardiac MRI without infiltrative disease. 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 RV normal. - Complete recovery with medical therapy -  I did bedside echo today EF 50-55%  - Stable NYHA II  - Takes lasix only as needed Volume status looks good on spiro, Entresto and Jardiance. - Continue Spiro 25 mg daily.  - With low BP and fatigue will decrease entresto to 24/26 bid and carvedilol to 6.25 bid - Continue ivabradine 5 bid - suspect she may have tachy-induced CM and this seems to have made significant difference so will not wean.  - repeat echo in 6 months to ensure stability of LV function with decrease inmeds. - Encouraged her to be more active  2. DM - Continue Jardiance.    3. ADD - No longer on stimulants. Would avoid in future if possible   5. Probable OSA -  Follows with Dr. Maxwell Caul. 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   6. Sinus tach - Improved on corlanor 5 mg BID and carvedilol. Will continue    Glori Bickers, MD 07/31/19

## 2019-08-14 DIAGNOSIS — Z794 Long term (current) use of insulin: Secondary | ICD-10-CM | POA: Diagnosis not present

## 2019-08-14 DIAGNOSIS — E119 Type 2 diabetes mellitus without complications: Secondary | ICD-10-CM | POA: Diagnosis not present

## 2019-09-25 DIAGNOSIS — E7800 Pure hypercholesterolemia, unspecified: Secondary | ICD-10-CM | POA: Diagnosis not present

## 2019-09-25 DIAGNOSIS — I10 Essential (primary) hypertension: Secondary | ICD-10-CM | POA: Diagnosis not present

## 2019-09-25 DIAGNOSIS — Z9641 Presence of insulin pump (external) (internal): Secondary | ICD-10-CM | POA: Diagnosis not present

## 2019-09-25 DIAGNOSIS — Z23 Encounter for immunization: Secondary | ICD-10-CM | POA: Diagnosis not present

## 2019-09-25 DIAGNOSIS — E1165 Type 2 diabetes mellitus with hyperglycemia: Secondary | ICD-10-CM | POA: Diagnosis not present

## 2019-10-31 ENCOUNTER — Telehealth (HOSPITAL_COMMUNITY): Payer: Self-pay | Admitting: Pharmacist

## 2019-10-31 NOTE — Telephone Encounter (Signed)
Patient Advocate Encounter   Received notification from [employer] that prior authorization for Corlanor is required.   PA submitted by fax Fax: [phone] Status is pending   Will continue to follow.  Audry Riles, PharmD, BCPS, BCCP, CPP Heart Failure Clinic Pharmacist [phone]

## 2019-11-14 DIAGNOSIS — Z794 Long term (current) use of insulin: Secondary | ICD-10-CM | POA: Diagnosis not present

## 2019-11-14 DIAGNOSIS — E119 Type 2 diabetes mellitus without complications: Secondary | ICD-10-CM | POA: Diagnosis not present

## 2019-11-20 DIAGNOSIS — G4711 Idiopathic hypersomnia with long sleep time: Secondary | ICD-10-CM | POA: Diagnosis not present

## 2019-11-27 DIAGNOSIS — M6530 Trigger finger, unspecified finger: Secondary | ICD-10-CM | POA: Diagnosis not present

## 2020-01-29 ENCOUNTER — Other Ambulatory Visit (HOSPITAL_COMMUNITY): Payer: Self-pay | Admitting: Internal Medicine

## 2020-02-12 ENCOUNTER — Other Ambulatory Visit (HOSPITAL_COMMUNITY): Payer: BC Managed Care – PPO

## 2020-02-12 ENCOUNTER — Encounter (HOSPITAL_COMMUNITY): Payer: BC Managed Care – PPO | Admitting: Internal Medicine

## 2020-02-12 DIAGNOSIS — H17823 Peripheral opacity of cornea, bilateral: Secondary | ICD-10-CM | POA: Diagnosis not present

## 2020-02-12 DIAGNOSIS — Z794 Long term (current) use of insulin: Secondary | ICD-10-CM | POA: Diagnosis not present

## 2020-02-12 DIAGNOSIS — E113293 Type 2 diabetes mellitus with mild nonproliferative diabetic retinopathy without macular edema, bilateral: Secondary | ICD-10-CM | POA: Diagnosis not present

## 2020-02-12 DIAGNOSIS — H40053 Ocular hypertension, bilateral: Secondary | ICD-10-CM | POA: Diagnosis not present

## 2020-02-12 DIAGNOSIS — E119 Type 2 diabetes mellitus without complications: Secondary | ICD-10-CM | POA: Diagnosis not present

## 2020-02-12 DIAGNOSIS — H2513 Age-related nuclear cataract, bilateral: Secondary | ICD-10-CM | POA: Diagnosis not present

## 2020-02-13 ENCOUNTER — Other Ambulatory Visit (HOSPITAL_COMMUNITY): Payer: Self-pay | Admitting: Internal Medicine

## 2020-02-26 ENCOUNTER — Ambulatory Visit (HOSPITAL_BASED_OUTPATIENT_CLINIC_OR_DEPARTMENT_OTHER)
Admission: RE | Admit: 2020-02-26 | Discharge: 2020-02-26 | Disposition: A | Discharge status: Home / Self Care | Payer: BC Managed Care – PPO | Source: Ambulatory Visit | Attending: Internal Medicine | Admitting: Internal Medicine

## 2020-02-26 ENCOUNTER — Ambulatory Visit (HOSPITAL_COMMUNITY)
Admission: RE | Admit: 2020-02-26 | Discharge: 2020-02-26 | Disposition: A | Discharge status: Home / Self Care | Payer: BC Managed Care – PPO | Source: Ambulatory Visit | Attending: Internal Medicine | Admitting: Internal Medicine

## 2020-02-26 ENCOUNTER — Encounter (HOSPITAL_COMMUNITY): Payer: Self-pay | Admitting: Internal Medicine

## 2020-02-26 ENCOUNTER — Other Ambulatory Visit: Payer: Self-pay

## 2020-02-26 VITALS — BP 126/68 | HR 77 | Ht 66.0 in | Wt 227.8 lb

## 2020-02-26 DIAGNOSIS — G4733 Obstructive sleep apnea (adult) (pediatric): Secondary | ICD-10-CM | POA: Diagnosis not present

## 2020-02-26 DIAGNOSIS — R42 Dizziness and giddiness: Secondary | ICD-10-CM | POA: Diagnosis not present

## 2020-02-26 DIAGNOSIS — R000 Tachycardia, unspecified: Secondary | ICD-10-CM | POA: Diagnosis not present

## 2020-02-26 DIAGNOSIS — R0789 Other chest pain: Secondary | ICD-10-CM | POA: Diagnosis not present

## 2020-02-26 DIAGNOSIS — E669 Obesity, unspecified: Secondary | ICD-10-CM | POA: Insufficient documentation

## 2020-02-26 DIAGNOSIS — E119 Type 2 diabetes mellitus without complications: Secondary | ICD-10-CM | POA: Diagnosis not present

## 2020-02-26 DIAGNOSIS — I5022 Chronic systolic (congestive) heart failure: Secondary | ICD-10-CM

## 2020-02-26 DIAGNOSIS — R5383 Other fatigue: Secondary | ICD-10-CM | POA: Insufficient documentation

## 2020-02-26 DIAGNOSIS — Z794 Long term (current) use of insulin: Secondary | ICD-10-CM | POA: Diagnosis not present

## 2020-02-26 DIAGNOSIS — Z7901 Long term (current) use of anticoagulants: Secondary | ICD-10-CM | POA: Insufficient documentation

## 2020-02-26 DIAGNOSIS — I428 Other cardiomyopathies: Secondary | ICD-10-CM | POA: Diagnosis not present

## 2020-02-26 DIAGNOSIS — F5101 Primary insomnia: Secondary | ICD-10-CM | POA: Diagnosis not present

## 2020-02-26 DIAGNOSIS — Z79899 Other long term (current) drug therapy: Secondary | ICD-10-CM | POA: Insufficient documentation

## 2020-02-26 DIAGNOSIS — I429 Cardiomyopathy, unspecified: Secondary | ICD-10-CM | POA: Diagnosis not present

## 2020-02-26 DIAGNOSIS — I110 Hypertensive heart disease with heart failure: Secondary | ICD-10-CM | POA: Insufficient documentation

## 2020-02-26 NOTE — Progress Notes (Addendum)
Advanced Heart Failure Clinic Note   Primary Cardiologist: Dr. Haroldine Laws   HPI: Renee Howell a 54 y.o.female with a h/o obesity, HTN. ADD and poorly-controlled DM2, chronic systolic CHF (EF 0000000).   Diagnosed in June 2018 with NICM, systolic CHF. She presented to Dr. Macky Lower office (for an evaluation of tachycardia)and reported having significant issues with her breathing over the past 6 months. Had recently had PNA a few months prior. She was found to have an EF of 15-20%. Was markedly overloaded in his office and therefore hospital admission was reccommeded.   Admitted 05/22/17-05/29/17. Required milrinone for low output HF. Diuresed over 20 pounds with milrinone and IV Lasix. RHC showed Fick output/index 4.4/2.2 on 0.125 mcg Milrinone. Cardiac MRI without infiltrative disease. Milrinone weaned off. It was felt that her cardiomyopathy was related to either undiagnosed sleep apnea, viral or tachy induced (she has been on stimulants for ADD for a long time). Started on corlanor for tachycardia, although tachycardia likely represented the low output HF as well. Her stimulants were stopped at discharge. Discharge weight was 197 pounds.     She returns today for follow up. Overall doing ok. Still with occasional heaviness in her chest. Feels very fatigued. Wonders if it may be due to being off adderall. Had been diagnosed with idiopathic insomnia. Follows with Dr. Isidoro Donning. Denies edema, orthopnea or PND. Not taking lasix. Watching granddaughter 10 hours per day. Orthostatic at times   Echo today 02/26/20 EF 55-60% Grade I DD. RV normal. Personally reviewed  Here for routine f/u. Overall doing fairly well. Gets around and watches her granddaughter. Can do day-to-day activities without too much problem. Occasional chest pressure with stress. Occasionally gets dizzy when she stands up. BP 120-130s usually. Can go to 140.    Echo 1/19  EF 50-55% RV normal  Bedside echo 11/18 30-35% RV  ok.    Past Medical History:  Diagnosis Date  . Allergy   . CHF (congestive heart failure) ([place])   . Diabetes mellitus   . Hypertension   . Kidney infection   . Ovarian cancer ([place])   . Sleep apnea     Current Outpatient Medications  Medication Sig Dispense Refill  . atorvastatin (LIPITOR) 10 MG tablet Take 1 tablet (10 mg total) by mouth daily. 90 tablet 3  . carvedilol (COREG) 6.25 MG tablet Take 1 tablet (6.25 mg total) by mouth 2 (two) times daily with a meal. 180 tablet 3  . cholecalciferol (VITAMIN D) 1000 units tablet Take 2,000 Units by mouth daily.    Steffanie Dunn 5 MG TABS tablet TAKE 1 TABLET TWICE A DAY WITH MEALS 180 tablet 3  . DULoxetine (CYMBALTA) 30 MG capsule Take 90 mg by mouth daily.    . empagliflozin (JARDIANCE) 10 MG TABS tablet Take 10 mg by mouth daily.     . fluticasone (FLONASE) 50 MCG/ACT nasal spray Place 2 sprays into both nostrils daily.    . furosemide (LASIX) 20 MG tablet Take 1 tablet (20 mg total) by mouth daily as needed. Weight gain >3 lb in 1 day or >5 lb in 1 week 30 tablet 3  . insulin lispro (HUMALOG) 100 UNIT/ML injection Inject 89-90 Units into the skin daily. Per sliding scale    . Melatonin 1 MG TABS Take 5 mg by mouth at bedtime as needed.     . metFORMIN (GLUCOPHAGE) 1000 MG tablet Take 1,000 mg by mouth 2 (two) times daily.    . montelukast (SINGULAIR) 10 MG  tablet Take 10 mg by mouth daily.  0  . pantoprazole (PROTONIX) 40 MG tablet TK 1 T PO QD    . ranitidine (ZANTAC) 150 MG tablet Take 150 mg by mouth daily.    . sacubitril-valsartan (ENTRESTO) 24-26 MG Take 1 tablet by mouth 2 (two) times daily. 180 tablet 3  . spironolactone (ALDACTONE) 25 MG tablet TAKE 1 TABLET DAILY 90 tablet 3  . zaleplon (SONATA) 5 MG capsule 1-2 caps for sleep at night as needed 30 capsule 2   No current facility-administered medications for this encounter.    Allergies  Allergen Reactions  . Trulicity [Dulaglutide] Other (See Comments)    Pt states  that the trulicity makes her feel horrible three days after she uses it. States she will never take it again.   . Latex Rash      Social History   Socioeconomic History  . Marital status: Married    Spouse name: Not on file  . Number of children: 2  . Years of education: Not on file  . Highest education level: Not on file  Occupational History  . Occupation: unemployed  Tobacco Use  . Smoking status: Never Smoker  . Smokeless tobacco: Never Used  Substance and Sexual Activity  . Alcohol use: Yes    Comment: occ  . Drug use: No  . Sexual activity: Not on file  Other Topics Concern  . Not on file  Social History Narrative  . Not on file   Social Determinants of Health   Financial Resource Strain:   . Difficulty of Paying Living Expenses:   Food Insecurity:   . Worried About Charity fundraiser in the Last Year:   . Arboriculturist in the Last Year:   Transportation Needs:   . Film/video editor (Medical):   Marland Kitchen Lack of Transportation (Non-Medical):   Physical Activity:   . Days of Exercise per Week:   . Minutes of Exercise per Session:   Stress:   . Feeling of Stress :   Social Connections:   . Frequency of Communication with Friends and Family:   . Frequency of Social Gatherings with Friends and Family:   . Attends Religious Services:   . Active Member of Clubs or Organizations:   . Attends Archivist Meetings:   Marland Kitchen Marital Status:   Intimate Partner Violence:   . Fear of Current or Ex-Partner:   . Emotionally Abused:   Marland Kitchen Physically Abused:   . Sexually Abused:       Family History  Problem Relation Age of Onset  . Cancer Mother   . Diabetes Father   . Bipolar disorder Brother   . Dementia Brother   . Diabetes Brother     Vitals:   02/26/20 1225  BP: 126/68  Pulse: 77  SpO2: 98%  Weight: 103.3 kg (227 lb 12.8 oz)  Height: 5\' 6"  (1.676 m)   Wt Readings from Last 3 Encounters:  07/31/19 100 kg (220 lb 6.4 oz)  10/24/18 103.4 kg (228  lb)  12/20/17 89.3 kg (196 lb 12.8 oz)    PHYSICAL EXAM: General:  Well appearing. No resp difficulty HEENT: normal Neck: supple. no JVD. Carotids 2+ bilat; no bruits. No lymphadenopathy or thryomegaly appreciated. Cor: PMI nondisplaced. Regular rate & rhythm. No rubs, gallops or murmurs. Lungs: clear Abdomen: obese soft, nontender, nondistended. No hepatosplenomegaly. No bruits or masses. Good bowel sounds. Extremities: no cyanosis, clubbing, rash, edema Neuro: alert & orientedx3, cranial  nerves grossly intact. moves all 4 extremities w/o difficulty. Affect pleasant   ASSESSMENT & PLAN:  1. Chronic systolic CHF: Echo EF 0000000 with biventricular dysfunction. 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 DM. Normal cors by cath in 04/2017. Cardiac MRI without infiltrative disease. 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 RV normal. - Complete recovery with medical therapy - Echo today 02/26/20 EF 55-60% Grade I DD. RV normal. Personally reviewed - Stable NYHA II - Volume status looks good on spiro, Entresto and Jardiance. - has not needed lasix  - Continue Spiro 25 mg daily.  - Has failed titration of Entresto and carvedilol in past due to low BP and fatigue - Continue ivabradine 5 bid - suspect she may have tachy-induced CM and this seems to have made significant difference so will not wean.   2. DM - On insulin pump and Dexcom. Last HgBA1c 6.6%  - Continue Jardiance   3. ADD - No longer on stimulants. Would avoid in future if possible   5. Mild OSA with idiopathic insomnia -  Follows with Dr. Maxwell Caul. 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  - takes sonata  6. Sinus tach - Improved on corlanor 5 mg BID and carvedilol. Will continue    Glori Bickers, MD 02/26/20

## 2020-02-26 NOTE — Patient Instructions (Signed)
It was great to see you today! No medication changes are needed at this time.   Your physician recommends that you schedule a follow-up appointment in: 12 months with Dr Bensimhon  Do the following things EVERYDAY: 1) Weigh yourself in the morning before breakfast. Write it down and keep it in a log. 2) Take your medicines as prescribed 3) Eat low salt foods--Limit salt (sodium) to 2000 mg per day.  4) Stay as active as you can everyday 5) Limit all fluids for the day to less than 2 liters  At [facility], you and your health needs are our priority. As part of our continuing mission to provide you with exceptional heart care, we have created designated Provider Care Teams. These Care Teams include your primary Cardiologist (physician) and Advanced Practice Providers (APPs- Physician Assistants and Nurse Practitioners) who all work together to provide you with the care you need, when you need it.   You may see any of the following providers on your designated Care Team at your next follow up: . Dr Daniel Bensimhon . Dr Dalton McLean . Amy Clegg, NP . Brittainy Simmons, PA . Lauren Kemp, PharmD   Please be sure to bring in all your medications bottles to every appointment.     

## 2020-02-26 NOTE — Progress Notes (Signed)
  Echocardiogram 2D Echocardiogram has been performed.  Jennette Dubin 02/26/2020, 11:42 AM

## 2020-03-03 ENCOUNTER — Other Ambulatory Visit (HOSPITAL_COMMUNITY): Payer: Self-pay | Admitting: Internal Medicine

## 2020-03-04 ENCOUNTER — Other Ambulatory Visit (HOSPITAL_COMMUNITY): Payer: Self-pay | Admitting: Internal Medicine

## 2020-03-11 DIAGNOSIS — I10 Essential (primary) hypertension: Secondary | ICD-10-CM | POA: Diagnosis not present

## 2020-03-11 DIAGNOSIS — Z9641 Presence of insulin pump (external) (internal): Secondary | ICD-10-CM | POA: Diagnosis not present

## 2020-03-11 DIAGNOSIS — E1165 Type 2 diabetes mellitus with hyperglycemia: Secondary | ICD-10-CM | POA: Diagnosis not present

## 2020-03-11 DIAGNOSIS — E7800 Pure hypercholesterolemia, unspecified: Secondary | ICD-10-CM | POA: Diagnosis not present

## 2020-04-01 ENCOUNTER — Other Ambulatory Visit: Payer: Self-pay | Admitting: Family Medicine

## 2020-04-01 DIAGNOSIS — Z1231 Encounter for screening mammogram for malignant neoplasm of breast: Secondary | ICD-10-CM

## 2020-04-09 DIAGNOSIS — L820 Inflamed seborrheic keratosis: Secondary | ICD-10-CM | POA: Diagnosis not present

## 2020-04-09 DIAGNOSIS — D485 Neoplasm of uncertain behavior of skin: Secondary | ICD-10-CM | POA: Diagnosis not present

## 2020-04-30 ENCOUNTER — Telehealth (HOSPITAL_COMMUNITY): Payer: Self-pay | Admitting: Pharmacy Technician

## 2020-04-30 NOTE — Telephone Encounter (Signed)
Spoke with patient. She received notification from her insurance that they would not cover Corlanor after 7/1, as it will no loner be on their formulary.   Will need to attempt a PA for the medication. Advised patient to get a 90 day supply while she can to get her through any hold ups with the insurance.   Will follow up.

## 2020-05-05 DIAGNOSIS — Z794 Long term (current) use of insulin: Secondary | ICD-10-CM | POA: Diagnosis not present

## 2020-05-05 DIAGNOSIS — E119 Type 2 diabetes mellitus without complications: Secondary | ICD-10-CM | POA: Diagnosis not present

## 2020-05-12 DIAGNOSIS — Z20822 Contact with and (suspected) exposure to covid-19: Secondary | ICD-10-CM | POA: Diagnosis not present

## 2020-05-29 NOTE — Telephone Encounter (Signed)
Attempted to check the Corlanor co-pay for patient and her insurance is not working at this time. I called and left her a message to follow up with me.

## 2020-06-08 DIAGNOSIS — L820 Inflamed seborrheic keratosis: Secondary | ICD-10-CM | POA: Diagnosis not present

## 2020-06-08 DIAGNOSIS — D1801 Hemangioma of skin and subcutaneous tissue: Secondary | ICD-10-CM | POA: Diagnosis not present

## 2020-06-08 DIAGNOSIS — L812 Freckles: Secondary | ICD-10-CM | POA: Diagnosis not present

## 2020-06-08 DIAGNOSIS — L814 Other melanin hyperpigmentation: Secondary | ICD-10-CM | POA: Diagnosis not present

## 2020-06-08 DIAGNOSIS — L821 Other seborrheic keratosis: Secondary | ICD-10-CM | POA: Diagnosis not present

## 2020-06-10 DIAGNOSIS — H40053 Ocular hypertension, bilateral: Secondary | ICD-10-CM | POA: Diagnosis not present

## 2020-06-15 NOTE — Telephone Encounter (Signed)
Checked patients benefits again today. It looks like she has her name listed as Renee Howell with her insurance instead of Renee Howell. I was able to override the refill too soon Corlanor rejection on her insurance. There was no indication of a PA needed at this time.  Will be available to assist in the future as needed.  Charlann Boxer, CPhT

## 2020-06-16 ENCOUNTER — Ambulatory Visit (INDEPENDENT_AMBULATORY_CARE_PROVIDER_SITE_OTHER): Payer: BC Managed Care – PPO

## 2020-06-16 ENCOUNTER — Other Ambulatory Visit: Payer: Self-pay

## 2020-06-16 ENCOUNTER — Other Ambulatory Visit: Payer: Self-pay | Admitting: Podiatry

## 2020-06-16 ENCOUNTER — Encounter: Payer: Self-pay | Admitting: Podiatry

## 2020-06-16 ENCOUNTER — Ambulatory Visit (INDEPENDENT_AMBULATORY_CARE_PROVIDER_SITE_OTHER): Payer: BC Managed Care – PPO | Admitting: Podiatry

## 2020-06-16 DIAGNOSIS — G4711 Idiopathic hypersomnia with long sleep time: Secondary | ICD-10-CM | POA: Diagnosis not present

## 2020-06-16 DIAGNOSIS — M722 Plantar fascial fibromatosis: Secondary | ICD-10-CM

## 2020-06-16 DIAGNOSIS — M79672 Pain in left foot: Secondary | ICD-10-CM

## 2020-06-16 DIAGNOSIS — M7662 Achilles tendinitis, left leg: Secondary | ICD-10-CM

## 2020-06-16 DIAGNOSIS — M7661 Achilles tendinitis, right leg: Secondary | ICD-10-CM | POA: Diagnosis not present

## 2020-06-16 DIAGNOSIS — M79671 Pain in right foot: Secondary | ICD-10-CM

## 2020-06-16 MED ORDER — METHYLPREDNISOLONE 4 MG PO TBPK: ORAL_TABLET | ORAL | 0 refills | Status: DC

## 2020-06-16 NOTE — Progress Notes (Signed)
  Subjective:  Patient ID: Renee Howell, female    DOB: [birth year],  MRN: [id number]  Chief Complaint  Patient presents with  . Foot Pain    Bilateral, backs of heels - R worse than L. x1.5-2 months. Previously diagnosed with plantar fasciitis and heel spurs 20-30 yrs ago per pt. 10/10 pain when pt stands after sitting.     54 y.o. female presents with the above complaint. History confirmed with patient.  Both sides are painful.  The right is worse than the left.  Most of the pain is on the bottom of the heel, although occasionally on the back of the heel as well.  Objective:  Physical Exam: warm, good capillary refill, no trophic changes or ulcerative lesions, normal DP and PT pulses and normal sensory exam. Left Foot: point tenderness over the heel pad and tenderness at Achilles tendon insertion, less severe than the right side Right Foot: point tenderness over the heel pad and tenderness at Achilles tendon insertion   Radiographs: X-ray of both feet: plantar calcaneal spur and posterior calcaneal spur Assessment:   1. Plantar fasciitis, bilateral   2. Bilateral foot pain   3. Achilles tendinitis of both lower extremities      Plan:  Patient was evaluated and treated and all questions answered.  Achilles tendinitis -XR reviewed with patient -Educated on stretching and icing of the affected limb. -Rx for Medrol 6-day taper. Advised on risks, benefits, and alternatives of the medication  Plantar fasciitis -XR reviewed with patient -Educated patient on stretching and icing of the affected limb -Plantar fascial brace dispensed -Injection delivered to the plantar fascia of the right foot. -Rx for medrol pack. Educated on use, risks, and benefits of the medication.  I advised her to speak with her PCP and endocrinologist about this in regards to the potential for hyper glycemia and any adjustments need to be made to her monitor and insulin pump -If she is not improving  after next visit we will consider physical therapy  After sterile prep with alcohol, the right heel was injected with 0.5cc 2% xylocaine plain, 0.5cc 0.5% marcaine plain, 5mg  triamcinolone acetonide, and 2mg  dexamethasone was injected along medial the plantar fascia at the insertion on the plantar calcaneus. The patient tolerated the procedure well without complication.  Return in about 4 weeks (around 07/14/2020) for recheck plantar fasciitis, re-check Achilles tendon.   Lanae Crumbly, DPM 06/16/2020

## 2020-07-15 ENCOUNTER — Other Ambulatory Visit (HOSPITAL_COMMUNITY): Payer: Self-pay | Admitting: Internal Medicine

## 2020-07-16 DIAGNOSIS — E119 Type 2 diabetes mellitus without complications: Secondary | ICD-10-CM | POA: Diagnosis not present

## 2020-07-23 ENCOUNTER — Other Ambulatory Visit: Payer: Self-pay

## 2020-07-23 ENCOUNTER — Ambulatory Visit (INDEPENDENT_AMBULATORY_CARE_PROVIDER_SITE_OTHER): Payer: BC Managed Care – PPO | Admitting: Podiatry

## 2020-07-23 DIAGNOSIS — M7662 Achilles tendinitis, left leg: Secondary | ICD-10-CM | POA: Diagnosis not present

## 2020-07-23 DIAGNOSIS — M722 Plantar fascial fibromatosis: Secondary | ICD-10-CM | POA: Diagnosis not present

## 2020-07-23 DIAGNOSIS — M7661 Achilles tendinitis, right leg: Secondary | ICD-10-CM

## 2020-07-23 DIAGNOSIS — M216X1 Other acquired deformities of right foot: Secondary | ICD-10-CM

## 2020-07-23 DIAGNOSIS — M216X2 Other acquired deformities of left foot: Secondary | ICD-10-CM | POA: Diagnosis not present

## 2020-07-23 DIAGNOSIS — M21862 Other specified acquired deformities of left lower leg: Secondary | ICD-10-CM

## 2020-07-23 DIAGNOSIS — M21861 Other specified acquired deformities of right lower leg: Secondary | ICD-10-CM

## 2020-07-23 NOTE — Patient Instructions (Addendum)
Look for an [employer] shoe attachment on [employer] or online or at [place] to level you out with the boot on  Call to schedule PT at:  [facility] [place]  [address] [place], [place] [id number]      Plantar Fasciitis (Heel Spur Syndrome) with Rehab The plantar fascia is a fibrous, ligament-like, soft-tissue structure that spans the bottom of the foot. Plantar fasciitis is a condition that causes pain in the foot due to inflammation of the tissue. SYMPTOMS   Pain and tenderness on the underneath side of the foot.  Pain that worsens with standing or walking. CAUSES  Plantar fasciitis is caused by irritation and injury to the plantar fascia on the underneath side of the foot. Common mechanisms of injury include:  Direct trauma to bottom of the foot.  Damage to a small nerve that runs under the foot where the main fascia attaches to the heel bone.  Stress placed on the plantar fascia due to bone spurs. RISK INCREASES WITH:   Activities that place stress on the plantar fascia (running, jumping, pivoting, or cutting).  Poor strength and flexibility.  Improperly fitted shoes.  Tight calf muscles.  Flat feet.  Failure to warm-up properly before activity.  Obesity. PREVENTION  Warm up and stretch properly before activity.  Allow for adequate recovery between workouts.  Maintain physical fitness:  Strength, flexibility, and endurance.  Cardiovascular fitness.  Maintain a health body weight.  Avoid stress on the plantar fascia.  Wear properly fitted shoes, including arch supports for individuals who have flat feet.  PROGNOSIS  If treated properly, then the symptoms of plantar fasciitis usually resolve without surgery. However, occasionally surgery is necessary.  RELATED COMPLICATIONS   Recurrent symptoms that may result in a chronic condition.  Problems of the lower back that are caused by  compensating for the injury, such as limping.  Pain or weakness of the foot during push-off following surgery.  Chronic inflammation, scarring, and partial or complete fascia tear, occurring more often from repeated injections.  TREATMENT  Treatment initially involves the use of ice and medication to help reduce pain and inflammation. The use of strengthening and stretching exercises may help reduce pain with activity, especially stretches of the Achilles tendon. These exercises may be performed at home or with a therapist. Your caregiver may recommend that you use heel cups of arch supports to help reduce stress on the plantar fascia. Occasionally, corticosteroid injections are given to reduce inflammation. If symptoms persist for greater than 6 months despite non-surgical (conservative), then surgery may be recommended.   MEDICATION   If pain medication is necessary, then nonsteroidal anti-inflammatory medications, such as aspirin and ibuprofen, or other minor pain relievers, such as acetaminophen, are often recommended.  Do not take pain medication within 7 days before surgery.  Prescription pain relievers may be given if deemed necessary by your caregiver. Use only as directed and only as much as you need.  Corticosteroid injections may be given by your caregiver. These injections should be reserved for the most serious cases, because they may only be given a certain number of times.  HEAT AND COLD  Cold treatment (icing) relieves pain and reduces inflammation. Cold treatment should be applied for 10 to 15 minutes every 2 to 3 hours for inflammation and pain and immediately after any activity that aggravates your symptoms. Use ice packs or massage the area with a piece of ice (ice massage).  Heat treatment  may be used prior to performing the stretching and strengthening activities prescribed by your caregiver, physical therapist, or athletic trainer. Use a heat pack or soak the injury in  warm water.  SEEK IMMEDIATE MEDICAL CARE IF:  Treatment seems to offer no benefit, or the condition worsens.  Any medications produce adverse side effects.  EXERCISES- RANGE OF MOTION (ROM) AND STRETCHING EXERCISES - Plantar Fasciitis (Heel Spur Syndrome) These exercises may help you when beginning to rehabilitate your injury. Your symptoms may resolve with or without further involvement from your physician, physical therapist or athletic trainer. While completing these exercises, remember:   Restoring tissue flexibility helps normal motion to return to the joints. This allows healthier, less painful movement and activity.  An effective stretch should be held for at least 30 seconds.  A stretch should never be painful. You should only feel a gentle lengthening or release in the stretched tissue.  RANGE OF MOTION - Toe Extension, Flexion  Sit with your right / left leg crossed over your opposite knee.  Grasp your toes and gently pull them back toward the top of your foot. You should feel a stretch on the bottom of your toes and/or foot.  Hold this stretch for 10 seconds.  Now, gently pull your toes toward the bottom of your foot. You should feel a stretch on the top of your toes and or foot.  Hold this stretch for 10 seconds. Repeat  times. Complete this stretch 3 times per day.   RANGE OF MOTION - Ankle Dorsiflexion, Active Assisted  Remove shoes and sit on a chair that is preferably not on a carpeted surface.  Place right / left foot under knee. Extend your opposite leg for support.  Keeping your heel down, slide your right / left foot back toward the chair until you feel a stretch at your ankle or calf. If you do not feel a stretch, slide your bottom forward to the edge of the chair, while still keeping your heel down.  Hold this stretch for 10 seconds. Repeat 3 times. Complete this stretch 2 times per day.   STRETCH  Gastroc, Standing  Place hands on wall.  Extend  right / left leg, keeping the front knee somewhat bent.  Slightly point your toes inward on your back foot.  Keeping your right / left heel on the floor and your knee straight, shift your weight toward the wall, not allowing your back to arch.  You should feel a gentle stretch in the right / left calf. Hold this position for 10 seconds. Repeat 3 times. Complete this stretch 2 times per day.  STRETCH  Soleus, Standing  Place hands on wall.  Extend right / left leg, keeping the other knee somewhat bent.  Slightly point your toes inward on your back foot.  Keep your right / left heel on the floor, bend your back knee, and slightly shift your weight over the back leg so that you feel a gentle stretch deep in your back calf.  Hold this position for 10 seconds. Repeat 3 times. Complete this stretch 2 times per day.  STRETCH  Gastrocsoleus, Standing  Note: This exercise can place a lot of stress on your foot and ankle. Please complete this exercise only if specifically instructed by your caregiver.   Place the ball of your right / left foot on a step, keeping your other foot firmly on the same step.  Hold on to the wall or a rail for balance.  Slowly lift your other foot, allowing your body weight to press your heel down over the edge of the step.  You should feel a stretch in your right / left calf.  Hold this position for 10 seconds.  Repeat this exercise with a slight bend in your right / left knee. Repeat 3 times. Complete this stretch 2 times per day.   STRENGTHENING EXERCISES - Plantar Fasciitis (Heel Spur Syndrome)  These exercises may help you when beginning to rehabilitate your injury. They may resolve your symptoms with or without further involvement from your physician, physical therapist or athletic trainer. While completing these exercises, remember:   Muscles can gain both the endurance and the strength needed for everyday activities through controlled  exercises.  Complete these exercises as instructed by your physician, physical therapist or athletic trainer. Progress the resistance and repetitions only as guided.  STRENGTH - Towel Curls  Sit in a chair positioned on a non-carpeted surface.  Place your foot on a towel, keeping your heel on the floor.  Pull the towel toward your heel by only curling your toes. Keep your heel on the floor. Repeat 3 times. Complete this exercise 2 times per day.  STRENGTH - Ankle Inversion  Secure one end of a rubber exercise band/tubing to a fixed object (table, pole). Loop the other end around your foot just before your toes.  Place your fists between your knees. This will focus your strengthening at your ankle.  Slowly, pull your big toe up and in, making sure the band/tubing is positioned to resist the entire motion.  Hold this position for 10 seconds.  Have your muscles resist the band/tubing as it slowly pulls your foot back to the starting position. Repeat 3 times. Complete this exercises 2 times per day.  Document Released: 11/14/2005 Document Revised: 02/06/2012 Document Reviewed: 02/26/2009 [facility] Patient Information 2014 [place], [place].

## 2020-07-24 ENCOUNTER — Encounter: Payer: Self-pay | Admitting: Podiatry

## 2020-07-24 NOTE — Progress Notes (Signed)
  Subjective:  Patient ID: Renee Howell, female    DOB: 11-Nov-1966,  MRN: [id number]  Chief Complaint  Patient presents with  . Foot Pain     bil foot pain follow up-Happy Birthday    54 y.o. female presents with the above complaint. History confirmed with patient.  The left has improved quite a bit but the right is still very painful.  She has been wearing the plantar fascial brace. Objective:  Physical Exam: warm, good capillary refill, no trophic changes or ulcerative lesions, normal DP and PT pulses and normal sensory exam. Left Foot: Minimal pain today Right Foot: point tenderness over the heel pad and tenderness at Achilles tendon insertion, severe   Assessment:   1. Plantar fasciitis, bilateral   2. Achilles tendinitis of both lower extremities   3. Gastrocnemius equinus of right lower extremity   4. Gastrocnemius equinus of left lower extremity      Plan:  Patient was evaluated and treated and all questions answered.  Achilles tendinitis -Physical therapy referral placed -CAM boot dispensed -Night splint dispensed  Plantar fasciitis -Today she still having significant pain.  I recommended that she continue to wear the plantar fascial brace.  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  I dispensed a CAM boot to help offload in the event that her pain worsens with increased activity next week. -Night splint was dispensed for treatment of her gastrocnemius equinus contribute to her plantar fasciitis and Achilles tendinitis -Referral to physical therapy placed to work on the plantar fasciitis, gastroc equinus, Achilles tendinitis  Return in about 4 weeks (around 08/20/2020) for recheck plantar fasciitis.   Lanae Crumbly, DPM 07/24/2020

## 2020-08-12 ENCOUNTER — Encounter: Payer: Self-pay | Admitting: Rehabilitative and Restorative Service Providers"

## 2020-08-12 ENCOUNTER — Ambulatory Visit (INDEPENDENT_AMBULATORY_CARE_PROVIDER_SITE_OTHER): Payer: BC Managed Care – PPO | Admitting: Rehabilitative and Restorative Service Providers"

## 2020-08-12 ENCOUNTER — Other Ambulatory Visit: Payer: Self-pay

## 2020-08-12 DIAGNOSIS — R2689 Other abnormalities of gait and mobility: Secondary | ICD-10-CM

## 2020-08-12 DIAGNOSIS — M79671 Pain in right foot: Secondary | ICD-10-CM

## 2020-08-12 DIAGNOSIS — M7662 Achilles tendinitis, left leg: Secondary | ICD-10-CM

## 2020-08-12 DIAGNOSIS — R29898 Other symptoms and signs involving the musculoskeletal system: Secondary | ICD-10-CM

## 2020-08-12 DIAGNOSIS — M79672 Pain in left foot: Secondary | ICD-10-CM

## 2020-08-12 DIAGNOSIS — M7661 Achilles tendinitis, right leg: Secondary | ICD-10-CM | POA: Diagnosis not present

## 2020-08-12 NOTE — Patient Instructions (Addendum)
Kinesiology tape What is kinesiology tape?  There are many brands of kinesiology tape.  KTape, [employer], [employer], Dynamic tape, to name a few. It is an elasticized tape designed to support the bodys natural healing process. This tape provides stability and support to muscles and joints without restricting motion. It can also help decrease swelling in the area of application. How does it work? The tape microscopically lifts and decompresses the skin to allow for drainage of lymph (swelling) to flow away from area, reducing inflammation.  The tape has the ability to help re-educate the neuromuscular system by targeting specific receptors in the skin.  The presence of the tape increases the bodys awareness of posture and body mechanics.  Do not use with:  Open wounds  Skin lesions  Adhesive allergies Safe removal of the tape: In some rare cases, mild/moderate skin irritation can occur.  This can include redness, itchiness, or hives. If this occurs, immediately remove tape and consult your primary care physician if symptoms are severe or do not resolve within 2 days.  To remove tape safely, hold nearby skin with one hand and gentle roll tape down with other hand.  You can apply oil or conditioner to tape while in shower prior to removal to loosen adhesive.  DO NOT swiftly rip tape off like a band-aid, as this could cause skin tears and additional skin irritation.   Access Code: [email or web address]: [email or web address]: 09/15/2021Prepared by: Raymundo Rout HoltExercises  Supine Piriformis Stretch with Leg Straight - 2 x daily - 7 x weekly - 1 sets - 3 reps - 30 sec hold

## 2020-08-12 NOTE — Therapy (Addendum)
[facility] [address] , , [address] Phone: [phone]   Fax:  [phone]  Physical Therapy Evaluation  Patient Details  Name: Renee Howell MRN: [id number] Date of Birth: 09/27/1966 Referring Provider (PT): Dr Adam McDonald    Encounter Date: 08/12/2020   PT End of Session - 08/12/20 1648    Visit Number 1    Number of Visits 12    Date for PT Re-Evaluation 09/23/20    PT Start Time 1600    PT Stop Time 1654    PT Time Calculation (min) 54 min    Activity Tolerance Patient tolerated treatment well           Past Medical History:  Diagnosis Date  . Allergy   . CHF (congestive heart failure) (HCC)   . Diabetes mellitus   . Hypertension   . Kidney infection   . Ovarian cancer (HCC)   . Sleep apnea     Past Surgical History:  Procedure Laterality Date  . ABDOMINAL HYSTERECTOMY    . CESAREAN SECTION    . EYE SURGERY    . RIGHT/LEFT HEART CATH AND CORONARY ANGIOGRAPHY N/A 05/26/2017   Procedure: Right/Left Heart Cath and Coronary Angiography;  Surgeon: Bensimhon, Daniel R, MD;  Location: [facility];  Service: Cardiovascular;  Laterality: N/A;  . SHOULDER OPEN ROTATOR CUFF REPAIR  1988    There were no vitals filed for this visit.    Subjective Assessment - 08/12/20 1611    Subjective Patient reports that she has had pain in thbilat feet since 6/21 with increasing intensity in the past weeks. Patient received injection Rt heel and prednisone dose pac with some relief. She was placed in a boot at night which she can't sleep in but wears some before bed. Boot is irritating hips and LB. The Lt is improving some, more than Lt. She is taking ibuprophen.    Pertinent History history of bilat foot pain ~ 15 yrs ago improved with cortisone injections in both feet improve in 6-12 months; AODM; Lt RCR; hysterectomy    Patient Stated Goals get rid of the pain    Currently in Pain? Yes     Pain Score 10-Worst pain ever    Pain Location Foot    Pain Orientation Right    Pain Descriptors / Indicators Throbbing;Aching;Stabbing    Pain Type Acute pain;Chronic pain    Pain Radiating Towards bottom or feet to the heel into the posterior heel cord    Pain Onset More than a month ago    Pain Frequency Constant    Aggravating Factors  standing; walking; being on her feet; squatting; steps    Pain Relieving Factors ice; OTC meds              [facility] PT Assessment - 08/12/20 0001      Assessment   Medical Diagnosis Bilat plantar fasciitis; heel spurs     Referring Provider (PT) Dr Adam McDonald     Onset Date/Surgical Date 04/28/20    Hand Dominance Right    Next MD Visit 08/27/20    Prior Therapy none       Precautions   Precautions None      Restrictions   Weight Bearing Restrictions No      Balance Screen   Has the patient fallen in the past 6 months No    Has the patient had a decrease in activity level because of a fear of   falling?  No    Is the patient reluctant to leave their home because of a fear of falling?  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 for 20-30 hrs/wk; cares for brother with disability     Leisure household chores; sedentary       Observation/Other Assessments   Focus on Therapeutic Outcomes (FOTO)  72% limitation       Observation/Other Assessments-Edema    Edema --   posterior heels/ankles      Sensation   Additional Comments WFL's per pt report       Posture/Postural Control   Posture Comments flexed forward; LE's in ER; decreased longitudinal and transverse arch  bilat feet      AROM   Right/Left Hip --   end range tightness bilat hips    Right/Left Knee --   WFL's bilat    Right Ankle Dorsiflexion 5    Left Ankle Dorsiflexion 8      Strength   Right Hip Flexion 4-/5    Right Hip Extension 4+/5    Right Hip ABduction 4/5    Left Hip Flexion 4-/5    Left Hip Extension 4-/5    Left Hip ABduction 4/5    Right Knee Flexion 5/5    Right Knee Extension 5/5    Left Knee Flexion 5/5    Left Knee Extension 5/5    Right Ankle Dorsiflexion 4+/5    Left Ankle Dorsiflexion 4+/5      Flexibility   Hamstrings tight Rt > Lt     Quadriceps tight Rt > Lt     ITB tight Rt > Lt     Piriformis tight Rt > Lt       Palpation   Palpation comment tender and tight to palpation through the calcaneous; achilles' tendon through the calf bilat Rt > Lt       Ambulation/Gait   Gait Comments antalgic gait                       Objective measurements completed on examination: See above findings.       [facility] Adult PT Treatment/Exercise - 08/12/20 0001      Self-Care   Self-Care Other Self-Care Comments   avoid walking on standing on toes; support; bracing   Other Self-Care Comments  pt educated on safe removal of ktape and rationale of application       Knee/Hip Exercises: Stretches   Piriformis Stretch Right;Left;2 reps;30 seconds   travell x 1, mod pigeon pose x 1     Manual Therapy   Manual therapy comments I strips of reg Rock tape applied to dorsal surface of bilat feet with 20% stretch, perpendicular strips placed with 50% stretch just distal to calcaneus, I strip placed with 15% stretch across post surface of calcaneus - all to provide support, decompress tissue, and increase proprioception.     Kinesiotex Ligament Correction                  PT Education -   08/12/20 1637    Education Details HEP; POC, ktape info    Person(s) Educated Patient    Methods Explanation;Handout    Comprehension Verbalized understanding                PT Long Term Goals - 08/12/20 1705      PT LONG TERM GOAL #1   Title Decrease pain 50-70% allowing patient to return to more normal functional activities    Time 6    Period Weeks    Status New    Target Date 09/23/20      PT LONG TERM GOAL #2   Title Increase mobility and ROM bilat LE's with patient to demonstrate at least 10-12 deg of ankle DF bilat    Time 6    Period Weeks    Status New    Target Date 09/23/20      PT LONG TERM GOAL #3   Title 5/5 strength bilat hips; 4/5 to 4+/5 strength bilat ankles    Time 6    Period Weeks    Status New    Target Date 09/23/20      PT LONG TERM GOAL #4   Title Independent in HEP    Time 6    Period Weeks    Status New    Target Date 09/23/20      PT LONG TERM GOAL #5   Title Improve FOTO to </= 55% limitation    Time 6    Period Weeks    Status New    Target Date 09/23/20                  Plan - 08/12/20 1648    Clinical Impression Statement Patient presents with 3-4 month history of bilat foot pain Rt > Lt. She has had injection in the Rt foot with some improvement. She has tried multiple braces and supports for feet. She has poor posture and alignment; limited LE mobilty/ROM; LE weakness; pain with palpation through the plantar surface and calaneous Rt > Lt; antalgic gait; pain limiting functional activities. Patient will benefit form PT to address problems identified.    Personal Factors and Comorbidities Behavior Pattern;Comorbidity 1;Comorbidity 2;Comorbidity 3+    Comorbidities sedentary; obesity; AODM; recurrent symptoms; bilat symptoms    Examination-Activity Limitations Locomotion Level;Squat;Stand;Transfers;Stairs    Examination-Participation Restrictions Cleaning;Community Activity;Occupation    Stability/Clinical Decision Making Evolving/Moderate complexity    Clinical Decision Making Moderate    Rehab Potential Good    PT Frequency 2x / week    PT Duration 6 weeks    PT  Treatment/Interventions Patient/family education;ADLs/Self Care Home Management;Aquatic Therapy;Cryotherapy;Electrical Stimulation;Iontophoresis 5m/ml Dexamethasone;Moist Heat;Ultrasound;Gait training;Stair training;Functional mobility training;Therapeutic activities;Therapeutic exercise;Balance training;Neuromuscular re-education;Manual techniques;Dry needling;Taping;Vasopneumatic Device    PT Next Visit Plan assess response to tape bilat feet; continue education re care for plantar fasciitis; routine prior to standing; stretching for bilat hips/calves/feet; progress to WB exercise as tolerated; DN/manual work/modalities as indicated    PT Home Exercise Plan [email or web address]    Consulted and Agree with Plan of Care Patient           Patient will benefit from skilled therapeutic intervention in order to improve the following deficits and impairments:  Abnormal gait, Decreased range of motion, Difficulty walking, Increased fascial restricitons, Obesity, Decreased activity tolerance, Pain, Decreased balance, Impaired flexibility, Improper body mechanics, Decreased mobility, Decreased strength, Increased edema, Postural dysfunction  Visit Diagnosis: Achilles tendonitis, bilateral - Plan: PT plan of care cert/re-cert  Pain in both feet -  Plan: PT plan of care cert/re-cert  Other symptoms and signs involving the musculoskeletal system - Plan: PT plan of care cert/re-cert  Other abnormalities of gait and mobility - Plan: PT plan of care cert/re-cert     Problem List Patient Active Problem List   Diagnosis Date Noted  . Insomnia 06/17/2017  . Obstructive sleep apnea 06/17/2017  . Systolic CHF (HCC) 06/09/2017  . Type 2 diabetes mellitus without complication, with long-term current use of insulin (HCC)   . Hypokalemia 12/08/2016  . Sepsis (HCC) 12/08/2016  . Cellulitis 12/08/2016  . Cellulitis of left upper extremity   . Cellulitis of arm, left 12/07/2016  . HTN (hypertension) 12/07/2016  .  Diabetes (HCC) 12/07/2016  . Sinus tachycardia 12/07/2016    Celyn P Holt  PT MPH  08/12/2020, 5:11 PM  [facility] [address] [place], [place], [address] Phone: [phone]   Fax:  [phone]  Name: Renee Howell MRN: [id number] Date of Birth: 05/05/1966   PHYSICAL THERAPY DISCHARGE SUMMARY  Visits from Start of Care: Evaluation only  Current functional level related to goals / functional outcomes: Unknown    Remaining deficits: Unknown    Education / Equipment: HEP   Plan: Patient agrees to discharge.  Patient goals were not met. Patient is being discharged due to not returning since the last visit.  ?????    Celyn P. Holt PT, MPH 01/14/21 2:03 PM    

## 2020-08-14 ENCOUNTER — Encounter: Payer: BC Managed Care – PPO | Admitting: Rehabilitative and Restorative Service Providers"

## 2020-08-20 DIAGNOSIS — R519 Headache, unspecified: Secondary | ICD-10-CM | POA: Diagnosis not present

## 2020-08-20 DIAGNOSIS — J22 Unspecified acute lower respiratory infection: Secondary | ICD-10-CM | POA: Diagnosis not present

## 2020-08-20 DIAGNOSIS — U071 COVID-19: Secondary | ICD-10-CM | POA: Diagnosis not present

## 2020-08-20 DIAGNOSIS — E119 Type 2 diabetes mellitus without complications: Secondary | ICD-10-CM | POA: Diagnosis not present

## 2020-08-21 ENCOUNTER — Encounter: Payer: BC Managed Care – PPO | Admitting: Rehabilitative and Restorative Service Providers"

## 2020-08-22 ENCOUNTER — Other Ambulatory Visit (HOSPITAL_COMMUNITY): Payer: Self-pay | Admitting: Nurse Practitioner

## 2020-08-22 ENCOUNTER — Ambulatory Visit (HOSPITAL_COMMUNITY)
Admission: RE | Admit: 2020-08-22 | Discharge: 2020-08-22 | Disposition: A | Discharge status: Home / Self Care | Payer: BC Managed Care – PPO | Source: Ambulatory Visit | Attending: Pulmonary Disease | Admitting: Pulmonary Disease

## 2020-08-22 DIAGNOSIS — Z794 Long term (current) use of insulin: Secondary | ICD-10-CM | POA: Insufficient documentation

## 2020-08-22 DIAGNOSIS — E119 Type 2 diabetes mellitus without complications: Secondary | ICD-10-CM | POA: Insufficient documentation

## 2020-08-22 DIAGNOSIS — I5022 Chronic systolic (congestive) heart failure: Secondary | ICD-10-CM

## 2020-08-22 DIAGNOSIS — U071 COVID-19: Secondary | ICD-10-CM | POA: Insufficient documentation

## 2020-08-22 MED ORDER — SODIUM CHLORIDE 0.9 % IV SOLN
1200.0000 mg | Freq: Once | INTRAVENOUS | Status: AC
  Administered 2020-08-22: 1200 mg via INTRAVENOUS

## 2020-08-22 MED ORDER — EPINEPHRINE 0.3 MG/0.3ML IJ SOAJ: 0.3000 mg | Freq: Once | INTRAMUSCULAR | Status: DC | PRN

## 2020-08-22 MED ORDER — FAMOTIDINE IN NACL 20-0.9 MG/50ML-% IV SOLN: 20.0000 mg | Freq: Once | INTRAVENOUS | Status: DC | PRN

## 2020-08-22 MED ORDER — METHYLPREDNISOLONE SODIUM SUCC 125 MG IJ SOLR: 125.0000 mg | Freq: Once | INTRAMUSCULAR | Status: DC | PRN

## 2020-08-22 MED ORDER — SODIUM CHLORIDE 0.9 % IV SOLN: INTRAVENOUS | Status: DC | PRN

## 2020-08-22 MED ORDER — DIPHENHYDRAMINE HCL 50 MG/ML IJ SOLN: 50.0000 mg | Freq: Once | INTRAMUSCULAR | Status: DC | PRN

## 2020-08-22 MED ORDER — ALBUTEROL SULFATE HFA 108 (90 BASE) MCG/ACT IN AERS: 2.0000 | INHALATION_SPRAY | Freq: Once | RESPIRATORY_TRACT | Status: DC | PRN

## 2020-08-22 NOTE — Progress Notes (Signed)
  Diagnosis: COVID-19  Physician: Asencion Noble, MD  Procedure: Covid Infusion Clinic Med: casirivimab\imdevimab infusion - Provided patient with casirivimab\imdevimab fact sheet for patients, parents and caregivers prior to infusion.  Complications: No immediate complications noted.  Discharge: Discharged home   Alena Bills 08/22/2020

## 2020-08-22 NOTE — Progress Notes (Signed)
I connected by phone with Renee Howell on 08/22/2020 at 10:46 AM to discuss the potential use of a new treatment for mild to moderate COVID-19 viral infection in non-hospitalized patients.  This patient is a 54 y.o. female that meets the FDA criteria for Emergency Use Authorization of COVID monoclonal antibody casirivimab/imdevimab or bamlanivimab/eteseviamb.  Has a (+) direct SARS-CoV-2 viral test result  Has mild or moderate COVID-19   Is NOT hospitalized due to COVID-19  Is within 10 days of symptom onset  Has at least one of the high risk factor(s) for progression to severe COVID-19 and/or hospitalization as defined in EUA.  Specific high risk criteria : BMI > 25, Diabetes and Cardiovascular disease or hypertension   I have spoken and communicated the following to the patient or parent/caregiver regarding COVID monoclonal antibody treatment:  1. FDA has authorized the emergency use for the treatment of mild to moderate COVID-19 in adults and pediatric patients with positive results of direct SARS-CoV-2 viral testing who are 32 years of age and older weighing at least 40 kg, and who are at high risk for progressing to severe COVID-19 and/or hospitalization.  2. The significant known and potential risks and benefits of COVID monoclonal antibody, and the extent to which such potential risks and benefits are unknown.  3. Information on available alternative treatments and the risks and benefits of those alternatives, including clinical trials.  4. Patients treated with COVID monoclonal antibody should continue to self-isolate and use infection control measures (e.g., wear mask, isolate, social distance, avoid sharing personal items, clean and disinfect "high touch" surfaces, and frequent handwashing) according to CDC guidelines.   5. The patient or parent/caregiver has the option to accept or refuse COVID monoclonal antibody treatment.  After reviewing this information with the  patient, the patient has agreed to receive one of the available covid 19 monoclonal antibodies and will be provided an appropriate fact sheet prior to infusion. Jobe Gibbon, NP 08/22/2020 10:46 AM

## 2020-08-22 NOTE — Discharge Instructions (Signed)

## 2020-08-26 ENCOUNTER — Encounter: Payer: BC Managed Care – PPO | Admitting: Physical Therapy

## 2020-08-27 ENCOUNTER — Ambulatory Visit: Payer: BC Managed Care – PPO | Admitting: Podiatry

## 2020-09-03 ENCOUNTER — Encounter: Payer: BC Managed Care – PPO | Admitting: Rehabilitative and Restorative Service Providers"

## 2020-09-07 DIAGNOSIS — F419 Anxiety disorder, unspecified: Secondary | ICD-10-CM | POA: Diagnosis not present

## 2020-09-10 ENCOUNTER — Other Ambulatory Visit: Payer: Self-pay

## 2020-09-10 ENCOUNTER — Ambulatory Visit (INDEPENDENT_AMBULATORY_CARE_PROVIDER_SITE_OTHER): Payer: BC Managed Care – PPO | Admitting: Physical Therapy

## 2020-09-10 ENCOUNTER — Encounter: Payer: Self-pay | Admitting: Physical Therapy

## 2020-09-10 DIAGNOSIS — M79671 Pain in right foot: Secondary | ICD-10-CM

## 2020-09-10 DIAGNOSIS — M7661 Achilles tendinitis, right leg: Secondary | ICD-10-CM | POA: Diagnosis not present

## 2020-09-10 DIAGNOSIS — M7662 Achilles tendinitis, left leg: Secondary | ICD-10-CM | POA: Diagnosis not present

## 2020-09-10 DIAGNOSIS — R29898 Other symptoms and signs involving the musculoskeletal system: Secondary | ICD-10-CM | POA: Diagnosis not present

## 2020-09-10 DIAGNOSIS — M79672 Pain in left foot: Secondary | ICD-10-CM

## 2020-09-10 NOTE — Therapy (Signed)
[place], [place], [address] Phone: [phone]   Fax:  [phone]  Physical Therapy Treatment  Patient Details  Name: Renee Howell MRN: [id number] Date of Birth: Dec 27, 1965 Referring Provider (PT): Dr Lanae Crumbly    Encounter Date: 09/10/2020   PT End of Session - 09/10/20 1715    Visit Number 2    Number of Visits 12    Date for PT Re-Evaluation 09/23/20    PT Start Time 1701    PT Stop Time 1745    PT Time Calculation (min) 44 min    Activity Tolerance Patient tolerated treatment well    Behavior During Therapy [facility] for tasks assessed/performed           Past Medical History:  Diagnosis Date  . Allergy   . CHF (congestive heart failure) ([facility])   . Diabetes mellitus   . Hypertension   . Kidney infection   . Ovarian cancer ([place])   . Sleep apnea     Past Surgical History:  Procedure Laterality Date  . ABDOMINAL HYSTERECTOMY    . CESAREAN SECTION    . EYE SURGERY    . RIGHT/LEFT HEART CATH AND CORONARY ANGIOGRAPHY N/A 05/26/2017   Procedure: Right/Left Heart Cath and Coronary Angiography;  Surgeon: Jolaine Artist, MD;  Location: [facility];  Service: Cardiovascular;  Laterality: N/A;  . SHOULDER OPEN ROTATOR CUFF REPAIR  1988    There were no vitals filed for this visit.   Subjective Assessment - 09/10/20 1705    Subjective Pt reports she was in bed for 4 day after contracting COVID.  "They felt good for a while when I was off of them. "  Pain has returned, but not as bad.   Has been pumping ankles in bed and this has helped.    Pertinent History history of bilat foot pain ~ 15 yrs ago improved with cortisone injections in both feet improve in 6-12 months; AODM; Lt RCR; hysterectomy    Patient Stated Goals get rid of the pain    Currently in Pain? Yes    Pain Score 5     Pain Location Foot    Pain Orientation Right;Left    Pain Descriptors / Indicators  Dull;Aching    Aggravating Factors  Stairs, walking    Pain Relieving Factors ice; medication              [facility] PT Assessment - 09/10/20 0001      Assessment   Medical Diagnosis Bilat plantar fasciitis; heel spurs     Referring Provider (PT) Dr Lanae Crumbly     Onset Date/Surgical Date 04/28/20    Hand Dominance Right    Next MD Visit to be scheduled.     Prior Therapy none       AROM   Right Ankle Dorsiflexion 13    Left Ankle Dorsiflexion 12           [facility] Adult PT Treatment/Exercise - 09/10/20 0001      Self-Care   Self-Care Other Self-Care Comments    Other Self-Care Comments  pt educated on self applicaton of ktape to plantar surface of foot for support.  Pt verbalized understanding and returned demo on one foot.       Knee/Hip Exercises: Stretches   Piriformis Stretch Right;Left;20 seconds;3 reps    Gastroc Stretch Right;Left;2 reps;20 seconds    Gastroc Stretch Limitations cues on form  Soleus Stretch Right;Left;2 reps;20 seconds      Knee/Hip Exercises: Aerobic   Nustep L4: 5 min       Knee/Hip Exercises: Standing   Hip Abduction Stengthening;Right;Left;1 set;5 reps   shown as an alternative to sidelying   SLS SLS each leg, 10 sec x 2 reps       Manual Therapy   Manual therapy comments I strips of reg Rock tape applied to dorsal surface of bilat feet with 20% stretch, perpendicular strips placed with 50% stretch just distal to calcaneus, I strip placed with 15% stretch across post surface of calcaneus - all to provide support, decompress tissue, and increase proprioception.                   PT Education - 09/10/20 1752    Education Details ktape application technique, HEP    Person(s) Educated Patient    Methods Explanation;Handout;Demonstration;Verbal cues    Comprehension Verbalized understanding;Returned demonstration               PT Long Term Goals - 09/10/20 1755      PT LONG TERM GOAL #1   Title Decrease pain 50-70%  allowing patient to return to more normal functional activities    Time 6    Period Weeks    Status On-going      PT LONG TERM GOAL #2   Title Increase mobility and ROM bilat LE's with patient to demonstrate at least 10-12 deg of ankle DF bilat    Time 6    Period Weeks    Status Achieved      PT LONG TERM GOAL #3   Title 5/5 strength bilat hips; 4/5 to 4+/5 strength bilat ankles    Time 6    Period Weeks    Status On-going      PT LONG TERM GOAL #4   Title Independent in HEP    Time 6    Period Weeks    Status On-going      PT LONG TERM GOAL #5   Title Improve FOTO to </= 55% limitation    Time 6    Period Weeks    Status On-going                 Plan - 09/10/20 1752    Clinical Impression Statement Pt now reporting reduction of bilat foot pain since short stint of bed rest and daily stretches of calves.  Pt tolerated exercises well, requiring minor cues for technique.  She reported reduction of pain with walking to 8/36 after application of tape. Pt has met LTG#2, other goals are ongoing.    Personal Factors and Comorbidities Behavior Pattern;Comorbidity 1;Comorbidity 2;Comorbidity 3+    Comorbidities sedentary; obesity; AODM; recurrent symptoms; bilat symptoms    Examination-Activity Limitations Locomotion Level;Squat;Stand;Transfers;Stairs    Examination-Participation Restrictions Cleaning;Community Activity;Occupation    Stability/Clinical Decision Making Evolving/Moderate complexity    Rehab Potential Good    PT Frequency 2x / week    PT Duration 6 weeks    PT Treatment/Interventions Patient/family education;ADLs/Self Care Home Management;Aquatic Therapy;Cryotherapy;Electrical Stimulation;Iontophoresis 63m/ml Dexamethasone;Moist Heat;Ultrasound;Gait training;Stair training;Functional mobility training;Therapeutic activities;Therapeutic exercise;Balance training;Neuromuscular re-education;Manual techniques;Dry needling;Taping;Vasopneumatic Device    PT Next Visit  Plan continue education re care for plantar fasciitis; routine prior to standing; stretching for bilat hips/calves/feet; progress to WB exercise as tolerated; DN/manual work/modalities as indicated    PT Home Exercise Plan [email or web address]    Consulted and Agree with Plan of Care Patient  Patient will benefit from skilled therapeutic intervention in order to improve the following deficits and impairments:  Abnormal gait, Decreased range of motion, Difficulty walking, Increased fascial restricitons, Obesity, Decreased activity tolerance, Pain, Decreased balance, Impaired flexibility, Improper body mechanics, Decreased mobility, Decreased strength, Increased edema, Postural dysfunction  Visit Diagnosis: Achilles tendonitis, bilateral  Pain in both feet  Other symptoms and signs involving the musculoskeletal system     Problem List Patient Active Problem List   Diagnosis Date Noted  . Insomnia 06/17/2017  . Obstructive sleep apnea 06/17/2017  . Systolic CHF ([place]) 70/35/0093  . Type 2 diabetes mellitus without complication, with long-term current use of insulin ([place])   . Hypokalemia 12/08/2016  . Sepsis ([place]) 12/08/2016  . Cellulitis 12/08/2016  . Cellulitis of left upper extremity   . Cellulitis of arm, left 12/07/2016  . HTN (hypertension) 12/07/2016  . Diabetes ([place]) 12/07/2016  . Sinus tachycardia 12/07/2016   Kerin Perna, PTA 09/10/20 5:58 PM  [place], [place], [address] Phone: [phone]   Fax:  [phone]  Name: KERY HALTIWANGER MRN: [id number] Date of Birth: August 19, 1966

## 2020-09-10 NOTE — Patient Instructions (Signed)
Access Code: [email or web address]: [email or web address]: 10/14/2021Prepared by: [place]  Supine Piriformis Stretch with Leg Straight - 2 x daily - 7 x weekly - 1 sets - 3 reps - 30 sec hold  Standing Soleus Stretch - 2 x daily - 7 x weekly - 1 sets - 2-3 reps - 20-30 seconds hold  Standing Gastroc Stretch at Counter - 2 x daily - 7 x weekly - 1 sets - 2-3 reps - 20-30 hold  Single Leg Stance - 1 x daily - 7 x weekly - 1 sets - 2-3 reps - 10-30 seconds hold  Sidelying Hip Abduction - 1 x daily - 7 x weekly - 1-2 sets - 10 reps

## 2020-10-15 DIAGNOSIS — E119 Type 2 diabetes mellitus without complications: Secondary | ICD-10-CM | POA: Diagnosis not present

## 2020-10-28 ENCOUNTER — Telehealth (HOSPITAL_COMMUNITY): Payer: Self-pay | Admitting: Pharmacy Technician

## 2020-10-28 NOTE — Telephone Encounter (Signed)
Advanced Heart Failure Patient Advocate Encounter  Prior Authorization for [employer]lan[employer] has been approved.    PA# [id number] Effective dates: 10/28/20 through 10/28/21  Called and updated the patient.  Charlann Boxer, CPhT

## 2020-10-28 NOTE — Telephone Encounter (Signed)
Patient Advocate Encounter   Received notification from [employer] that prior authorization for Corlanor is required.   PA submitted on CoverMyMeds Key [id number] Status is pending   Will continue to follow.

## 2020-12-11 DIAGNOSIS — E119 Type 2 diabetes mellitus without complications: Secondary | ICD-10-CM | POA: Diagnosis not present

## 2020-12-17 DIAGNOSIS — E1165 Type 2 diabetes mellitus with hyperglycemia: Secondary | ICD-10-CM | POA: Diagnosis not present

## 2020-12-17 DIAGNOSIS — F419 Anxiety disorder, unspecified: Secondary | ICD-10-CM | POA: Diagnosis not present

## 2020-12-17 DIAGNOSIS — K219 Gastro-esophageal reflux disease without esophagitis: Secondary | ICD-10-CM | POA: Diagnosis not present

## 2020-12-17 DIAGNOSIS — E785 Hyperlipidemia, unspecified: Secondary | ICD-10-CM | POA: Diagnosis not present

## 2020-12-31 ENCOUNTER — Encounter (HOSPITAL_COMMUNITY): Payer: Self-pay

## 2020-12-31 DIAGNOSIS — G4712 Idiopathic hypersomnia without long sleep time: Secondary | ICD-10-CM | POA: Diagnosis not present

## 2020-12-31 NOTE — Progress Notes (Signed)
Records request from [facility] received. Last OV and labs sent to [phone]

## 2021-01-12 DIAGNOSIS — E7800 Pure hypercholesterolemia, unspecified: Secondary | ICD-10-CM | POA: Diagnosis not present

## 2021-01-12 DIAGNOSIS — E1165 Type 2 diabetes mellitus with hyperglycemia: Secondary | ICD-10-CM | POA: Diagnosis not present

## 2021-01-12 DIAGNOSIS — I10 Essential (primary) hypertension: Secondary | ICD-10-CM | POA: Diagnosis not present

## 2021-01-12 DIAGNOSIS — Z9641 Presence of insulin pump (external) (internal): Secondary | ICD-10-CM | POA: Diagnosis not present

## 2021-01-13 DIAGNOSIS — E7800 Pure hypercholesterolemia, unspecified: Secondary | ICD-10-CM | POA: Diagnosis not present

## 2021-01-13 DIAGNOSIS — E1165 Type 2 diabetes mellitus with hyperglycemia: Secondary | ICD-10-CM | POA: Diagnosis not present

## 2021-01-13 DIAGNOSIS — R635 Abnormal weight gain: Secondary | ICD-10-CM | POA: Diagnosis not present

## 2021-01-25 ENCOUNTER — Other Ambulatory Visit (HOSPITAL_COMMUNITY): Payer: Self-pay | Admitting: Internal Medicine

## 2021-02-10 DIAGNOSIS — H40053 Ocular hypertension, bilateral: Secondary | ICD-10-CM | POA: Diagnosis not present

## 2021-02-10 DIAGNOSIS — E113293 Type 2 diabetes mellitus with mild nonproliferative diabetic retinopathy without macular edema, bilateral: Secondary | ICD-10-CM | POA: Diagnosis not present

## 2021-02-18 ENCOUNTER — Other Ambulatory Visit (HOSPITAL_COMMUNITY): Payer: Self-pay | Admitting: Internal Medicine

## 2021-03-01 ENCOUNTER — Other Ambulatory Visit (HOSPITAL_COMMUNITY): Payer: Self-pay | Admitting: Internal Medicine

## 2021-03-03 ENCOUNTER — Other Ambulatory Visit (HOSPITAL_COMMUNITY): Payer: Self-pay | Admitting: Internal Medicine

## 2021-03-28 ENCOUNTER — Other Ambulatory Visit (HOSPITAL_COMMUNITY): Payer: Self-pay | Admitting: Internal Medicine

## 2021-04-01 DIAGNOSIS — E119 Type 2 diabetes mellitus without complications: Secondary | ICD-10-CM | POA: Diagnosis not present

## 2021-04-21 ENCOUNTER — Other Ambulatory Visit: Payer: Self-pay

## 2021-04-21 ENCOUNTER — Encounter (HOSPITAL_COMMUNITY): Payer: Self-pay | Admitting: Internal Medicine

## 2021-04-21 ENCOUNTER — Ambulatory Visit (HOSPITAL_COMMUNITY)
Admission: RE | Admit: 2021-04-21 | Discharge: 2021-04-21 | Disposition: A | Discharge status: Home / Self Care | Payer: BC Managed Care – PPO | Source: Ambulatory Visit | Attending: Internal Medicine | Admitting: Internal Medicine

## 2021-04-21 VITALS — BP 130/78 | HR 72 | Wt 229.8 lb

## 2021-04-21 DIAGNOSIS — I110 Hypertensive heart disease with heart failure: Secondary | ICD-10-CM | POA: Insufficient documentation

## 2021-04-21 DIAGNOSIS — E119 Type 2 diabetes mellitus without complications: Secondary | ICD-10-CM | POA: Diagnosis not present

## 2021-04-21 DIAGNOSIS — Z7984 Long term (current) use of oral hypoglycemic drugs: Secondary | ICD-10-CM | POA: Diagnosis not present

## 2021-04-21 DIAGNOSIS — E669 Obesity, unspecified: Secondary | ICD-10-CM | POA: Insufficient documentation

## 2021-04-21 DIAGNOSIS — Z794 Long term (current) use of insulin: Secondary | ICD-10-CM | POA: Diagnosis not present

## 2021-04-21 DIAGNOSIS — Z9641 Presence of insulin pump (external) (internal): Secondary | ICD-10-CM | POA: Insufficient documentation

## 2021-04-21 DIAGNOSIS — R000 Tachycardia, unspecified: Secondary | ICD-10-CM | POA: Diagnosis not present

## 2021-04-21 DIAGNOSIS — F988 Other specified behavioral and emotional disorders with onset usually occurring in childhood and adolescence: Secondary | ICD-10-CM | POA: Insufficient documentation

## 2021-04-21 DIAGNOSIS — Z79899 Other long term (current) drug therapy: Secondary | ICD-10-CM | POA: Diagnosis not present

## 2021-04-21 DIAGNOSIS — I5022 Chronic systolic (congestive) heart failure: Secondary | ICD-10-CM | POA: Insufficient documentation

## 2021-04-21 DIAGNOSIS — G4733 Obstructive sleep apnea (adult) (pediatric): Secondary | ICD-10-CM | POA: Insufficient documentation

## 2021-04-21 DIAGNOSIS — F5101 Primary insomnia: Secondary | ICD-10-CM | POA: Diagnosis not present

## 2021-04-21 MED ORDER — FUROSEMIDE 20 MG PO TABS: 20.0000 mg | ORAL_TABLET | Freq: Every day | ORAL | 1 refills | Status: DC | PRN

## 2021-04-21 MED ORDER — CARVEDILOL 6.25 MG PO TABS: 6.2500 mg | ORAL_TABLET | Freq: Two times a day (BID) | ORAL | 3 refills | Status: DC

## 2021-04-21 MED ORDER — ENTRESTO 24-26 MG PO TABS: 1.0000 | ORAL_TABLET | Freq: Two times a day (BID) | ORAL | 3 refills | Status: DC

## 2021-04-21 MED ORDER — ATORVASTATIN CALCIUM 10 MG PO TABS: 1.0000 | ORAL_TABLET | Freq: Every day | ORAL | 3 refills | Status: DC

## 2021-04-21 MED ORDER — SPIRONOLACTONE 25 MG PO TABS: ORAL_TABLET | ORAL | 3 refills | Status: DC

## 2021-04-21 MED ORDER — EMPAGLIFLOZIN 10 MG PO TABS: 10.0000 mg | ORAL_TABLET | Freq: Every day | ORAL | 3 refills | Status: DC

## 2021-04-21 MED ORDER — IVABRADINE HCL 5 MG PO TABS: ORAL_TABLET | ORAL | 3 refills | Status: DC

## 2021-04-21 NOTE — Addendum Note (Signed)
Encounter addended by: Shonna Chock, CMA on: 1/68/3729 3:18 PM  Actions taken: Alternative orders not taken and original order placed, Order list changed, Diagnosis association updated, Clinical Note Signed

## 2021-04-21 NOTE — Progress Notes (Signed)
Advanced Heart Failure Clinic Note   Primary Cardiologist: Dr. Haroldine Laws   HPI: Renee Howell a 55 y.o.female with a h/o obesity, HTN. ADD and poorly-controlled DM2, chronic systolic CHF (EF 63-87%).   Diagnosed in June 2018 with NICM, systolic CHF. Found to have an EF of 15-20%. Was markedly overloaded. Admitted Required milrinone for low output HF. Diuresed over 20 pounds. RHC showed Fick output/index 4.4/2.2 on 0.125 mcg Milrinone. Cardiac MRI without infiltrative disease. Milrinone weaned off. It was felt that her cardiomyopathy was related to either undiagnosed sleep apnea, viral or tachy induced (she has been on stimulants for ADD for a long time). Started on corlanor for tachycardia, although tachycardia likely represented the low output HF as well. Her stimulants were stopped at discharge. Discharge weight was 197 pounds.    Echo today 02/26/20 EF 55-60% Grade I DD. RV normal. Personally reviewed  Here for routine f/u with her husband. Overall doing well. Remains active but limited by pain in R hip and feet. Mild pedal edema. Occasioanl chest tightness with anxiety.     Echo 1/19  EF 50-55% RV normal  Echo (bedside)11/18 30-35% RV ok.    Past Medical History:  Diagnosis Date  . Allergy   . CHF (congestive heart failure) ([place])   . Diabetes mellitus   . Hypertension   . Kidney infection   . Ovarian cancer ([place])   . Sleep apnea     Current Outpatient Medications  Medication Sig Dispense Refill  . acetaminophen (TYLENOL) 325 MG tablet Take 650 mg by mouth every 6 (six) hours as needed.    Marland Kitchen atorvastatin (LIPITOR) 10 MG tablet TAKE 1 TABLET DAILY 90 tablet 3  . calcium citrate-vitamin D (CITRACAL+D) 315-200 MG-UNIT tablet Take 1 tablet by mouth 2 (two) times daily.    . carvedilol (COREG) 6.25 MG tablet TAKE 1 TABLET TWICE A DAY WITH MEALS 180 tablet 3  . cholecalciferol (VITAMIN D) 1000 units tablet Take 2,000 Units by mouth daily.    . CORLANOR 5 MG TABS tablet  TAKE 1 TABLET TWICE A DAY WITH MEALS (NEED FOLLOW UP FOR ADDITIONAL REFILLS) 60 tablet 11  . DULoxetine (CYMBALTA) 30 MG capsule Take 90 mg by mouth daily.    . empagliflozin (JARDIANCE) 10 MG TABS tablet Take 10 mg by mouth daily.     Marland Kitchen ENTRESTO 24-26 MG TAKE 1 TABLET TWICE A DAY 180 tablet 3  . fluticasone (FLONASE) 50 MCG/ACT nasal spray Place 2 sprays into both nostrils daily.    . furosemide (LASIX) 20 MG tablet Take 1 tablet (20 mg total) by mouth daily as needed. Weight gain >3 lb in 1 day or >5 lb in 1 week 30 tablet 3  . Insulin Human (INSULIN PUMP) SOLN Inject into the skin. Sliding scale    . Melatonin 1 MG TABS Take 5 mg by mouth at bedtime as needed.     . metFORMIN (GLUCOPHAGE) 1000 MG tablet Take 1,000 mg by mouth 2 (two) times daily.    . Misc Natural Products (OSTEO BI-FLEX ADV JOINT SHIELD PO) Take by mouth daily.    . montelukast (SINGULAIR) 10 MG tablet Take 10 mg by mouth daily.  0  . pantoprazole (PROTONIX) 40 MG tablet TK 1 T PO QD    . ranitidine (ZANTAC) 150 MG tablet Take 150 mg by mouth daily as needed.    Marland Kitchen spironolactone (ALDACTONE) 25 MG tablet TAKE 1 TABLET DAILY (MUST KEEP APPOINTMENT FOR FURTHER REFILLS) 30 tablet 0  .  zaleplon (SONATA) 5 MG capsule 1-2 caps for sleep at night as needed 30 capsule 2   No current facility-administered medications for this encounter.    Allergies  Allergen Reactions  . Trulicity [Dulaglutide] Other (See Comments)    Pt states that the trulicity makes her feel horrible three days after she uses it. States she will never take it again.   . Latex Rash      Social History   Socioeconomic History  . Marital status: Married    Spouse name: Not on file  . Number of children: 2  . Years of education: Not on file  . Highest education level: Not on file  Occupational History  . Occupation: unemployed  Tobacco Use  . Smoking status: Never Smoker  . Smokeless tobacco: Never Used  Vaping Use  . Vaping Use: Never used   Substance and Sexual Activity  . Alcohol use: Yes    Alcohol/week: 3.0 standard drinks    Types: 2 Glasses of wine, 1 Standard drinks or equivalent per week    Comment: occ  . Drug use: No  . Sexual activity: Not on file  Other Topics Concern  . Not on file  Social History Narrative  . Not on file   Social Determinants of Health   Financial Resource Strain: Not on file  Food Insecurity: Not on file  Transportation Needs: Not on file  Physical Activity: Not on file  Stress: Not on file  Social Connections: Not on file  Intimate Partner Violence: Not on file      Family History  Problem Relation Age of Onset  . Cancer Mother   . Diabetes Father   . Bipolar disorder Brother   . Dementia Brother   . Diabetes Brother     Vitals:   04/21/21 1445  BP: 130/78  Pulse: 72  SpO2: 97%  Weight: 104.2 kg (229 lb 12.8 oz)   Wt Readings from Last 3 Encounters:  04/21/21 104.2 kg (229 lb 12.8 oz)  02/26/20 103.3 kg (227 lb 12.8 oz)  07/31/19 100 kg (220 lb 6.4 oz)    PHYSICAL EXAM: General:  Well appearing. No resp difficulty HEENT: normal Neck: supple. no JVD. Carotids 2+ bilat; no bruits. No lymphadenopathy or thryomegaly appreciated. Cor: PMI nondisplaced. Regular rate & rhythm. No rubs, gallops or murmurs. Lungs: clear Abdomen: obese soft, nontender, nondistended. No hepatosplenomegaly. No bruits or masses. Good bowel sounds. Extremities: no cyanosis, clubbing, rash, edema Neuro: alert & orientedx3, cranial nerves grossly intact. moves all 4 extremities w/o difficulty. Affect pleasant  ECG: NSR 73 No ST-T wave abnormalities.   ASSESSMENT & PLAN:  1. Chronic systolic CHF: Echo EF 40% with biventricular dysfunction. 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 DM. Normal cors by cath in 04/2017. Cardiac MRI without infiltrative disease. 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 RV normal. - Complete recovery with medical therapy - Echo 02/26/20 EF 55-60%  Grade I DD. RV normal.  - Stable NYHA I-II - Volume status looks good - Continue spiro, Entresto and Jardiance. 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  - Will get repeat echo to ensure stability of EF  2. DM - On insulin pump and Dexcom. - Continue Jardiance   3. ADD - No  longer on stimulants. Would avoid in future if possible   5. Mild OSA with idiopathic insomnia -  Follows with Dr. Maxwell Caul. 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   6. Sinus tach - Much improved on corlanor 5 mg BID and carvedilol. Will continue    Glori Bickers, MD 04/21/21

## 2021-04-21 NOTE — Patient Instructions (Addendum)
EKG done today.  No Labs done today.   No medication changes were made. Please continue all current medications as prescribed.  Your physician recommends that you schedule a follow-up appointment in: 1 year. Please contact our office in April 2023 for a May appointment.   If you have any questions or concerns before your next appointment please send [place] a message through [place] or call our office at [phone].    TO LEAVE A MESSAGE FOR THE NURSE SELECT OPTION 2, PLEASE LEAVE A MESSAGE INCLUDING: . YOUR NAME . DATE OF BIRTH . CALL BACK NUMBER . REASON FOR CALL**this is important as we prioritize the call backs  YOU WILL RECEIVE A CALL BACK THE SAME DAY AS LONG AS YOU CALL BEFORE 4:00 PM   Do the following things EVERYDAY: 1) Weigh yourself in the morning before breakfast. Write it down and keep it in a log. 2) Take your medicines as prescribed 3) Eat low salt foods--Limit salt (sodium) to 2000 mg per day.  4) Stay as active as you can everyday 5) Limit all fluids for the day to less than 2 liters   At the [facility], you and your health needs are our priority. As part of our continuing mission to provide you with exceptional heart care, we have created designated Provider Care Teams. These Care Teams include your primary Cardiologist (physician) and Advanced Practice Providers (APPs- Physician Assistants and Nurse Practitioners) who all work together to provide you with the care you need, when you need it.   You may see any of the following providers on your designated Care Team at your next follow up: Marland Kitchen Dr Glori Bickers . Dr Loralie Champagne . Darrick Grinder, NP . Lyda Jester, PA . Audry Riles, PharmD   Please be sure to bring in all your medications bottles to every appointment.

## 2021-04-22 ENCOUNTER — Other Ambulatory Visit: Payer: Self-pay | Admitting: Family Medicine

## 2021-04-22 DIAGNOSIS — Z139 Encounter for screening, unspecified: Secondary | ICD-10-CM

## 2021-04-23 ENCOUNTER — Other Ambulatory Visit: Payer: Self-pay

## 2021-04-23 ENCOUNTER — Ambulatory Visit
Admission: RE | Admit: 2021-04-23 | Discharge: 2021-04-23 | Disposition: A | Discharge status: Home / Self Care | Payer: BC Managed Care – PPO | Source: Ambulatory Visit | Attending: Family Medicine | Admitting: Family Medicine

## 2021-04-23 DIAGNOSIS — Z1231 Encounter for screening mammogram for malignant neoplasm of breast: Secondary | ICD-10-CM | POA: Diagnosis not present

## 2021-04-23 DIAGNOSIS — Z139 Encounter for screening, unspecified: Secondary | ICD-10-CM

## 2021-05-13 ENCOUNTER — Ambulatory Visit: Payer: Self-pay

## 2021-05-13 ENCOUNTER — Other Ambulatory Visit: Payer: Self-pay

## 2021-05-13 ENCOUNTER — Ambulatory Visit (INDEPENDENT_AMBULATORY_CARE_PROVIDER_SITE_OTHER): Payer: BC Managed Care – PPO | Admitting: Orthopaedic Surgery

## 2021-05-13 VITALS — Ht 66.0 in | Wt 229.8 lb

## 2021-05-13 DIAGNOSIS — M79605 Pain in left leg: Secondary | ICD-10-CM

## 2021-05-13 DIAGNOSIS — M25552 Pain in left hip: Secondary | ICD-10-CM

## 2021-05-13 DIAGNOSIS — M5441 Lumbago with sciatica, right side: Secondary | ICD-10-CM

## 2021-05-13 DIAGNOSIS — G8929 Other chronic pain: Secondary | ICD-10-CM

## 2021-05-13 DIAGNOSIS — M79604 Pain in right leg: Secondary | ICD-10-CM

## 2021-05-13 DIAGNOSIS — M25551 Pain in right hip: Secondary | ICD-10-CM | POA: Diagnosis not present

## 2021-05-13 DIAGNOSIS — M4807 Spinal stenosis, lumbosacral region: Secondary | ICD-10-CM

## 2021-05-13 MED ORDER — TIZANIDINE HCL 4 MG PO TABS: 4.0000 mg | ORAL_TABLET | Freq: Three times a day (TID) | ORAL | 0 refills | Status: DC | PRN

## 2021-05-13 NOTE — Progress Notes (Signed)
Office Visit Note   Patient: Renee Howell           Date of Birth: January 21, 1966           MRN: [id number] Visit Date: 05/13/2021              Requested by: Glenford Bayley, DO [place],  [place] [id number] PCP: Glenford Bayley, DO   Assessment & Plan: Visit Diagnoses:  1. Pain in left hip   2. Pain in right hip   3. Bilateral leg pain   4. Chronic right-sided low back pain with right-sided sciatica     Plan: The patient is failed all forms of conservative treatment for this for over a year now.  A MRI of the lumbar spine is warranted to rule out nerve compression that is causing the radicular symptoms that are running down both legs.  She cannot take anti-inflammatories nor steroids at this point.  She has been through therapy and time with having this hurting and getting worse since 2015.  A MRI of the lumbar spine will not help direct the next intervention for her.  I will be sending a muscle relaxant to see if this will help since the pain is getting significant at night.  All questions and concerns were answered addressed.  We will see her in follow-up after the MRI of her lumbar spine.  Follow-Up Instructions: No follow-ups on file.   Orders:  Orders Placed This Encounter  Procedures   XR HIPS BILAT W OR W/O PELVIS 2V   XR Lumbar Spine 2-3 Views   No orders of the defined types were placed in this encounter.     Procedures: No procedures performed   Clinical Data: No additional findings.   Subjective: Chief Complaint  Patient presents with   Left Hip - Pain   Right Hip - Pain  The patient is a very pleasant 55 year old female sent from Dr. Pierre Bali to evaluate and treat bilateral hip pain.  She states this is been going on since 2015 is been slowly getting worse.  She is at the point where she cannot sleep at night due to the pain.  She denies any groin pain and it seems to be more low back and sciatic related.  She has seen a primary care sports  medicine physician before who did provide a steroid injection around her right hip trochanteric area and this did not help her pain at all.  She does have a history of congestive heart failure and she is a diabetic.  Her last hemoglobin A1c was 7.1.  She cannot take steroids at this point and cannot take anti-inflammatories.  She has been through therapy as well.  This is been going on for many years now and is getting significantly worse for her.  She does report radicular symptoms that go down to her feet as well more so on the right than the left.  HPI  Review of Systems There is currently listed no headache, chest pain, shortness of breath, fever, chills, nausea, vomiting  Objective: Vital Signs: Ht 5\' 6"  (1.676 m)   Wt 229 lb 12.8 oz (104.2 kg)   BMI 37.09 kg/m   Physical Exam She is alert and oriented x3 and in no acute distress Ortho Exam Examination of both hips are normal.  She has fluid and full range of motion of both hips with no pain in the groin at all.  There is  no significant pain over the trochanteric area on either hip.  She does have a positive straight leg raise bilaterally.  There are radicular symptoms and signs going down both legs more so in L4 and L5 bilaterally. Specialty Comments:  No specialty comments available.  Imaging: XR HIPS BILAT W OR W/O PELVIS 2V  Result Date: 05/13/2021 An AP pelvis and lateral both hips shows normal-appearing hips with no acute findings.  The hip joints are well located with congruent space.  XR Lumbar Spine 2-3 Views  Result Date: 05/13/2021 An AP and lateral of the lumbar spine shows normal alignment.  There is some degenerative changes at L1 and L2 and some narrowing of the foramina between L5 and S1.    PMFS History: Patient Active Problem List   Diagnosis Date Noted   Insomnia 06/17/2017   Obstructive sleep apnea 44/31/5400   Systolic CHF ([place]) [birth year]/76/1950   Type 2 diabetes mellitus without complication, with long-term  current use of insulin ([facility])    Hypokalemia 12/08/2016   Sepsis ([place]) 12/08/2016   Cellulitis 12/08/2016   Cellulitis of left upper extremity    Cellulitis of arm, left 12/07/2016   HTN (hypertension) 12/07/2016   Diabetes ([place]) 12/07/2016   Sinus tachycardia 12/07/2016   Past Medical History:  Diagnosis Date   Allergy    CHF (congestive heart failure) ([facility])    Diabetes mellitus    Hypertension    Kidney infection    Ovarian cancer ([place])    Sleep apnea     Family History  Problem Relation Age of Onset   Cancer Mother    Diabetes Father    Bipolar disorder Brother    Dementia Brother    Diabetes Brother    Breast cancer Paternal Grandmother     Past Surgical History:  Procedure Laterality Date   ABDOMINAL HYSTERECTOMY     CESAREAN SECTION     EYE SURGERY     RIGHT/LEFT HEART CATH AND CORONARY ANGIOGRAPHY N/A 05/26/2017   Procedure: Right/Left Heart Cath and Coronary Angiography;  Surgeon: Jolaine Artist, MD;  Location: [facility];  Service: Cardiovascular;  Laterality: N/A;   SHOULDER OPEN ROTATOR CUFF REPAIR  1988   Social History   Occupational History   Occupation: unemployed  Tobacco Use   Smoking status: Never   Smokeless tobacco: Never  Vaping Use   Vaping Use: Never used  Substance and Sexual Activity   Alcohol use: Yes    Alcohol/week: 3.0 standard drinks    Types: 2 Glasses of wine, 1 Standard drinks or equivalent per week    Comment: occ   Drug use: No   Sexual activity: Not on file

## 2021-05-26 ENCOUNTER — Telehealth: Payer: Self-pay | Admitting: Orthopaedic Surgery

## 2021-05-26 NOTE — Telephone Encounter (Signed)
Pt called stating she has an MRI on 06/01/21 but she's really claustrophobic and would like to have something called in to help her. Pt would like a CB to let what and when Dr. Ninfa Linden sends something in.   [phone]

## 2021-05-27 ENCOUNTER — Other Ambulatory Visit: Payer: Self-pay | Admitting: Orthopaedic Surgery

## 2021-05-27 MED ORDER — DIAZEPAM 5 MG PO TABS: 5.0000 mg | ORAL_TABLET | Freq: Once | ORAL | 0 refills | Status: AC

## 2021-05-27 NOTE — Telephone Encounter (Signed)
Called and advised pt.

## 2021-05-27 NOTE — Telephone Encounter (Signed)
Please advise 

## 2021-05-28 DIAGNOSIS — E119 Type 2 diabetes mellitus without complications: Secondary | ICD-10-CM | POA: Diagnosis not present

## 2021-06-01 ENCOUNTER — Ambulatory Visit
Admission: RE | Admit: 2021-06-01 | Discharge: 2021-06-01 | Disposition: A | Discharge status: Home / Self Care | Payer: BC Managed Care – PPO | Source: Ambulatory Visit | Attending: Orthopaedic Surgery | Admitting: Orthopaedic Surgery

## 2021-06-01 ENCOUNTER — Other Ambulatory Visit: Payer: Self-pay

## 2021-06-01 DIAGNOSIS — M48061 Spinal stenosis, lumbar region without neurogenic claudication: Secondary | ICD-10-CM | POA: Diagnosis not present

## 2021-06-01 DIAGNOSIS — M545 Low back pain, unspecified: Secondary | ICD-10-CM | POA: Diagnosis not present

## 2021-06-01 DIAGNOSIS — M4807 Spinal stenosis, lumbosacral region: Secondary | ICD-10-CM

## 2021-06-03 DIAGNOSIS — F419 Anxiety disorder, unspecified: Secondary | ICD-10-CM | POA: Diagnosis not present

## 2021-06-07 ENCOUNTER — Ambulatory Visit: Payer: BC Managed Care – PPO | Admitting: Orthopaedic Surgery

## 2021-06-08 DIAGNOSIS — L821 Other seborrheic keratosis: Secondary | ICD-10-CM | POA: Diagnosis not present

## 2021-06-08 DIAGNOSIS — D0322 Melanoma in situ of left ear and external auricular canal: Secondary | ICD-10-CM | POA: Diagnosis not present

## 2021-06-08 DIAGNOSIS — D1801 Hemangioma of skin and subcutaneous tissue: Secondary | ICD-10-CM | POA: Diagnosis not present

## 2021-06-08 DIAGNOSIS — L814 Other melanin hyperpigmentation: Secondary | ICD-10-CM | POA: Diagnosis not present

## 2021-06-08 DIAGNOSIS — D2372 Other benign neoplasm of skin of left lower limb, including hip: Secondary | ICD-10-CM | POA: Diagnosis not present

## 2021-06-09 ENCOUNTER — Ambulatory Visit: Payer: BC Managed Care – PPO | Admitting: Orthopaedic Surgery

## 2021-06-13 ENCOUNTER — Other Ambulatory Visit: Payer: Self-pay | Admitting: Orthopaedic Surgery

## 2021-06-14 NOTE — Telephone Encounter (Signed)
ok 

## 2021-06-21 ENCOUNTER — Encounter: Payer: Self-pay | Admitting: Orthopaedic Surgery

## 2021-06-21 ENCOUNTER — Other Ambulatory Visit: Payer: Self-pay

## 2021-06-21 ENCOUNTER — Ambulatory Visit (INDEPENDENT_AMBULATORY_CARE_PROVIDER_SITE_OTHER): Payer: BC Managed Care – PPO | Admitting: Orthopaedic Surgery

## 2021-06-21 DIAGNOSIS — M5441 Lumbago with sciatica, right side: Secondary | ICD-10-CM | POA: Diagnosis not present

## 2021-06-21 DIAGNOSIS — M4807 Spinal stenosis, lumbosacral region: Secondary | ICD-10-CM

## 2021-06-21 DIAGNOSIS — G8929 Other chronic pain: Secondary | ICD-10-CM | POA: Diagnosis not present

## 2021-06-21 NOTE — Progress Notes (Signed)
The patient comes in today to go over the MRI of her lumbar spine.  She is a 55 year old female with congestive heart failure that is stable.  She cannot take anti-inflammatories and cannot be on regular steroids.  She cannot have steroid injections.  She was sent to me by Dr. Pierre Bali and I examined her and we both felt it was not a hip issue.  Her hips move smoothly and fluidly.  She had had therapy on her hips and had trochanteric injections that did not help.  My exam on her was more consistent with radicular symptoms and low back pain mainly to the right side.  On exam today again her hip exam on both sides is normal.  She does have a positive straight leg raise to the right side but definitely chronic low back pain on both sides and there is a radicular component as well.  The MRI of her lumbar spine does show a right paracentral disc protrusion that is very close and could be contacting the right L5 nerve root.  This certainly can account for her radicular symptoms going down her right leg into her right thigh.  She does have a broad-based disc bulge at L4-L5 and some mild bilateral facet hypertrophy that may be the source of her low back pain going across her lumbar spine.  At this point I would recommend a right-sided epidural injection by Dr. Ernestina Patches at L5-S1 to see if this will address her radicular components of the pain she is having.  She agrees with this treatment plan.  We will work on getting that injection set up.  All questions and concerns were answered and addressed.

## 2021-06-23 ENCOUNTER — Other Ambulatory Visit: Payer: Self-pay

## 2021-06-23 DIAGNOSIS — M5441 Lumbago with sciatica, right side: Secondary | ICD-10-CM

## 2021-06-23 DIAGNOSIS — G8929 Other chronic pain: Secondary | ICD-10-CM

## 2021-06-23 DIAGNOSIS — M4807 Spinal stenosis, lumbosacral region: Secondary | ICD-10-CM

## 2021-06-25 ENCOUNTER — Telehealth: Payer: Self-pay | Admitting: Physical Medicine and Rehabilitation

## 2021-06-25 NOTE — Telephone Encounter (Signed)
Patient called. Returning a call to [place] 

## 2021-06-28 DIAGNOSIS — L988 Other specified disorders of the skin and subcutaneous tissue: Secondary | ICD-10-CM | POA: Diagnosis not present

## 2021-06-28 DIAGNOSIS — D0322 Melanoma in situ of left ear and external auricular canal: Secondary | ICD-10-CM | POA: Diagnosis not present

## 2021-06-29 DIAGNOSIS — D0322 Melanoma in situ of left ear and external auricular canal: Secondary | ICD-10-CM | POA: Diagnosis not present

## 2021-07-06 DIAGNOSIS — E7800 Pure hypercholesterolemia, unspecified: Secondary | ICD-10-CM | POA: Diagnosis not present

## 2021-07-06 DIAGNOSIS — G4712 Idiopathic hypersomnia without long sleep time: Secondary | ICD-10-CM | POA: Diagnosis not present

## 2021-07-06 DIAGNOSIS — I10 Essential (primary) hypertension: Secondary | ICD-10-CM | POA: Diagnosis not present

## 2021-07-06 DIAGNOSIS — Z9641 Presence of insulin pump (external) (internal): Secondary | ICD-10-CM | POA: Diagnosis not present

## 2021-07-06 DIAGNOSIS — E1165 Type 2 diabetes mellitus with hyperglycemia: Secondary | ICD-10-CM | POA: Diagnosis not present

## 2021-07-07 ENCOUNTER — Other Ambulatory Visit: Payer: Self-pay

## 2021-07-07 ENCOUNTER — Encounter: Payer: Self-pay | Admitting: Physical Medicine and Rehabilitation

## 2021-07-07 ENCOUNTER — Ambulatory Visit (INDEPENDENT_AMBULATORY_CARE_PROVIDER_SITE_OTHER): Payer: BC Managed Care – PPO | Admitting: Physical Medicine and Rehabilitation

## 2021-07-07 ENCOUNTER — Ambulatory Visit: Payer: Self-pay

## 2021-07-07 VITALS — BP 114/73 | HR 73

## 2021-07-07 DIAGNOSIS — M5416 Radiculopathy, lumbar region: Secondary | ICD-10-CM

## 2021-07-07 MED ORDER — BETAMETHASONE SOD PHOS & ACET 6 (3-3) MG/ML IJ SUSP
12.0000 mg | Freq: Once | INTRAMUSCULAR | Status: AC
  Administered 2021-07-07: 12 mg

## 2021-07-07 NOTE — Progress Notes (Signed)
Pt state lower back pain that travels to both hips and mostly her right side. Pt state walking, laying and climbing stairs. Pt state she takes pain meds to help ease her pain.   Numeric Pain Rating Scale and Functional Assessment Average Pain 10   In the last MONTH (on 0-10 scale) has pain interfered with the following?  1. General activity like being  able to carry out your everyday physical activities such as walking, climbing stairs, carrying groceries, or moving a chair?  Rating(10)   +Driver, -BT, -Dye Allergies.

## 2021-07-07 NOTE — Progress Notes (Signed)
Renee Howell - 55 y.o. female MRN [id number]  Date of birth: October 03, 1966  Office Visit Note: Visit Date: 07/07/2021 PCP: Glenford Bayley, DO Referred by: Glenford Bayley, DO  Subjective: Chief Complaint  Patient presents with   Lower Back - Pain   Right Hip - Pain   Left Hip - Pain   Right Leg - Pain   HPI:  Renee Howell is a 55 y.o. female who comes in today at the request of Dr. Jean Rosenthal for planned Right L5-S1 Lumbar Interlaminar epidural steroid injection with fluoroscopic guidance.  The patient has failed conservative care including home exercise, medications, time and activity modification.  This injection will be diagnostic and hopefully therapeutic.  Please see requesting physician notes for further details and justification. MRI reviewed with images and spine model.  MRI reviewed in the note below.     ROS Otherwise per HPI.  Assessment & Plan: Visit Diagnoses:    ICD-10-CM   1. Lumbar radiculopathy  M54.16 XR C-ARM NO REPORT    Epidural Steroid injection    betamethasone acetate-betamethasone sodium phosphate (CELESTONE) injection 12 mg      Plan: No additional findings.   Meds & Orders:  Meds ordered this encounter  Medications   betamethasone acetate-betamethasone sodium phosphate (CELESTONE) injection 12 mg    Orders Placed This Encounter  Procedures   XR C-ARM NO REPORT   Epidural Steroid injection    Follow-up: Return if symptoms worsen or fail to improve.   Procedures: No procedures performed  Lumbar Epidural Steroid Injection - Interlaminar Approach with Fluoroscopic Guidance  Patient: Renee Howell      Date of Birth: 1966-04-15 MRN: [id number] PCP: Glenford Bayley, DO      Visit Date: 07/07/2021   Universal Protocol:     Consent Given By: the patient  Position: PRONE  Additional Comments: Vital signs were monitored before and after the procedure. Patient was prepped and draped in the usual sterile fashion. The  correct patient, procedure, and site was verified.   Injection Procedure Details:   Procedure diagnoses: Lumbar radiculopathy [M54.16]   Meds Administered:  Meds ordered this encounter  Medications   betamethasone acetate-betamethasone sodium phosphate (CELESTONE) injection 12 mg     Laterality: Right  Location/Site:  L5-S1  Needle: 3.5 in., 20 ga. Tuohy  Needle Placement: Paramedian epidural  Findings:   -Comments: Excellent flow of contrast into the epidural space.  Procedure Details: Using a paramedian approach from the side mentioned above, the region overlying the inferior lamina was localized under fluoroscopic visualization and the soft tissues overlying this structure were infiltrated with 4 ml. of 1% Lidocaine without Epinephrine. The Tuohy needle was inserted into the epidural space using a paramedian approach.   The epidural space was localized using loss of resistance along with counter oblique bi-planar fluoroscopic views.  After negative aspirate for air, blood, and CSF, a 2 ml. volume of Isovue-250 was injected into the epidural space and the flow of contrast was observed. Radiographs were obtained for documentation purposes.    The injectate was administered into the level noted above.   Additional Comments:  The patient tolerated the procedure well Dressing: 2 x 2 sterile gauze and Band-Aid    Post-procedure details: Patient was observed during the procedure. Post-procedure instructions were reviewed.  Patient left the clinic in stable condition.   Clinical History: MRI LUMBAR SPINE WITHOUT CONTRAST   TECHNIQUE: Multiplanar, multisequence MR imaging of the lumbar spine was performed.  No intravenous contrast was administered.   COMPARISON:  Lumbar radiographs May 13, 2021.   FINDINGS: Segmentation: Standard segmentation. The inferior-most fully formed intervertebral disc is labeled L5-S1.   Alignment: Mild (grade 1) anterolisthesis of L3 on L4.  Otherwise, no substantial sagittal subluxation.   Vertebrae: No specific evidence of acute fracture or discitis/osteomyelitis. Vertebral body heights are maintained. Mild edema in the posterior elements at L4, most likely degenerative/stress related.   Conus medullaris and cauda equina: Conus extends to the L1-L2 level. Conus appears normal.   Paraspinal and other soft tissues: Unremarkable.   Disc levels:   T12-L1: Disc height loss. No significant disc protrusion, foraminal stenosis, or canal stenosis.   L1-L2: Disc height loss. Slight disc bulging without significant canal or foraminal stenosis.   L2-L3: No significant disc protrusion, foraminal stenosis, or canal stenosis.   L3-L4: Mild (grade 1) anterolisthesis of L3 on L4. Uncovering of the disc with mild superimposed broad disc bulge. Mild-to-moderate bilateral facet hypertrophy and ligamentum flavum thickening. Mild canal stenosis and mild left foraminal stenosis. No significant right foraminal stenosis.   L4-L5: Broad disc bulge with mild bilateral facet hypertrophy and ligamentum flavum thickening. No significant canal or foraminal stenosis.   L5-S1: Right paracentral disc protrusion likely contacts the descending right L5 nerve roots (series 3, image 6; series 6, image 37) without evidence of impingement. No significant canal or foraminal stenosis.   IMPRESSION: 1. At L3-L4, grade 1 anterolisthesis with mild canal stenosis and mild left foraminal stenosis. 2. At L5-S1, right paracentral disc protrusion likely contacts the descending right L5 nerve roots without evidence of impingement. No significant canal or foraminal stenosis.     Electronically Signed   By: Margaretha Sheffield MD   On: 06/01/2021 10:14     Objective:  VS:  HT:    WT:   BMI:     BP:114/73  HR:73bpm  TEMP: ( )  RESP:  Physical Exam Vitals and nursing note reviewed.  Constitutional:      General: She is not in acute distress.     Appearance: Normal appearance. She is not ill-appearing.  HENT:     Head: Normocephalic and atraumatic.     Right Ear: External ear normal.     Left Ear: External ear normal.  Eyes:     Extraocular Movements: Extraocular movements intact.  Cardiovascular:     Rate and Rhythm: Normal rate.     Pulses: Normal pulses.  Pulmonary:     Effort: Pulmonary effort is normal. No respiratory distress.  Abdominal:     General: There is no distension.     Palpations: Abdomen is soft.  Musculoskeletal:        General: Tenderness present.     Cervical back: Neck supple.     Right lower leg: No edema.     Left lower leg: No edema.     Comments: Patient has good distal strength with no pain over the greater trochanters.  No clonus or focal weakness.  Skin:    Findings: No erythema, lesion or rash.  Neurological:     General: No focal deficit present.     Mental Status: She is alert and oriented to person, place, and time.     Sensory: No sensory deficit.     Motor: No weakness or abnormal muscle tone.     Coordination: Coordination normal.  Psychiatric:        Mood and Affect: Mood normal.        Behavior:  Behavior normal.     Imaging: No results found.

## 2021-07-07 NOTE — Patient Instructions (Signed)

## 2021-07-07 NOTE — Procedures (Signed)
Lumbar Epidural Steroid Injection - Interlaminar Approach with Fluoroscopic Guidance  Patient: Renee Howell      Date of Birth: [birth year] MRN: [id number] PCP: Glenford Bayley, DO      Visit Date: 07/07/2021   Universal Protocol:     Consent Given By: the patient  Position: PRONE  Additional Comments: Vital signs were monitored before and after the procedure. Patient was prepped and draped in the usual sterile fashion. The correct patient, procedure, and site was verified.   Injection Procedure Details:   Procedure diagnoses: Lumbar radiculopathy [M54.16]   Meds Administered:  Meds ordered this encounter  Medications   betamethasone acetate-betamethasone sodium phosphate (CELESTONE) injection 12 mg     Laterality: Right  Location/Site:  L5-S1  Needle: 3.5 in., 20 ga. Tuohy  Needle Placement: Paramedian epidural  Findings:   -Comments: Excellent flow of contrast into the epidural space.  Procedure Details: Using a paramedian approach from the side mentioned above, the region overlying the inferior lamina was localized under fluoroscopic visualization and the soft tissues overlying this structure were infiltrated with 4 ml. of 1% Lidocaine without Epinephrine. The Tuohy needle was inserted into the epidural space using a paramedian approach.   The epidural space was localized using loss of resistance along with counter oblique bi-planar fluoroscopic views.  After negative aspirate for air, blood, and CSF, a 2 ml. volume of Isovue-250 was injected into the epidural space and the flow of contrast was observed. Radiographs were obtained for documentation purposes.    The injectate was administered into the level noted above.   Additional Comments:  The patient tolerated the procedure well Dressing: 2 x 2 sterile gauze and Band-Aid    Post-procedure details: Patient was observed during the procedure. Post-procedure instructions were reviewed.  Patient left the  clinic in stable condition.

## 2021-07-21 DIAGNOSIS — D0322 Melanoma in situ of left ear and external auricular canal: Secondary | ICD-10-CM | POA: Diagnosis not present

## 2021-07-30 ENCOUNTER — Telehealth: Payer: Self-pay | Admitting: Physical Medicine and Rehabilitation

## 2021-07-30 DIAGNOSIS — M4807 Spinal stenosis, lumbosacral region: Secondary | ICD-10-CM

## 2021-07-30 NOTE — Telephone Encounter (Signed)
Patient called. Says she is still pain. Would like someone to call her. Her number is [phone]

## 2021-08-03 ENCOUNTER — Telehealth: Payer: Self-pay | Admitting: Physical Medicine and Rehabilitation

## 2021-08-03 NOTE — Telephone Encounter (Signed)
Pt returned call from [place]. Please call pt at [phone].

## 2021-08-04 ENCOUNTER — Telehealth: Payer: Self-pay | Admitting: Physical Medicine and Rehabilitation

## 2021-08-04 NOTE — Telephone Encounter (Signed)
See previous message

## 2021-08-04 NOTE — Addendum Note (Signed)
Addended by: Sherre Scarlet B on: 08/04/2021 02:34 PM   Modules accepted: Orders

## 2021-08-04 NOTE — Telephone Encounter (Signed)
Right L5-S1 IL on 8/10. Patient reports relief of leg pain but she continues to have severe low back pain. Please advise.

## 2021-08-04 NOTE — Telephone Encounter (Signed)
Patient returned call. I called her back and left message #2 to ask if she had any relief at all following injection.

## 2021-08-04 NOTE — Telephone Encounter (Signed)
Pt called returning Courtneys call and states she will be free around 12:30, so if courtney could her try again then she'd appreciate it.   [phone]

## 2021-08-04 NOTE — Telephone Encounter (Signed)
Referral placed. Patient aware we will call to schedule appointment.

## 2021-08-06 ENCOUNTER — Telehealth: Payer: Self-pay | Admitting: Physical Medicine and Rehabilitation

## 2021-08-06 NOTE — Telephone Encounter (Signed)
Pt called stating she missed a call to set an appt with Dr.,Newton. Pt would like a CB please   [phone]

## 2021-08-11 ENCOUNTER — Ambulatory Visit (INDEPENDENT_AMBULATORY_CARE_PROVIDER_SITE_OTHER): Payer: BC Managed Care – PPO | Admitting: Physical Medicine and Rehabilitation

## 2021-08-11 ENCOUNTER — Encounter: Payer: Self-pay | Admitting: Physical Medicine and Rehabilitation

## 2021-08-11 ENCOUNTER — Other Ambulatory Visit: Payer: Self-pay

## 2021-08-11 VITALS — BP 120/76 | HR 80

## 2021-08-11 DIAGNOSIS — G8929 Other chronic pain: Secondary | ICD-10-CM

## 2021-08-11 DIAGNOSIS — M47816 Spondylosis without myelopathy or radiculopathy, lumbar region: Secondary | ICD-10-CM

## 2021-08-11 DIAGNOSIS — M5136 Other intervertebral disc degeneration, lumbar region: Secondary | ICD-10-CM

## 2021-08-11 DIAGNOSIS — M4316 Spondylolisthesis, lumbar region: Secondary | ICD-10-CM | POA: Diagnosis not present

## 2021-08-11 DIAGNOSIS — M47819 Spondylosis without myelopathy or radiculopathy, site unspecified: Secondary | ICD-10-CM | POA: Diagnosis not present

## 2021-08-11 DIAGNOSIS — M545 Low back pain, unspecified: Secondary | ICD-10-CM

## 2021-08-11 NOTE — Progress Notes (Signed)
Pt state lower back pain that travels to both hips. Pt state climbing stairs, standing or sitting a long time makes the pain worse. Pt statebending cause pain as well.Pt state she has trouble washes dish and vacuum her floors. Pt state she takes pain meds ease her pain. Pt state has hx of inj on 07/07/21 pt state it helped.  Numeric Pain Rating Scale and Functional Assessment Average Pain 10 Pain Right Now 10 My pain is constant, sharp, burning, tingling, and aching Pain is worse with: bending, sitting, standing, and some activites Pain improves with: medication   In the last MONTH (on 0-10 scale) has pain interfered with the following?  1. General activity like being  able to carry out your everyday physical activities such as walking, climbing stairs, carrying groceries, or moving a chair?  Rating(7)  2. Relation with others like being able to carry out your usual social activities and roles such as  activities at home, at work and in your community. Rating(8)  3. Enjoyment of life such that you have  been bothered by emotional problems such as feeling anxious, depressed or irritable?  Rating(9)

## 2021-08-11 NOTE — Progress Notes (Signed)
[name] - 55 y.o. female MRN [id number]  Date of birth: 1966-09-02  Office Visit Note: Visit Date: 08/11/2021 PCP: Glenford Bayley, DO Referred by: Glenford Bayley, DO  Subjective: Chief Complaint  Patient presents with   Lower Back - Pain   Right Hip - Pain   Left Hip - Pain   HPI: Renee Howell is a 55 y.o. female who comes in today for evaluation of chronic, worsening and severe bilateral lower back pain radiating to buttocks and down right lateral leg to ankle. Patient had right L5-S1 interlaminar epidural steroid injection in August which she reports resolved radicular symptoms and continues to sustain. Patient reports bilateral lower back pain continues and is worsened by walking, activity and motion of bending down and standing back up. Patient describes pain as soreness sensation and currently rates as 9 out of 10. Patient reports some pain relief with use of Tylenol. Patient's recent lumbar MRI exhibits multi-level facet hypertrophy, right paracentral disc protrusion likely contacts the descending right L5 nerve roots, no high grade canal stenosis noted. Patient states that she is a Pharmacist, hospital and notices that the severe pain is preventing her from performing her job appropriately. Patient was previously seen by Dr. Jean Rosenthal for bilateral hip pain, patient had normal appearing bilateral x-ray with no acute findings.  Patient denies focal weakness, numbness and tingling. Patient denies recent trauma or falls.   Review of Systems  Musculoskeletal:  Positive for back pain.  Neurological:  Negative for tingling, sensory change, focal weakness and weakness.  All other systems reviewed and are negative. Otherwise per HPI.  Assessment & Plan: Visit Diagnoses:    ICD-10-CM   1. Spondylosis without myelopathy or radiculopathy  M47.819 Ambulatory referral to Physical Medicine Rehab    2. Chronic bilateral low back pain without sciatica  M54.50    G89.29     3. Facet  hypertrophy of lumbar region  M47.816     4. Anterolisthesis of lumbar spine  M43.16     5. Other intervertebral disc degeneration, lumbar region  M51.36        Plan: Findings:  Chronic, worsening and severe bilateral axial back pain. Patient's right sided radicular symptoms have resolved and continue to sustain after recent right L5-S1 interlaminar epidural steroid injection. Patient continues to have excruciating bilateral lower back pain that is negatively impacting her ability to perform daily tasks and causing her functional issues at work. We believe the next step is to perform a diagnostic and hopefully therapeutic bilateral L3-L4 facet joint/medial branch blocks under fluoroscopic guidance. If patient does get significant pain relief form these facet joint/medial branch blocks we will consider radiofrequency ablation. We discussed radiofrequency ablation with patient and her husband today. She was also given educational material regarding this procedure to take home and read. No red flag symptoms noted upon exam today.   Current medication management is not beneficial in increasing their functional status.  Please note that procedures are done as part of a comprehensive orthopedic and pain management program with access to in-house orthopedics, spine surgery and physical therapy as well as access to [facility] biopsychosocial counseling if needed.     Meds & Orders: No orders of the defined types were placed in this encounter.   Orders Placed This Encounter  Procedures   Ambulatory referral to Physical Medicine Rehab    Follow-up: Return in about 1 week (around 08/18/2021) for Bilateral L3-L4 facet joint injections.   Procedures:  No procedures performed      Clinical History: MRI LUMBAR SPINE WITHOUT CONTRAST   TECHNIQUE: Multiplanar, multisequence MR imaging of the lumbar spine was performed. No intravenous contrast was administered.   COMPARISON:  Lumbar  radiographs May 13, 2021.   FINDINGS: Segmentation: Standard segmentation. The inferior-most fully formed intervertebral disc is labeled L5-S1.   Alignment: Mild (grade 1) anterolisthesis of L3 on L4. Otherwise, no substantial sagittal subluxation.   Vertebrae: No specific evidence of acute fracture or discitis/osteomyelitis. Vertebral body heights are maintained. Mild edema in the posterior elements at L4, most likely degenerative/stress related.   Conus medullaris and cauda equina: Conus extends to the L1-L2 level. Conus appears normal.   Paraspinal and other soft tissues: Unremarkable.   Disc levels:   T12-L1: Disc height loss. No significant disc protrusion, foraminal stenosis, or canal stenosis.   L1-L2: Disc height loss. Slight disc bulging without significant canal or foraminal stenosis.   L2-L3: No significant disc protrusion, foraminal stenosis, or canal stenosis.   L3-L4: Mild (grade 1) anterolisthesis of L3 on L4. Uncovering of the disc with mild superimposed broad disc bulge. Mild-to-moderate bilateral facet hypertrophy and ligamentum flavum thickening. Mild canal stenosis and mild left foraminal stenosis. No significant right foraminal stenosis.   L4-L5: Broad disc bulge with mild bilateral facet hypertrophy and ligamentum flavum thickening. No significant canal or foraminal stenosis.   L5-S1: Right paracentral disc protrusion likely contacts the descending right L5 nerve roots (series 3, image 6; series 6, image 37) without evidence of impingement. No significant canal or foraminal stenosis.   IMPRESSION: 1. At L3-L4, grade 1 anterolisthesis with mild canal stenosis and mild left foraminal stenosis. 2. At L5-S1, right paracentral disc protrusion likely contacts the descending right L5 nerve roots without evidence of impingement. No significant canal or foraminal stenosis.     Electronically Signed   By: Margaretha Sheffield MD   On: 06/01/2021 10:14    She reports that she has never smoked. She has never used smokeless tobacco. No results for input(s): HGBA1C, LABURIC in the last 8760 hours.  Objective:  VS:  HT:    WT:   BMI:     BP:120/76  HR:80bpm  TEMP: ( )  RESP:  Physical Exam HENT:     Head: Normocephalic and atraumatic.     Right Ear: Tympanic membrane normal.     Left Ear: Tympanic membrane normal.     Nose: Nose normal.     Mouth/Throat:     Mouth: Mucous membranes are moist.  Eyes:     Pupils: Pupils are equal, round, and reactive to light.  Cardiovascular:     Rate and Rhythm: Normal rate.     Pulses: Normal pulses.  Pulmonary:     Effort: Pulmonary effort is normal.  Abdominal:     General: Abdomen is flat. There is no distension.  Musculoskeletal:        General: Tenderness present.     Cervical back: Normal range of motion.     Comments: Pt is slow to rise from seated position to standing. Pain noted upon facet loading. Strong distal strength without clonus, no pain upon palpation of greater trochanters. Sensation intact bilaterally. Pain noted upon palpation of underlying musculature to bilateral paraspinal region. Walks independently, gait steady.    Skin:    General: Skin is warm and dry.     Capillary Refill: Capillary refill takes less than 2 seconds.  Neurological:     General: No focal deficit present.  Mental Status: She is alert.  Psychiatric:        Mood and Affect: Mood normal.    Ortho Exam  Imaging: No results found.  Past Medical/Family/Surgical/Social History: Medications & Allergies reviewed per EMR, new medications updated. Patient Active Problem List   Diagnosis Date Noted   Insomnia 06/17/2017   Obstructive sleep apnea [id number]   Systolic CHF ([place]) 0000000   Type 2 diabetes mellitus without complication, with long-term current use of insulin (HCC)    Hypokalemia 12/08/2016   Sepsis ([place]) 12/08/2016   Cellulitis 12/08/2016   Cellulitis of left upper extremity     Cellulitis of arm, left 12/07/2016   HTN (hypertension) 12/07/2016   Diabetes ([place]) 12/07/2016   Sinus tachycardia 12/07/2016   Past Medical History:  Diagnosis Date   Allergy    CHF (congestive heart failure) (HCC)    Diabetes mellitus    Hypertension    Kidney infection    Ovarian cancer ([place])    Sleep apnea    Family History  Problem Relation Age of Onset   Cancer Mother    Diabetes Father    Bipolar disorder Brother    Dementia Brother    Diabetes Brother    Breast cancer Paternal Grandmother    Past Surgical History:  Procedure Laterality Date   ABDOMINAL HYSTERECTOMY     CESAREAN SECTION     EYE SURGERY     RIGHT/LEFT HEART CATH AND CORONARY ANGIOGRAPHY N/A 05/26/2017   Procedure: Right/Left Heart Cath and Coronary Angiography;  Surgeon: Jolaine Artist, MD;  Location: [facility];  Service: Cardiovascular;  Laterality: N/A;   SHOULDER OPEN ROTATOR CUFF REPAIR  1988   Social History   Occupational History   Occupation: unemployed  Tobacco Use   Smoking status: Never   Smokeless tobacco: Never  Vaping Use   Vaping Use: Never used  Substance and Sexual Activity   Alcohol use: Yes    Alcohol/week: 3.0 standard drinks    Types: 2 Glasses of wine, 1 Standard drinks or equivalent per week    Comment: occ   Drug use: No   Sexual activity: Not on file

## 2021-08-18 DIAGNOSIS — H40021 Open angle with borderline findings, high risk, right eye: Secondary | ICD-10-CM | POA: Diagnosis not present

## 2021-08-18 DIAGNOSIS — H40012 Open angle with borderline findings, low risk, left eye: Secondary | ICD-10-CM | POA: Diagnosis not present

## 2021-08-18 DIAGNOSIS — H02421 Myogenic ptosis of right eyelid: Secondary | ICD-10-CM | POA: Diagnosis not present

## 2021-08-18 DIAGNOSIS — H02834 Dermatochalasis of left upper eyelid: Secondary | ICD-10-CM | POA: Diagnosis not present

## 2021-08-18 DIAGNOSIS — E113293 Type 2 diabetes mellitus with mild nonproliferative diabetic retinopathy without macular edema, bilateral: Secondary | ICD-10-CM | POA: Diagnosis not present

## 2021-08-19 ENCOUNTER — Encounter: Payer: Self-pay | Admitting: Physical Medicine and Rehabilitation

## 2021-08-19 ENCOUNTER — Ambulatory Visit (INDEPENDENT_AMBULATORY_CARE_PROVIDER_SITE_OTHER): Payer: BC Managed Care – PPO | Admitting: Physical Medicine and Rehabilitation

## 2021-08-19 ENCOUNTER — Ambulatory Visit: Payer: Self-pay

## 2021-08-19 VITALS — BP 118/84 | HR 80

## 2021-08-19 DIAGNOSIS — M47819 Spondylosis without myelopathy or radiculopathy, site unspecified: Secondary | ICD-10-CM

## 2021-08-19 MED ORDER — BETAMETHASONE SOD PHOS & ACET 6 (3-3) MG/ML IJ SUSP
12.0000 mg | Freq: Once | INTRAMUSCULAR | Status: AC
  Administered 2021-08-19: 12 mg

## 2021-08-19 NOTE — Progress Notes (Signed)
Pt state lower back pain that travels to both hips. Pt state climbing stairs, standing or sitting a long time makes the pain worse. Pt state she takes pajn meds to help ease her pain.  Numeric Pain Rating Scale and Functional Assessment Average Pain 10   In the last MONTH (on 0-10 scale) has pain interfered with the following?  1. General activity like being  able to carry out your everyday physical activities such as walking, climbing stairs, carrying groceries, or moving a chair?  Rating(10)   +Driver, -BT, -Dye Allergies.

## 2021-08-19 NOTE — Patient Instructions (Signed)

## 2021-08-23 ENCOUNTER — Encounter: Payer: Self-pay | Admitting: Physical Medicine and Rehabilitation

## 2021-08-23 DIAGNOSIS — M545 Low back pain, unspecified: Secondary | ICD-10-CM

## 2021-08-23 DIAGNOSIS — M47819 Spondylosis without myelopathy or radiculopathy, site unspecified: Secondary | ICD-10-CM

## 2021-08-23 DIAGNOSIS — G8929 Other chronic pain: Secondary | ICD-10-CM

## 2021-08-24 DIAGNOSIS — E119 Type 2 diabetes mellitus without complications: Secondary | ICD-10-CM | POA: Diagnosis not present

## 2021-08-25 ENCOUNTER — Other Ambulatory Visit: Payer: Self-pay

## 2021-08-25 ENCOUNTER — Ambulatory Visit (HOSPITAL_COMMUNITY)
Admission: RE | Admit: 2021-08-25 | Discharge: 2021-08-25 | Disposition: A | Discharge status: Home / Self Care | Payer: BC Managed Care – PPO | Source: Ambulatory Visit | Attending: Internal Medicine | Admitting: Internal Medicine

## 2021-08-25 DIAGNOSIS — E119 Type 2 diabetes mellitus without complications: Secondary | ICD-10-CM | POA: Insufficient documentation

## 2021-08-25 DIAGNOSIS — I5022 Chronic systolic (congestive) heart failure: Secondary | ICD-10-CM | POA: Insufficient documentation

## 2021-08-25 LAB — ECHOCARDIOGRAM COMPLETE
AR max vel: 2.33 cm2
AV Area VTI: 2.34 cm2
AV Area mean vel: 2.28 cm2
AV Mean grad: 4 mmHg
AV Peak grad: 7.8 mmHg
Ao pk vel: 1.4 m/s
Area-P 1/2: 2.76 cm2
Calc EF: 61.7 %
S' Lateral: 2.8 cm
Single Plane A2C EF: 65.6 %
Single Plane A4C EF: 56.6 %

## 2021-08-25 NOTE — Progress Notes (Signed)
  Echocardiogram 2D Echocardiogram has been performed.  Renee Howell F 08/25/2021, 4:05 PM

## 2021-08-27 ENCOUNTER — Encounter: Payer: Self-pay | Admitting: Physical Medicine and Rehabilitation

## 2021-08-27 NOTE — Procedures (Signed)
Lumbar Diagnostic Facet Joint Nerve Block with Fluoroscopic Guidance   Patient: Renee Howell      Date of Birth: 1966/01/17 MRN: [id number] PCP: Glenford Bayley, DO      Visit Date: 08/19/2021   Universal Protocol:    Date/Time: 09/30/225:17 PM  Consent Given By: the patient  Position: PRONE  Additional Comments: Vital signs were monitored before and after the procedure. Patient was prepped and draped in the usual sterile fashion. The correct patient, procedure, and site was verified.   Injection Procedure Details:   Procedure diagnoses:  1. Spondylosis without myelopathy or radiculopathy      Meds Administered:  Meds ordered this encounter  Medications   betamethasone acetate-betamethasone sodium phosphate (CELESTONE) injection 12 mg     Laterality: Bilateral  Location/Site: L3-L4, L2 and L3 medial branches  Needle: 5.0 in., 25 ga.  Short bevel or Quincke spinal needle  Needle Placement: Oblique pedical  Findings:   -Comments: There was excellent flow of contrast along the articular pillars without intravascular flow.  Procedure Details: The fluoroscope beam is vertically oriented in AP and then obliqued 15 to 20 degrees to the ipsilateral side of the desired nerve to achieve the "Scotty dog" appearance.  The skin over the target area of the junction of the superior articulating process and the transverse process (sacral ala if blocking the L5 dorsal rami) was locally anesthetized with a 1 ml volume of 1% Lidocaine without Epinephrine.  The spinal needle was inserted and advanced in a trajectory view down to the target.   After contact with periosteum and negative aspirate for blood and CSF, correct placement without intravascular or epidural spread was confirmed by injecting 0.5 ml. of Isovue-250.  A spot radiograph was obtained of this image.    Next, a 0.5 ml. volume of the injectate described above was injected. The needle was then redirected to the other facet  joint nerves mentioned above if needed.  Prior to the procedure, the patient was given a Pain Diary which was completed for baseline measurements.  After the procedure, the patient rated their pain every 30 minutes and will continue rating at this frequency for a total of 5 hours.  The patient has been asked to complete the Diary and return to [place] by mail, fax or hand delivered as soon as possible.   Additional Comments:  The patient tolerated the procedure well Dressing: 2 x 2 sterile gauze and Band-Aid    Post-procedure details: Patient was observed during the procedure. Post-procedure instructions were reviewed.  Patient left the clinic in stable condition.

## 2021-08-27 NOTE — Progress Notes (Signed)
Renee Howell - 55 y.o. female MRN [id number]  Date of birth: [birth year]  Office Visit Note: Visit Date: 08/19/2021 PCP: Glenford Bayley, DO Referred by: Glenford Bayley, DO  Subjective: Chief Complaint  Patient presents with   Lower Back - Pain   Right Hip - Pain   Left Hip - Pain   HPI:  Renee Howell is a 55 y.o. female who comes in today at the request of Barnet Pall, FNP for planned Bilateral  L3-4 Lumbar facet/medial branch block with fluoroscopic guidance.  The patient has failed conservative care including home exercise, medications, time and activity modification.  This injection will be diagnostic and hopefully therapeutic.  Please see requesting physician notes for further details and justification.  Exam has shown concordant pain with facet joint loading.   ROS Otherwise per HPI.  Assessment & Plan: Visit Diagnoses:    ICD-10-CM   1. Spondylosis without myelopathy or radiculopathy  M47.819 XR C-ARM NO REPORT    Facet Injection    betamethasone acetate-betamethasone sodium phosphate (CELESTONE) injection 12 mg      Plan: No additional findings.   Meds & Orders:  Meds ordered this encounter  Medications   betamethasone acetate-betamethasone sodium phosphate (CELESTONE) injection 12 mg    Orders Placed This Encounter  Procedures   Facet Injection   XR C-ARM NO REPORT    Follow-up: Return for Review Pain Diary.   Procedures: No procedures performed  Lumbar Diagnostic Facet Joint Nerve Block with Fluoroscopic Guidance   Patient: Renee Howell      Date of Birth: 06/04/1966 MRN: [id number] PCP: Glenford Bayley, DO      Visit Date: 08/19/2021   Universal Protocol:    Date/Time: 09/30/225:17 PM  Consent Given By: the patient  Position: PRONE  Additional Comments: Vital signs were monitored before and after the procedure. Patient was prepped and draped in the usual sterile fashion. The correct patient, procedure, and site was  verified.   Injection Procedure Details:   Procedure diagnoses:  1. Spondylosis without myelopathy or radiculopathy      Meds Administered:  Meds ordered this encounter  Medications   betamethasone acetate-betamethasone sodium phosphate (CELESTONE) injection 12 mg     Laterality: Bilateral  Location/Site: L3-L4, L2 and L3 medial branches  Needle: 5.0 in., 25 ga.  Short bevel or Quincke spinal needle  Needle Placement: Oblique pedical  Findings:   -Comments: There was excellent flow of contrast along the articular pillars without intravascular flow.  Procedure Details: The fluoroscope beam is vertically oriented in AP and then obliqued 15 to 20 degrees to the ipsilateral side of the desired nerve to achieve the "Scotty dog" appearance.  The skin over the target area of the junction of the superior articulating process and the transverse process (sacral ala if blocking the L5 dorsal rami) was locally anesthetized with a 1 ml volume of 1% Lidocaine without Epinephrine.  The spinal needle was inserted and advanced in a trajectory view down to the target.   After contact with periosteum and negative aspirate for blood and CSF, correct placement without intravascular or epidural spread was confirmed by injecting 0.5 ml. of Isovue-250.  A spot radiograph was obtained of this image.    Next, a 0.5 ml. volume of the injectate described above was injected. The needle was then redirected to the other facet joint nerves mentioned above if needed.  Prior to the procedure, the patient was given a Pain Diary which was completed  for baseline measurements.  After the procedure, the patient rated their pain every 30 minutes and will continue rating at this frequency for a total of 5 hours.  The patient has been asked to complete the Diary and return to [place] by mail, fax or hand delivered as soon as possible.   Additional Comments:  The patient tolerated the procedure well Dressing: 2 x 2 sterile  gauze and Band-Aid    Post-procedure details: Patient was observed during the procedure. Post-procedure instructions were reviewed.  Patient left the clinic in stable condition.    Clinical History: MRI LUMBAR SPINE WITHOUT CONTRAST   TECHNIQUE: Multiplanar, multisequence MR imaging of the lumbar spine was performed. No intravenous contrast was administered.   COMPARISON:  Lumbar radiographs May 13, 2021.   FINDINGS: Segmentation: Standard segmentation. The inferior-most fully formed intervertebral disc is labeled L5-S1.   Alignment: Mild (grade 1) anterolisthesis of L3 on L4. Otherwise, no substantial sagittal subluxation.   Vertebrae: No specific evidence of acute fracture or discitis/osteomyelitis. Vertebral body heights are maintained. Mild edema in the posterior elements at L4, most likely degenerative/stress related.   Conus medullaris and cauda equina: Conus extends to the L1-L2 level. Conus appears normal.   Paraspinal and other soft tissues: Unremarkable.   Disc levels:   T12-L1: Disc height loss. No significant disc protrusion, foraminal stenosis, or canal stenosis.   L1-L2: Disc height loss. Slight disc bulging without significant canal or foraminal stenosis.   L2-L3: No significant disc protrusion, foraminal stenosis, or canal stenosis.   L3-L4: Mild (grade 1) anterolisthesis of L3 on L4. Uncovering of the disc with mild superimposed broad disc bulge. Mild-to-moderate bilateral facet hypertrophy and ligamentum flavum thickening. Mild canal stenosis and mild left foraminal stenosis. No significant right foraminal stenosis.   L4-L5: Broad disc bulge with mild bilateral facet hypertrophy and ligamentum flavum thickening. No significant canal or foraminal stenosis.   L5-S1: Right paracentral disc protrusion likely contacts the descending right L5 nerve roots (series 3, image 6; series 6, image 37) without evidence of impingement. No significant canal  or foraminal stenosis.   IMPRESSION: 1. At L3-L4, grade 1 anterolisthesis with mild canal stenosis and mild left foraminal stenosis. 2. At L5-S1, right paracentral disc protrusion likely contacts the descending right L5 nerve roots without evidence of impingement. No significant canal or foraminal stenosis.     Electronically Signed   By: Margaretha Sheffield MD   On: 06/01/2021 10:14     Objective:  VS:  HT:    WT:   BMI:     BP:118/84  HR:80bpm  TEMP: ( )  RESP:  Physical Exam Vitals and nursing note reviewed.  Constitutional:      General: She is not in acute distress.    Appearance: Normal appearance. She is not ill-appearing.  HENT:     Head: Normocephalic and atraumatic.     Right Ear: External ear normal.     Left Ear: External ear normal.  Eyes:     Extraocular Movements: Extraocular movements intact.  Cardiovascular:     Rate and Rhythm: Normal rate.     Pulses: Normal pulses.  Pulmonary:     Effort: Pulmonary effort is normal. No respiratory distress.  Abdominal:     General: There is no distension.     Palpations: Abdomen is soft.  Musculoskeletal:        General: Tenderness present.     Cervical back: Neck supple.     Right lower leg: No edema.  Left lower leg: No edema.     Comments: Patient has good distal strength with no pain over the greater trochanters.  No clonus or focal weakness. Patient somewhat slow to rise from a seated position to full extension.  There is concordant low back pain with facet loading and lumbar spine extension rotation.  There are no definitive trigger points but the patient is somewhat tender across the lower back and PSIS.  There is no pain with hip rotation.   Skin:    Findings: No erythema, lesion or rash.  Neurological:     General: No focal deficit present.     Mental Status: She is alert and oriented to person, place, and time.     Sensory: No sensory deficit.     Motor: No weakness or abnormal muscle tone.      Coordination: Coordination normal.  Psychiatric:        Mood and Affect: Mood normal.        Behavior: Behavior normal.     Imaging: No results found.

## 2021-08-28 DIAGNOSIS — E119 Type 2 diabetes mellitus without complications: Secondary | ICD-10-CM | POA: Diagnosis not present

## 2021-09-07 DIAGNOSIS — H02834 Dermatochalasis of left upper eyelid: Secondary | ICD-10-CM | POA: Diagnosis not present

## 2021-09-07 DIAGNOSIS — H02423 Myogenic ptosis of bilateral eyelids: Secondary | ICD-10-CM | POA: Diagnosis not present

## 2021-09-07 DIAGNOSIS — H0279 Other degenerative disorders of eyelid and periocular area: Secondary | ICD-10-CM | POA: Diagnosis not present

## 2021-09-07 DIAGNOSIS — H02831 Dermatochalasis of right upper eyelid: Secondary | ICD-10-CM | POA: Diagnosis not present

## 2021-09-07 DIAGNOSIS — H02413 Mechanical ptosis of bilateral eyelids: Secondary | ICD-10-CM | POA: Diagnosis not present

## 2021-09-15 ENCOUNTER — Other Ambulatory Visit: Payer: Self-pay

## 2021-09-15 ENCOUNTER — Ambulatory Visit (INDEPENDENT_AMBULATORY_CARE_PROVIDER_SITE_OTHER): Payer: BC Managed Care – PPO | Admitting: Physical Medicine and Rehabilitation

## 2021-09-15 ENCOUNTER — Ambulatory Visit: Payer: Self-pay

## 2021-09-15 VITALS — BP 121/74 | HR 92

## 2021-09-15 DIAGNOSIS — M47819 Spondylosis without myelopathy or radiculopathy, site unspecified: Secondary | ICD-10-CM | POA: Diagnosis not present

## 2021-09-15 MED ORDER — BUPIVACAINE HCL 0.5 % IJ SOLN
3.0000 mL | Freq: Once | INTRAMUSCULAR | Status: AC
  Administered 2021-09-15: 3 mL

## 2021-09-15 NOTE — Patient Instructions (Signed)

## 2021-09-15 NOTE — Progress Notes (Signed)
Pt state lower back pain that travels to both hips. Pt state climbing stairs, standing or sitting a long time makes the pain worse. Pt state she takes pajn meds to help ease her pain. Pt has hx of inj on 08/19/21 pt state it helped sum, same day felt like it was coming back.  Numeric Pain Rating Scale and Functional Assessment Average Pain 10   In the last MONTH (on 0-10 scale) has pain interfered with the following?  1. General activity like being  able to carry out your everyday physical activities such as walking, climbing stairs, carrying groceries, or moving a chair?  Rating(10)   +Driver, -BT, -Dye Allergies.

## 2021-09-19 DIAGNOSIS — R051 Acute cough: Secondary | ICD-10-CM | POA: Diagnosis not present

## 2021-09-19 DIAGNOSIS — R509 Fever, unspecified: Secondary | ICD-10-CM | POA: Diagnosis not present

## 2021-09-19 DIAGNOSIS — Z03818 Encounter for observation for suspected exposure to other biological agents ruled out: Secondary | ICD-10-CM | POA: Diagnosis not present

## 2021-09-19 DIAGNOSIS — R0981 Nasal congestion: Secondary | ICD-10-CM | POA: Diagnosis not present

## 2021-09-19 DIAGNOSIS — R197 Diarrhea, unspecified: Secondary | ICD-10-CM | POA: Diagnosis not present

## 2021-09-27 ENCOUNTER — Other Ambulatory Visit (HOSPITAL_COMMUNITY): Payer: Self-pay | Admitting: Internal Medicine

## 2021-09-28 ENCOUNTER — Encounter: Payer: Self-pay | Admitting: Physical Medicine and Rehabilitation

## 2021-09-28 DIAGNOSIS — M47819 Spondylosis without myelopathy or radiculopathy, site unspecified: Secondary | ICD-10-CM

## 2021-09-28 NOTE — Procedures (Signed)
Lumbar Diagnostic Facet Joint Nerve Block with Fluoroscopic Guidance   Patient: Renee Howell      Date of Birth: 1966-08-25 MRN: [id number] PCP: Glenford Bayley, DO      Visit Date: 09/15/2021   Universal Protocol:    Date/Time: 11/01/225:23 AM  Consent Given By: the patient  Position: PRONE  Additional Comments: Vital signs were monitored before and after the procedure. Patient was prepped and draped in the usual sterile fashion. The correct patient, procedure, and site was verified.   Injection Procedure Details:   Procedure diagnoses:  1. Spondylosis without myelopathy or radiculopathy      Meds Administered:  Meds ordered this encounter  Medications   bupivacaine (MARCAINE) 0.5 % (with pres) injection 3 mL     Laterality: Bilateral  Location/Site: L3-L4, L2 and L3 medial branches  Needle: 5.0 in., 25 ga.  Short bevel or Quincke spinal needle  Needle Placement: Oblique pedical  Findings:   -Comments: There was excellent flow of contrast along the articular pillars without intravascular flow.  Procedure Details: The fluoroscope beam is vertically oriented in AP and then obliqued 15 to 20 degrees to the ipsilateral side of the desired nerve to achieve the "Scotty dog" appearance.  The skin over the target area of the junction of the superior articulating process and the transverse process (sacral ala if blocking the L5 dorsal rami) was locally anesthetized with a 1 ml volume of 1% Lidocaine without Epinephrine.  The spinal needle was inserted and advanced in a trajectory view down to the target.   After contact with periosteum and negative aspirate for blood and CSF, correct placement without intravascular or epidural spread was confirmed by injecting 0.5 ml. of Isovue-250.  A spot radiograph was obtained of this image.    Next, a 0.5 ml. volume of the injectate described above was injected. The needle was then redirected to the other facet joint nerves mentioned  above if needed.  Prior to the procedure, the patient was given a Pain Diary which was completed for baseline measurements.  After the procedure, the patient rated their pain every 30 minutes and will continue rating at this frequency for a total of 5 hours.  The patient has been asked to complete the Diary and return to [place] by mail, fax or hand delivered as soon as possible.   Additional Comments:  No complications occurred Dressing: 2 x 2 sterile gauze and Band-Aid    Post-procedure details: Patient was observed during the procedure. Post-procedure instructions were reviewed.  Patient left the clinic in stable condition.

## 2021-09-28 NOTE — Progress Notes (Signed)
Renee Howell - 55 y.o. female MRN [id number]  Date of birth: 04-26-1966  Office Visit Note: Visit Date: 09/15/2021 PCP: Glenford Bayley, DO Referred by: Glenford Bayley, DO  Subjective: Chief Complaint  Patient presents with   Lower Back - Pain   Right Hip - Pain   Left Hip - Pain   HPI:  Renee Howell is a 55 y.o. female who comes in today for planned repeat Bilateral L3-4 Lumbar facet/medial branch block with fluoroscopic guidance.  The patient has failed conservative care including home exercise, medications, time and activity modification.  This injection will be diagnostic and hopefully therapeutic.  Please see requesting physician notes for further details and justification.  Exam shows concordant low back pain with facet joint loading and extension. Patient received more than 80% pain relief from prior injection. This would be the second block in a diagnostic double block paradigm.     Referring:Dr. Jean Rosenthal   ROS Otherwise per HPI.  Assessment & Plan: Visit Diagnoses:    ICD-10-CM   1. Spondylosis without myelopathy or radiculopathy  M47.819 XR C-ARM NO REPORT    Facet Injection    bupivacaine (MARCAINE) 0.5 % (with pres) injection 3 mL      Plan: No additional findings.   Meds & Orders:  Meds ordered this encounter  Medications   bupivacaine (MARCAINE) 0.5 % (with pres) injection 3 mL    Orders Placed This Encounter  Procedures   Facet Injection   XR C-ARM NO REPORT    Follow-up: Return for Review Pain Diary.   Procedures: No procedures performed  Lumbar Diagnostic Facet Joint Nerve Block with Fluoroscopic Guidance   Patient: Renee Howell      Date of Birth: 1966-06-20 MRN: [id number] PCP: Glenford Bayley, DO      Visit Date: 09/15/2021   Universal Protocol:    Date/Time: 11/01/225:23 AM  Consent Given By: the patient  Position: PRONE  Additional Comments: Vital signs were monitored before and after the procedure. Patient  was prepped and draped in the usual sterile fashion. The correct patient, procedure, and site was verified.   Injection Procedure Details:   Procedure diagnoses:  1. Spondylosis without myelopathy or radiculopathy      Meds Administered:  Meds ordered this encounter  Medications   bupivacaine (MARCAINE) 0.5 % (with pres) injection 3 mL     Laterality: Bilateral  Location/Site: L3-L4, L2 and L3 medial branches  Needle: 5.0 in., 25 ga.  Short bevel or Quincke spinal needle  Needle Placement: Oblique pedical  Findings:   -Comments: There was excellent flow of contrast along the articular pillars without intravascular flow.  Procedure Details: The fluoroscope beam is vertically oriented in AP and then obliqued 15 to 20 degrees to the ipsilateral side of the desired nerve to achieve the "Scotty dog" appearance.  The skin over the target area of the junction of the superior articulating process and the transverse process (sacral ala if blocking the L5 dorsal rami) was locally anesthetized with a 1 ml volume of 1% Lidocaine without Epinephrine.  The spinal needle was inserted and advanced in a trajectory view down to the target.   After contact with periosteum and negative aspirate for blood and CSF, correct placement without intravascular or epidural spread was confirmed by injecting 0.5 ml. of Isovue-250.  A spot radiograph was obtained of this image.    Next, a 0.5 ml. volume of the injectate described above was injected. The needle  was then redirected to the other facet joint nerves mentioned above if needed.  Prior to the procedure, the patient was given a Pain Diary which was completed for baseline measurements.  After the procedure, the patient rated their pain every 30 minutes and will continue rating at this frequency for a total of 5 hours.  The patient has been asked to complete the Diary and return to [place] by mail, fax or hand delivered as soon as possible.   Additional  Comments:  No complications occurred Dressing: 2 x 2 sterile gauze and Band-Aid    Post-procedure details: Patient was observed during the procedure. Post-procedure instructions were reviewed.  Patient left the clinic in stable condition.    Clinical History: MRI LUMBAR SPINE WITHOUT CONTRAST   TECHNIQUE: Multiplanar, multisequence MR imaging of the lumbar spine was performed. No intravenous contrast was administered.   COMPARISON:  Lumbar radiographs May 13, 2021.   FINDINGS: Segmentation: Standard segmentation. The inferior-most fully formed intervertebral disc is labeled L5-S1.   Alignment: Mild (grade 1) anterolisthesis of L3 on L4. Otherwise, no substantial sagittal subluxation.   Vertebrae: No specific evidence of acute fracture or discitis/osteomyelitis. Vertebral body heights are maintained. Mild edema in the posterior elements at L4, most likely degenerative/stress related.   Conus medullaris and cauda equina: Conus extends to the L1-L2 level. Conus appears normal.   Paraspinal and other soft tissues: Unremarkable.   Disc levels:   T12-L1: Disc height loss. No significant disc protrusion, foraminal stenosis, or canal stenosis.   L1-L2: Disc height loss. Slight disc bulging without significant canal or foraminal stenosis.   L2-L3: No significant disc protrusion, foraminal stenosis, or canal stenosis.   L3-L4: Mild (grade 1) anterolisthesis of L3 on L4. Uncovering of the disc with mild superimposed broad disc bulge. Mild-to-moderate bilateral facet hypertrophy and ligamentum flavum thickening. Mild canal stenosis and mild left foraminal stenosis. No significant right foraminal stenosis.   L4-L5: Broad disc bulge with mild bilateral facet hypertrophy and ligamentum flavum thickening. No significant canal or foraminal stenosis.   L5-S1: Right paracentral disc protrusion likely contacts the descending right L5 nerve roots (series 3, image 6; series 6,  image 37) without evidence of impingement. No significant canal or foraminal stenosis.   IMPRESSION: 1. At L3-L4, grade 1 anterolisthesis with mild canal stenosis and mild left foraminal stenosis. 2. At L5-S1, right paracentral disc protrusion likely contacts the descending right L5 nerve roots without evidence of impingement. No significant canal or foraminal stenosis.     Electronically Signed   By: Margaretha Sheffield MD   On: 06/01/2021 10:14     Objective:  VS:  HT:    WT:   BMI:     BP:121/74  HR:92bpm  TEMP: ( )  RESP:  Physical Exam Vitals and nursing note reviewed.  Constitutional:      General: She is not in acute distress.    Appearance: Normal appearance. She is obese. She is not ill-appearing.  HENT:     Head: Normocephalic and atraumatic.     Right Ear: External ear normal.     Left Ear: External ear normal.  Eyes:     Extraocular Movements: Extraocular movements intact.  Cardiovascular:     Rate and Rhythm: Normal rate.     Pulses: Normal pulses.  Pulmonary:     Effort: Pulmonary effort is normal. No respiratory distress.  Abdominal:     General: There is no distension.     Palpations: Abdomen is soft.  Musculoskeletal:  General: Tenderness present.     Cervical back: Neck supple.     Right lower leg: No edema.     Left lower leg: No edema.     Comments: Patient has good distal strength with no pain over the greater trochanters.  No clonus or focal weakness. Patient somewhat slow to rise from a seated position to full extension.  There is concordant low back pain with facet loading and lumbar spine extension rotation.  There are no definitive trigger points but the patient is somewhat tender across the lower back and PSIS.  There is no pain with hip rotation.   Skin:    Findings: No erythema, lesion or rash.  Neurological:     General: No focal deficit present.     Mental Status: She is alert and oriented to person, place, and time.      Sensory: No sensory deficit.     Motor: No weakness or abnormal muscle tone.     Coordination: Coordination normal.  Psychiatric:        Mood and Affect: Mood normal.        Behavior: Behavior normal.     Imaging: No results found.

## 2021-10-01 NOTE — Telephone Encounter (Signed)
Patient states that she does feel like she had 50% relief of her pain for a few hours following the medial branch blocks. Referral placed for auth for RFA.

## 2021-10-06 DIAGNOSIS — Z23 Encounter for immunization: Secondary | ICD-10-CM | POA: Diagnosis not present

## 2021-10-06 DIAGNOSIS — Z0000 Encounter for general adult medical examination without abnormal findings: Secondary | ICD-10-CM | POA: Diagnosis not present

## 2021-10-08 DIAGNOSIS — E1165 Type 2 diabetes mellitus with hyperglycemia: Secondary | ICD-10-CM | POA: Diagnosis not present

## 2021-10-08 DIAGNOSIS — E785 Hyperlipidemia, unspecified: Secondary | ICD-10-CM | POA: Diagnosis not present

## 2021-10-08 DIAGNOSIS — Z23 Encounter for immunization: Secondary | ICD-10-CM | POA: Diagnosis not present

## 2021-10-15 ENCOUNTER — Telehealth (HOSPITAL_COMMUNITY): Payer: Self-pay | Admitting: Pharmacy Technician

## 2021-10-15 NOTE — Telephone Encounter (Signed)
Advanced Heart Failure Patient Advocate Encounter  Prior Authorization for [place] has been submitted and approved.    PA# [id number] Effective dates: 09/15/21 through 10/15/22  Charlann Boxer, CPhT

## 2021-10-22 ENCOUNTER — Encounter: Payer: Self-pay | Admitting: Physical Medicine and Rehabilitation

## 2021-10-28 DIAGNOSIS — D1801 Hemangioma of skin and subcutaneous tissue: Secondary | ICD-10-CM | POA: Diagnosis not present

## 2021-10-28 DIAGNOSIS — Z85820 Personal history of malignant melanoma of skin: Secondary | ICD-10-CM | POA: Diagnosis not present

## 2021-10-28 DIAGNOSIS — L821 Other seborrheic keratosis: Secondary | ICD-10-CM | POA: Diagnosis not present

## 2021-10-28 DIAGNOSIS — H61002 Unspecified perichondritis of left external ear: Secondary | ICD-10-CM | POA: Diagnosis not present

## 2021-10-31 ENCOUNTER — Encounter: Payer: Self-pay | Admitting: Physical Medicine and Rehabilitation

## 2021-11-01 ENCOUNTER — Telehealth: Payer: Self-pay | Admitting: Physical Medicine and Rehabilitation

## 2021-11-01 NOTE — Telephone Encounter (Signed)
Pt called requesting a call back concerning if her insurance company approved back injections. Please call pt about this matter at [phone].

## 2021-11-02 NOTE — Telephone Encounter (Signed)
I talked with pt and advised

## 2021-11-02 NOTE — Telephone Encounter (Signed)
Note in chart, patient called yesterday. Amy submitted for auth for RFA.  Lauren spoke with patient.

## 2021-11-12 ENCOUNTER — Telehealth: Payer: Self-pay | Admitting: Physical Medicine and Rehabilitation

## 2021-11-12 ENCOUNTER — Encounter: Payer: Self-pay | Admitting: Physical Medicine and Rehabilitation

## 2021-11-12 NOTE — Telephone Encounter (Signed)
Pt called requesting a call back about next steps for her back. Seeing a referral for back injections? Please call pt as soon as possible for she states to be in severe pain. Pt phone number is [phone].

## 2021-11-14 ENCOUNTER — Encounter: Payer: Self-pay | Admitting: Physical Medicine and Rehabilitation

## 2021-11-15 ENCOUNTER — Telehealth: Payer: Self-pay | Admitting: Physical Medicine and Rehabilitation

## 2021-11-15 NOTE — Telephone Encounter (Signed)
Pt called and states that she received a letter in the mail from her insurance company stating that she was denied for the RFA procedure. In the pt referral Shena put it was approved. She is confused on what's going on and what to do.   CB [phone]

## 2021-11-18 ENCOUNTER — Ambulatory Visit: Payer: BC Managed Care – PPO | Admitting: Physical Medicine and Rehabilitation

## 2021-11-25 DIAGNOSIS — E119 Type 2 diabetes mellitus without complications: Secondary | ICD-10-CM | POA: Diagnosis not present

## 2021-12-08 ENCOUNTER — Ambulatory Visit: Payer: Self-pay

## 2021-12-08 ENCOUNTER — Encounter: Payer: Self-pay | Admitting: Physical Medicine and Rehabilitation

## 2021-12-08 ENCOUNTER — Ambulatory Visit (INDEPENDENT_AMBULATORY_CARE_PROVIDER_SITE_OTHER): Payer: BC Managed Care – PPO | Admitting: Physical Medicine and Rehabilitation

## 2021-12-08 ENCOUNTER — Other Ambulatory Visit: Payer: Self-pay

## 2021-12-08 VITALS — BP 127/72 | HR 76

## 2021-12-08 DIAGNOSIS — M47819 Spondylosis without myelopathy or radiculopathy, site unspecified: Secondary | ICD-10-CM

## 2021-12-08 MED ORDER — METHYLPREDNISOLONE ACETATE 80 MG/ML IJ SUSP
80.0000 mg | Freq: Once | INTRAMUSCULAR | Status: AC
  Administered 2021-12-08: 80 mg

## 2021-12-08 NOTE — Patient Instructions (Signed)

## 2021-12-08 NOTE — Progress Notes (Signed)
Pt state lower back pain that travels to both hips. Pt state climbing stairs, standing or sitting a long time makes the pain worse. Pt state she takes pajn meds to help ease her pain.  Numeric Pain Rating Scale and Functional Assessment Average Pain 9   In the last MONTH (on 0-10 scale) has pain interfered with the following?  1. General activity like being  able to carry out your everyday physical activities such as walking, climbing stairs, carrying groceries, or moving a chair?  Rating(10)   +Driver, -BT, -Dye Allergies.

## 2021-12-22 ENCOUNTER — Encounter: Payer: BC Managed Care – PPO | Admitting: Physical Medicine and Rehabilitation

## 2022-01-01 NOTE — Procedures (Signed)
Lumbar Facet Joint Nerve Denervation  Patient: Renee Howell      Date of Birth: 1966/05/04 MRN: [id number] PCP: Glenford Bayley, DO      Visit Date: 12/08/2021   Universal Protocol:    Date/Time: 02/04/237:28 AM  Consent Given By: the patient  Position: PRONE  Additional Comments: Vital signs were monitored before and after the procedure. Patient was prepped and draped in the usual sterile fashion. The correct patient, procedure, and site was verified.   Injection Procedure Details:   Procedure diagnoses:  1. Spondylosis without myelopathy or radiculopathy      Meds Administered:  Meds ordered this encounter  Medications   methylPREDNISolone acetate (DEPO-MEDROL) injection 80 mg     Laterality: Bilateral  Location/Site:  L3-L4, L2 and L3 medial branches  Needle: 18 ga.,  70mm active tip, 13mm RF Cannula  Needle Placement: Along juncture of superior articular process and transverse pocess  Findings:  -Comments:  Procedure Details: For each desired target nerve, the corresponding transverse process (sacral ala for the L5 dorsal rami) was identified and the fluoroscope was positioned to square off the endplates of the corresponding vertebral body to achieve a true AP midline view.  The beam was then obliqued 15 to 20 degrees and caudally tilted 15 to 20 degrees to line up a trajectory along the target nerves. The skin over the target of the junction of superior articulating process and transverse process (sacral ala for the L5 dorsal rami) was infiltrated with 63ml of 1% Lidocaine without Epinephrine.  The 18 gauge 52mm active tip outer cannula was advanced in trajectory view to the target.  This procedure was repeated for each target nerve.  Then, for all levels, the outer cannula placement was fine-tuned and the position was then confirmed with bi-planar imaging.    Test stimulation was done both at sensory and motor levels to ensure there was no radicular  stimulation. The target tissues were then infiltrated with 1 ml of 1% Lidocaine without Epinephrine. Subsequently, a percutaneous neurotomy was carried out for 90 seconds at 80 degrees Celsius.  After the completion of the lesion, 1 ml of injectate was delivered. It was then repeated for each facet joint nerve mentioned above. Appropriate radiographs were obtained to verify the probe placement during the neurotomy.   Additional Comments:  The patient tolerated the procedure well Dressing: 2 x 2 sterile gauze and Band-Aid    Post-procedure details: Patient was observed during the procedure. Post-procedure instructions were reviewed.  Patient left the clinic in stable condition.

## 2022-01-01 NOTE — Progress Notes (Signed)
DANETTE WEINFELD - 56 y.o. female MRN [id number]  Date of birth: [birth year]  Office Visit Note: Visit Date: 12/08/2021 PCP: Glenford Bayley, DO Referred by: Glenford Bayley, DO  Subjective: Chief Complaint  Patient presents with   Lower Back - Pain   Right Hip - Pain   Left Hip - Pain   HPI:  KYNNADI DICENSO is a 56 y.o. female who comes in todayfor planned radiofrequency ablation of the Bilateral L4-5 Lumbar facet joints. This would be ablation of the corresponding medial branches and/or dorsal rami.  Patient has had double diagnostic blocks with more than 50% relief.  These are documented on pain diary.  They have had chronic back pain for quite some time, more than 3 months, which has been an ongoing situation with recalcitrant axial back pain.  They have no radicular pain.  Their axial pain is worse with standing and ambulating and on exam today with facet loading.  They have had physical therapy as well as home exercise program.  The imaging noted in the chart below indicated facet pathology. Accordingly they meet all the criteria and qualification for for radiofrequency ablation and we are going to complete this today hopefully for more longer term relief as part of comprehensive management program.  ROS Otherwise per HPI.  Assessment & Plan: Visit Diagnoses:    ICD-10-CM   1. Spondylosis without myelopathy or radiculopathy  M47.819 XR C-ARM NO REPORT    Radiofrequency,Lumbar    methylPREDNISolone acetate (DEPO-MEDROL) injection 80 mg      Plan: No additional findings.   Meds & Orders:  Meds ordered this encounter  Medications   methylPREDNISolone acetate (DEPO-MEDROL) injection 80 mg    Orders Placed This Encounter  Procedures   Radiofrequency,Lumbar   XR C-ARM NO REPORT    Follow-up: Return if symptoms worsen or fail to improve.   Procedures: No procedures performed  Lumbar Facet Joint Nerve Denervation  Patient: LETY CULLENS      Date of Birth:  02-Apr-1966 MRN: [id number] PCP: Glenford Bayley, DO      Visit Date: 12/08/2021   Universal Protocol:    Date/Time: 02/04/237:28 AM  Consent Given By: the patient  Position: PRONE  Additional Comments: Vital signs were monitored before and after the procedure. Patient was prepped and draped in the usual sterile fashion. The correct patient, procedure, and site was verified.   Injection Procedure Details:   Procedure diagnoses:  1. Spondylosis without myelopathy or radiculopathy      Meds Administered:  Meds ordered this encounter  Medications   methylPREDNISolone acetate (DEPO-MEDROL) injection 80 mg     Laterality: Bilateral  Location/Site:  L3-L4, L2 and L3 medial branches  Needle: 18 ga.,  45mm active tip, 145mm RF Cannula  Needle Placement: Along juncture of superior articular process and transverse pocess  Findings:  -Comments:  Procedure Details: For each desired target nerve, the corresponding transverse process (sacral ala for the L5 dorsal rami) was identified and the fluoroscope was positioned to square off the endplates of the corresponding vertebral body to achieve a true AP midline view.  The beam was then obliqued 15 to 20 degrees and caudally tilted 15 to 20 degrees to line up a trajectory along the target nerves. The skin over the target of the junction of superior articulating process and transverse process (sacral ala for the L5 dorsal rami) was infiltrated with 52ml of 1% Lidocaine without Epinephrine.  The 18 gauge 83mm active tip outer  cannula was advanced in trajectory view to the target.  This procedure was repeated for each target nerve.  Then, for all levels, the outer cannula placement was fine-tuned and the position was then confirmed with bi-planar imaging.    Test stimulation was done both at sensory and motor levels to ensure there was no radicular stimulation. The target tissues were then infiltrated with 1 ml of 1% Lidocaine without Epinephrine.  Subsequently, a percutaneous neurotomy was carried out for 90 seconds at 80 degrees Celsius.  After the completion of the lesion, 1 ml of injectate was delivered. It was then repeated for each facet joint nerve mentioned above. Appropriate radiographs were obtained to verify the probe placement during the neurotomy.   Additional Comments:  The patient tolerated the procedure well Dressing: 2 x 2 sterile gauze and Band-Aid    Post-procedure details: Patient was observed during the procedure. Post-procedure instructions were reviewed.  Patient left the clinic in stable condition.       Clinical History: MRI LUMBAR SPINE WITHOUT CONTRAST   TECHNIQUE: Multiplanar, multisequence MR imaging of the lumbar spine was performed. No intravenous contrast was administered.   COMPARISON:  Lumbar radiographs May 13, 2021.   FINDINGS: Segmentation: Standard segmentation. The inferior-most fully formed intervertebral disc is labeled L5-S1.   Alignment: Mild (grade 1) anterolisthesis of L3 on L4. Otherwise, no substantial sagittal subluxation.   Vertebrae: No specific evidence of acute fracture or discitis/osteomyelitis. Vertebral body heights are maintained. Mild edema in the posterior elements at L4, most likely degenerative/stress related.   Conus medullaris and cauda equina: Conus extends to the L1-L2 level. Conus appears normal.   Paraspinal and other soft tissues: Unremarkable.   Disc levels:   T12-L1: Disc height loss. No significant disc protrusion, foraminal stenosis, or canal stenosis.   L1-L2: Disc height loss. Slight disc bulging without significant canal or foraminal stenosis.   L2-L3: No significant disc protrusion, foraminal stenosis, or canal stenosis.   L3-L4: Mild (grade 1) anterolisthesis of L3 on L4. Uncovering of the disc with mild superimposed broad disc bulge. Mild-to-moderate bilateral facet hypertrophy and ligamentum flavum thickening. Mild canal  stenosis and mild left foraminal stenosis. No significant right foraminal stenosis.   L4-L5: Broad disc bulge with mild bilateral facet hypertrophy and ligamentum flavum thickening. No significant canal or foraminal stenosis.   L5-S1: Right paracentral disc protrusion likely contacts the descending right L5 nerve roots (series 3, image 6; series 6, image 37) without evidence of impingement. No significant canal or foraminal stenosis.   IMPRESSION: 1. At L3-L4, grade 1 anterolisthesis with mild canal stenosis and mild left foraminal stenosis. 2. At L5-S1, right paracentral disc protrusion likely contacts the descending right L5 nerve roots without evidence of impingement. No significant canal or foraminal stenosis.     Electronically Signed   By: Margaretha Sheffield MD   On: 06/01/2021 10:14     Objective:  VS:  HT:     WT:    BMI:      BP:127/72   HR:76bpm   TEMP: ( )   RESP:  Physical Exam Vitals and nursing note reviewed.  Constitutional:      General: She is not in acute distress.    Appearance: Normal appearance. She is not ill-appearing.  HENT:     Head: Normocephalic and atraumatic.     Right Ear: External ear normal.     Left Ear: External ear normal.  Eyes:     Extraocular Movements: Extraocular movements intact.  Cardiovascular:  Rate and Rhythm: Normal rate.     Pulses: Normal pulses.  Pulmonary:     Effort: Pulmonary effort is normal. No respiratory distress.  Abdominal:     General: There is no distension.     Palpations: Abdomen is soft.  Musculoskeletal:        General: Tenderness present.     Cervical back: Neck supple.     Right lower leg: No edema.     Left lower leg: No edema.     Comments: Patient has good distal strength with no pain over the greater trochanters.  No clonus or focal weakness. Patient somewhat slow to rise from a seated position to full extension.  There is concordant low back pain with facet loading and lumbar spine extension  rotation.  There are no definitive trigger points but the patient is somewhat tender across the lower back and PSIS.  There is no pain with hip rotation.   Skin:    Findings: No erythema, lesion or rash.  Neurological:     General: No focal deficit present.     Mental Status: She is alert and oriented to person, place, and time.     Sensory: No sensory deficit.     Motor: No weakness or abnormal muscle tone.     Coordination: Coordination normal.  Psychiatric:        Mood and Affect: Mood normal.        Behavior: Behavior normal.     Imaging: No results found.

## 2022-01-12 DIAGNOSIS — Z23 Encounter for immunization: Secondary | ICD-10-CM | POA: Diagnosis not present

## 2022-01-25 ENCOUNTER — Encounter: Payer: Self-pay | Admitting: Neurology

## 2022-01-27 DIAGNOSIS — E1165 Type 2 diabetes mellitus with hyperglycemia: Secondary | ICD-10-CM | POA: Diagnosis not present

## 2022-01-27 DIAGNOSIS — E7800 Pure hypercholesterolemia, unspecified: Secondary | ICD-10-CM | POA: Diagnosis not present

## 2022-01-28 DIAGNOSIS — E7800 Pure hypercholesterolemia, unspecified: Secondary | ICD-10-CM | POA: Diagnosis not present

## 2022-01-28 DIAGNOSIS — I10 Essential (primary) hypertension: Secondary | ICD-10-CM | POA: Diagnosis not present

## 2022-01-28 DIAGNOSIS — Z9641 Presence of insulin pump (external) (internal): Secondary | ICD-10-CM | POA: Diagnosis not present

## 2022-01-28 DIAGNOSIS — E1165 Type 2 diabetes mellitus with hyperglycemia: Secondary | ICD-10-CM | POA: Diagnosis not present

## 2022-02-07 DIAGNOSIS — G4712 Idiopathic hypersomnia without long sleep time: Secondary | ICD-10-CM | POA: Diagnosis not present

## 2022-02-10 ENCOUNTER — Other Ambulatory Visit (HOSPITAL_COMMUNITY): Payer: Self-pay | Admitting: *Deleted

## 2022-02-10 DIAGNOSIS — L720 Epidermal cyst: Secondary | ICD-10-CM | POA: Diagnosis not present

## 2022-02-10 DIAGNOSIS — D1801 Hemangioma of skin and subcutaneous tissue: Secondary | ICD-10-CM | POA: Diagnosis not present

## 2022-02-10 DIAGNOSIS — L821 Other seborrheic keratosis: Secondary | ICD-10-CM | POA: Diagnosis not present

## 2022-02-10 DIAGNOSIS — L820 Inflamed seborrheic keratosis: Secondary | ICD-10-CM | POA: Diagnosis not present

## 2022-02-10 DIAGNOSIS — L814 Other melanin hyperpigmentation: Secondary | ICD-10-CM | POA: Diagnosis not present

## 2022-02-10 MED ORDER — SPIRONOLACTONE 25 MG PO TABS: ORAL_TABLET | ORAL | 3 refills | Status: DC

## 2022-02-10 MED ORDER — EMPAGLIFLOZIN 10 MG PO TABS: 10.0000 mg | ORAL_TABLET | Freq: Every day | ORAL | 3 refills | Status: DC

## 2022-02-10 MED ORDER — ATORVASTATIN CALCIUM 10 MG PO TABS: 10.0000 mg | ORAL_TABLET | Freq: Every day | ORAL | 3 refills | Status: DC

## 2022-02-10 MED ORDER — IVABRADINE HCL 5 MG PO TABS: ORAL_TABLET | ORAL | 3 refills | Status: DC

## 2022-02-10 MED ORDER — CARVEDILOL 6.25 MG PO TABS: 6.2500 mg | ORAL_TABLET | Freq: Two times a day (BID) | ORAL | 3 refills | Status: DC

## 2022-02-10 MED ORDER — ENTRESTO 24-26 MG PO TABS: 1.0000 | ORAL_TABLET | Freq: Two times a day (BID) | ORAL | 3 refills | Status: DC

## 2022-02-23 DIAGNOSIS — H40053 Ocular hypertension, bilateral: Secondary | ICD-10-CM | POA: Diagnosis not present

## 2022-02-23 DIAGNOSIS — H1789 Other corneal scars and opacities: Secondary | ICD-10-CM | POA: Diagnosis not present

## 2022-02-23 DIAGNOSIS — H02421 Myogenic ptosis of right eyelid: Secondary | ICD-10-CM | POA: Diagnosis not present

## 2022-02-23 DIAGNOSIS — E113293 Type 2 diabetes mellitus with mild nonproliferative diabetic retinopathy without macular edema, bilateral: Secondary | ICD-10-CM | POA: Diagnosis not present

## 2022-02-23 DIAGNOSIS — H2513 Age-related nuclear cataract, bilateral: Secondary | ICD-10-CM | POA: Diagnosis not present

## 2022-03-11 ENCOUNTER — Encounter: Payer: Self-pay | Admitting: Neurology

## 2022-03-11 ENCOUNTER — Ambulatory Visit (INDEPENDENT_AMBULATORY_CARE_PROVIDER_SITE_OTHER): Payer: BC Managed Care – PPO | Admitting: Neurology

## 2022-03-11 VITALS — BP 120/68 | HR 74 | Ht 66.0 in | Wt 214.0 lb

## 2022-03-11 DIAGNOSIS — H02401 Unspecified ptosis of right eyelid: Secondary | ICD-10-CM

## 2022-03-11 DIAGNOSIS — H532 Diplopia: Secondary | ICD-10-CM | POA: Diagnosis not present

## 2022-03-11 NOTE — Progress Notes (Signed)
?[facility] ?Neurology Division ?Clinic Note - Initial Visit ? ? ?Date: 03/11/22 ? ?Renee Howell ?MRN: [id number] ?DOB: 01/24/1966 ? ? ?Dear Dr. Martin Majestic: ? ?Thank you for your kind referral of Renee Howell for consultation of right eye droopiness. Although her history is well known to you, please allow [place] to reiterate it for the purpose of our medical record. The patient was accompanied to the clinic by husband who also provides collateral information.  ?  ? ?History of Present Illness: ?Renee Howell is a 56 y.o. right-handed female with diabetes mellitus, hypertension, CHF, history of ovarian cancer, and OSA presenting for evaluation of right eye droopiness.  ? ?For the past 10 years, she has right eye droopiness which has is constant and worse over the past year.  When she is stressed, worried, and tired, her droopiness gets worse.  It does not involve the left eye. She has seen an eye doctor before who suggested oculoplastic surgeon.  She has occasional double vision with images on top of each other, which can occur at night time or when reading.  She denies problems with swallowing/talking or limb weakness.   ? ?She works as a Print production planner.   ? ?Out-side paper records, electronic medical record, and images have been reviewed where available and summarized as:  ?No results found for: HGBA1C ?No results found for: VITAMINB12 ?Lab Results  ?Component Value Date  ? TSH 2.192 05/22/2017  ? ?Lab Results  ?Component Value Date  ? ESRSEDRATE 55 (H) 12/08/2016  ? ? ?Past Medical History:  ?Diagnosis Date  ? Allergy   ? CHF (congestive heart failure) ([place])   ? Diabetes mellitus   ? Hypertension   ? Kidney infection   ? Ovarian cancer ([place])   ? Sleep apnea   ? ? ?Past Surgical History:  ?Procedure Laterality Date  ? ABDOMINAL HYSTERECTOMY    ? CESAREAN SECTION    ? EYE SURGERY    ? RIGHT/LEFT HEART CATH AND CORONARY ANGIOGRAPHY N/A 05/26/2017  ? Procedure: Right/Left Heart Cath and  Coronary Angiography;  Surgeon: Jolaine Artist, MD;  Location: [facility];  Service: Cardiovascular;  Laterality: N/A;  ? [place]  ? ? ? ?Medications:  ?Outpatient Encounter Medications as of 03/11/2022  ?Medication Sig  ? acetaminophen (TYLENOL) 325 MG tablet Take 650 mg by mouth every 6 (six) hours as needed.  ? atorvastatin (LIPITOR) 10 MG tablet Take 1 tablet (10 mg total) by mouth daily.  ? calcium citrate-vitamin D (CITRACAL+D) 315-200 MG-UNIT tablet Take 1 tablet by mouth 2 (two) times daily.  ? carvedilol (COREG) 6.25 MG tablet Take 1 tablet (6.25 mg total) by mouth 2 (two) times daily with a meal.  ? cholecalciferol (VITAMIN D) 1000 units tablet Take 2,000 Units by mouth daily.  ? DULoxetine (CYMBALTA) 30 MG capsule Take 90 mg by mouth daily.  ? empagliflozin (JARDIANCE) 10 MG TABS tablet Take 1 tablet (10 mg total) by mouth daily.  ? fluticasone (FLONASE) 50 MCG/ACT nasal spray Place 2 sprays into both nostrils daily.  ? furosemide (LASIX) 20 MG tablet TAKE 1 TABLET DAILY AS NEEDED, WEIGHT GAIN GREATER THAN 3 POUNDS IN 1 DAY OR GREATER THAN 5 POUNDS IN 1 WEEK  ? Insulin Human (INSULIN PUMP) SOLN Inject into the skin. Sliding scale  ? ivabradine (CORLANOR) 5 MG TABS tablet TAKE 1 TABLET TWICE A DAY WITH MEALS  ? Melatonin 1 MG TABS Take 5 mg by mouth  at bedtime as needed.   ? metFORMIN (GLUCOPHAGE) 1000 MG tablet Take 1,000 mg by mouth 2 (two) times daily.  ? Misc Natural Products (OSTEO BI-FLEX ADV JOINT SHIELD PO) Take by mouth daily.  ? montelukast (SINGULAIR) 10 MG tablet Take 10 mg by mouth daily.  ? OZEMPIC, 0.25 OR 0.5 MG/DOSE, 2 MG/1.5ML SOPN Inject into the skin.  ? pantoprazole (PROTONIX) 20 MG tablet TK 1 T PO QD  ? ranitidine (ZANTAC) 150 MG tablet Take 150 mg by mouth daily as needed.  ? sacubitril-valsartan (ENTRESTO) 24-26 MG Take 1 tablet by mouth 2 (two) times daily.  ? Solriamfetol HCl (SUNOSI) 75 MG TABS 1 tablet in the morning  ? spironolactone  (ALDACTONE) 25 MG tablet TAKE 1 TABLET DAILY  ? zaleplon (SONATA) 5 MG capsule 1-2 caps for sleep at night as needed  ? tiZANidine (ZANAFLEX) 4 MG tablet TAKE 1 TABLET(4 MG) BY MOUTH EVERY 8 HOURS AS NEEDED FOR MUSCLE SPASMS (Patient not taking: Reported on 03/11/2022)  ? ?No facility-administered encounter medications on file as of 03/11/2022.  ? ? ?Allergies:  ?Allergies  ?Allergen Reactions  ? Trulicity [Dulaglutide] Other (See Comments)  ?  Pt states that the trulicity makes her feel horrible three days after she uses it. States she will never take it again.   ? Latex Rash  ? ? ?Family History: ?Family History  ?Problem Relation Age of Onset  ? Cancer Mother   ? Diabetes Father   ? Bipolar disorder Brother   ? Dementia Brother   ? Diabetes Brother   ? Breast cancer Paternal Grandmother   ? ? ?Social History: ?Social History  ? ?Tobacco Use  ? Smoking status: Never  ? Smokeless tobacco: Never  ?Vaping Use  ? Vaping Use: Never used  ?Substance Use Topics  ? Alcohol use: Yes  ?  Alcohol/week: 3.0 standard drinks  ?  Types: 2 Glasses of wine, 1 Standard drinks or equivalent per week  ?  Comment: occ  ? Drug use: No  ? ?Social History  ? ?Social History Narrative  ? Right Handed   ? Lives in a two story home   ? ? ?Vital Signs:  ?BP 120/68   Pulse 74   Ht '5\' 6"'$  (1.676 m)   Wt 214 lb (97.1 kg)   SpO2 98%   BMI 34.54 kg/m?  ? ?Neurological Exam: ?MENTAL STATUS including orientation to time, place, person, recent and remote memory, attention span and concentration, language, and fund of knowledge is normal.  Speech is not dysarthric. ? ?CRANIAL NERVES: ?II:  No visual field defects.    ?III-IV-VI: Pupils equal round and reactive to light.  Normal conjugate, extra-ocular eye movements in all directions of gaze.  No nystagmus.  Moderate right ptosis at baseline without worsening with sustained upgaze.   ?V:  Normal facial sensation.    ?VII:  Normal facial symmetry and movements.  Facial muscles including orbicularis  oculi, orbicularis oris and buccinator is 5/5 ?VIII:  Normal hearing and vestibular function.   ?IX-X:  Normal palatal movement.   ?XI:  Normal shoulder shrug and head rotation.   ?XII:  Normal tongue strength and range of motion, no deviation or fasciculation. ? ?MOTOR:  Motor strength is 5/5 in all the extremities. No atrophy, fasciculations or abnormal movements.  No pronator drift.  ? ?MSRs:  ?Right        Left                  ?  brachioradialis 2+  2+  ?biceps 2+  2+  ?triceps 2+  2+  ?patellar 2+  2+  ?ankle jerk 2+  2+  ?Hoffman no  no  ?plantar response down  down  ? ?SENSORY:  Normal and symmetric perception of light touch, pinprick, vibration, and proprioception.   ? ?COORDINATION/GAIT: Normal finger-to- nose-finger.  Intact rapid alternating movements bilaterally.  Gait narrow based and stable. Tandem and stressed gait intact.  ? ? ?IMPRESSION: ?Right ptosis with intermittent diplopia, longstanding for the past 10 years.   She has some diurnal variation which raises concern for neuromuscular junction disorder.  Therefore, I will check AChR antibodies and if this is normal, then proceed with single finger EMG.  If testing is positive for myasthenia, then recommend trial of mestinon '60mg'$  TID.  If negative, this is most likely acquired ptosis.  ? ?Thank you for allowing me to participate in patient's care.  If I can answer any additional questions, I would be pleased to do so.   ? ?Sincerely, ? ? ? ?Kevion Fatheree K. Posey Pronto, DO ? ?

## 2022-03-11 NOTE — Patient Instructions (Signed)
Check myasthenia antibodies ? ?If your labs are normal, then we can refer you to [facility] for single fiber EMG ? ? ?

## 2022-04-06 ENCOUNTER — Other Ambulatory Visit: Payer: BC Managed Care – PPO

## 2022-04-06 DIAGNOSIS — H532 Diplopia: Secondary | ICD-10-CM | POA: Diagnosis not present

## 2022-04-06 DIAGNOSIS — K219 Gastro-esophageal reflux disease without esophagitis: Secondary | ICD-10-CM | POA: Diagnosis not present

## 2022-04-06 DIAGNOSIS — H02401 Unspecified ptosis of right eyelid: Secondary | ICD-10-CM | POA: Diagnosis not present

## 2022-04-06 DIAGNOSIS — H579 Unspecified disorder of eye and adnexa: Secondary | ICD-10-CM | POA: Diagnosis not present

## 2022-04-13 ENCOUNTER — Encounter: Payer: Self-pay | Admitting: Neurology

## 2022-04-14 LAB — MYASTHENIA GRAVIS PANEL 2
A CHR BINDING ABS: <0.3 nmol/L
ACHR Blocking Abs: <15 % Inhibition (ref <15)
Acetylchol Modul Ab: <1 % Inhibition

## 2022-04-19 DIAGNOSIS — L239 Allergic contact dermatitis, unspecified cause: Secondary | ICD-10-CM | POA: Diagnosis not present

## 2022-06-13 DIAGNOSIS — L819 Disorder of pigmentation, unspecified: Secondary | ICD-10-CM | POA: Diagnosis not present

## 2022-06-13 DIAGNOSIS — L720 Epidermal cyst: Secondary | ICD-10-CM | POA: Diagnosis not present

## 2022-06-13 DIAGNOSIS — L821 Other seborrheic keratosis: Secondary | ICD-10-CM | POA: Diagnosis not present

## 2022-06-13 DIAGNOSIS — L820 Inflamed seborrheic keratosis: Secondary | ICD-10-CM | POA: Diagnosis not present

## 2022-06-13 DIAGNOSIS — L814 Other melanin hyperpigmentation: Secondary | ICD-10-CM | POA: Diagnosis not present

## 2022-06-17 ENCOUNTER — Telehealth: Payer: Self-pay | Admitting: Neurology

## 2022-06-17 NOTE — Telephone Encounter (Signed)
Called pt to see what labs she needed. Left message to call back. Told her the last labs were in may and Mahina had called and told her they were normal.

## 2022-06-17 NOTE — Telephone Encounter (Signed)
Patient left message with access nurse requesting her lab/test results.

## 2022-06-30 DIAGNOSIS — G4712 Idiopathic hypersomnia without long sleep time: Secondary | ICD-10-CM | POA: Diagnosis not present

## 2022-08-02 DIAGNOSIS — E7800 Pure hypercholesterolemia, unspecified: Secondary | ICD-10-CM | POA: Diagnosis not present

## 2022-08-02 DIAGNOSIS — I10 Essential (primary) hypertension: Secondary | ICD-10-CM | POA: Diagnosis not present

## 2022-08-02 DIAGNOSIS — Z9641 Presence of insulin pump (external) (internal): Secondary | ICD-10-CM | POA: Diagnosis not present

## 2022-08-02 DIAGNOSIS — E1165 Type 2 diabetes mellitus with hyperglycemia: Secondary | ICD-10-CM | POA: Diagnosis not present

## 2022-08-17 DIAGNOSIS — H1789 Other corneal scars and opacities: Secondary | ICD-10-CM | POA: Diagnosis not present

## 2022-08-17 DIAGNOSIS — H16142 Punctate keratitis, left eye: Secondary | ICD-10-CM | POA: Diagnosis not present

## 2022-09-22 ENCOUNTER — Ambulatory Visit (HOSPITAL_COMMUNITY)
Admission: RE | Admit: 2022-09-22 | Discharge: 2022-09-22 | Disposition: A | Discharge status: Home / Self Care | Payer: BC Managed Care – PPO | Source: Ambulatory Visit | Attending: Internal Medicine | Admitting: Internal Medicine

## 2022-09-22 VITALS — BP 110/70 | HR 77 | Wt 175.0 lb

## 2022-09-22 DIAGNOSIS — E669 Obesity, unspecified: Secondary | ICD-10-CM | POA: Diagnosis not present

## 2022-09-22 DIAGNOSIS — F5101 Primary insomnia: Secondary | ICD-10-CM | POA: Insufficient documentation

## 2022-09-22 DIAGNOSIS — I959 Hypotension, unspecified: Secondary | ICD-10-CM | POA: Insufficient documentation

## 2022-09-22 DIAGNOSIS — I5022 Chronic systolic (congestive) heart failure: Secondary | ICD-10-CM | POA: Diagnosis not present

## 2022-09-22 DIAGNOSIS — Z79899 Other long term (current) drug therapy: Secondary | ICD-10-CM | POA: Diagnosis not present

## 2022-09-22 DIAGNOSIS — I429 Cardiomyopathy, unspecified: Secondary | ICD-10-CM | POA: Diagnosis not present

## 2022-09-22 DIAGNOSIS — R000 Tachycardia, unspecified: Secondary | ICD-10-CM | POA: Insufficient documentation

## 2022-09-22 DIAGNOSIS — I5040 Unspecified combined systolic (congestive) and diastolic (congestive) heart failure: Secondary | ICD-10-CM | POA: Insufficient documentation

## 2022-09-22 DIAGNOSIS — G4733 Obstructive sleep apnea (adult) (pediatric): Secondary | ICD-10-CM | POA: Diagnosis not present

## 2022-09-22 DIAGNOSIS — I110 Hypertensive heart disease with heart failure: Secondary | ICD-10-CM | POA: Insufficient documentation

## 2022-09-22 DIAGNOSIS — E119 Type 2 diabetes mellitus without complications: Secondary | ICD-10-CM | POA: Diagnosis not present

## 2022-09-22 DIAGNOSIS — Z9641 Presence of insulin pump (external) (internal): Secondary | ICD-10-CM | POA: Insufficient documentation

## 2022-09-22 DIAGNOSIS — Z794 Long term (current) use of insulin: Secondary | ICD-10-CM | POA: Insufficient documentation

## 2022-09-22 NOTE — Progress Notes (Signed)
Advanced Heart Failure Clinic Note   Primary Cardiologist: Dr. Haroldine Laws   HPI: Renee Howell is a 56 y.o. female with a h/o obesity, HTN. ADD and poorly-controlled DM2, chronic systolic CHF (EF 09-73%).   Diagnosed in June 2018 with NICM, systolic CHF. Found to have an EF of 15-20%. Was markedly overloaded. Admitted Required milrinone for low output HF. Diuresed over 20 pounds. RHC showed Fick output/index 4.4/2.2 on 0.125 mcg Milrinone. Cardiac MRI without infiltrative disease. Milrinone weaned off. It was felt that her cardiomyopathy was related to either undiagnosed sleep apnea, viral or tachy induced (she has been on stimulants for ADD for a long time). Started on corlanor for tachycardia, although tachycardia likely represented the low output HF as well. Her stimulants were stopped at discharge. Discharge weight was 197 pounds.    Echo 3/21 EF 55-60% Grade I DD. RV normal.   Echo 9/22 EF 60-65%. RV ok   Here for routine f/u with her husband. Overall doing well. Has lost 54 pounds with Ozempic. Using Dexcom and Omni pod. Not exercising due to back pain -> had to have ablation. No CP, edema, orthopnea or PND. Gets SOB on   Echo 1/19  EF 50-55% RV normal  Echo (bedside)11/18 30-35% RV ok.    Past Medical History:  Diagnosis Date   Allergy    CHF (congestive heart failure) ([facility])    Diabetes mellitus    Hypertension    Kidney infection    Ovarian cancer ([place])    Sleep apnea     Current Outpatient Medications  Medication Sig Dispense Refill   acetaminophen (TYLENOL) 325 MG tablet Take 650 mg by mouth every 6 (six) hours as needed.     atorvastatin (LIPITOR) 10 MG tablet Take 1 tablet (10 mg total) by mouth daily. 90 tablet 3   calcium citrate-vitamin D (CITRACAL+D) 315-200 MG-UNIT tablet Take 1 tablet by mouth 2 (two) times daily.     carvedilol (COREG) 6.25 MG tablet Take 1 tablet (6.25 mg total) by mouth 2 (two) times daily with a meal. 180 tablet 3    cholecalciferol (VITAMIN D) 1000 units tablet Take 2,000 Units by mouth daily.     DULoxetine (CYMBALTA) 30 MG capsule Take 90 mg by mouth daily.     empagliflozin (JARDIANCE) 10 MG TABS tablet Take 1 tablet (10 mg total) by mouth daily. 90 tablet 3   fluticasone (FLONASE) 50 MCG/ACT nasal spray Place 2 sprays into both nostrils daily.     furosemide (LASIX) 20 MG tablet TAKE 1 TABLET DAILY AS NEEDED, WEIGHT GAIN GREATER THAN 3 POUNDS IN 1 DAY OR GREATER THAN 5 POUNDS IN 1 WEEK 90 tablet 3   Insulin Human (INSULIN PUMP) SOLN Inject into the skin. Sliding scale     ivabradine (CORLANOR) 5 MG TABS tablet TAKE 1 TABLET TWICE A DAY WITH MEALS 180 tablet 3   Melatonin 1 MG TABS Take 5 mg by mouth at bedtime as needed.      metFORMIN (GLUCOPHAGE) 1000 MG tablet Take 1,000 mg by mouth 2 (two) times daily.     Misc Natural Products (OSTEO BI-FLEX ADV JOINT SHIELD PO) Take by mouth daily.     montelukast (SINGULAIR) 10 MG tablet Take 10 mg by mouth daily.  0   OZEMPIC, 0.25 OR 0.5 MG/DOSE, 2 MG/1.5ML SOPN Inject into the skin.     pantoprazole (PROTONIX) 20 MG tablet TK 1 T PO QD     ranitidine (ZANTAC) 150 MG tablet Take  150 mg by mouth daily as needed.     sacubitril-valsartan (ENTRESTO) 24-26 MG Take 1 tablet by mouth 2 (two) times daily. 180 tablet 3   Solriamfetol HCl (SUNOSI) 75 MG TABS 1 tablet in the morning     spironolactone (ALDACTONE) 25 MG tablet TAKE 1 TABLET DAILY 90 tablet 3   zaleplon (SONATA) 5 MG capsule 1-2 caps for sleep at night as needed 30 capsule 2   No current facility-administered medications for this encounter.    Allergies  Allergen Reactions   Trulicity [Dulaglutide] Other (See Comments)    Pt states that the trulicity makes her feel horrible three days after she uses it. States she will never take it again.    Latex Rash      Social History   Socioeconomic History   Marital status: Married    Spouse name: Not on file   Number of children: 2   Years of  education: Not on file   Highest education level: Not on file  Occupational History   Occupation: unemployed  Tobacco Use   Smoking status: Never   Smokeless tobacco: Never  Vaping Use   Vaping Use: Never used  Substance and Sexual Activity   Alcohol use: Yes    Alcohol/week: 3.0 standard drinks of alcohol    Types: 2 Glasses of wine, 1 Standard drinks or equivalent per week    Comment: occ   Drug use: No   Sexual activity: Not on file  Other Topics Concern   Not on file  Social History Narrative   Right Handed    Lives in a two story home    Social Determinants of Health   Financial Resource Strain: Not on file  Food Insecurity: Not on file  Transportation Needs: Not on file  Physical Activity: Not on file  Stress: Not on file  Social Connections: Not on file  Intimate Partner Violence: Not on file      Family History  Problem Relation Age of Onset   Cancer Mother    Diabetes Father    Bipolar disorder Brother    Dementia Brother    Diabetes Brother    Breast cancer Paternal Grandmother     Vitals:   09/22/22 1341  BP: 110/70  Pulse: 77  SpO2: 99%  Weight: 79.4 kg (175 lb)    Wt Readings from Last 3 Encounters:  09/22/22 79.4 kg (175 lb)  03/11/22 97.1 kg (214 lb)  05/13/21 104.2 kg (229 lb 12.8 oz)    PHYSICAL EXAM: General:  Well appearing. No resp difficulty HEENT: normal Neck: supple. no JVD. Carotids 2+ bilat; no bruits. No lymphadenopathy or thryomegaly appreciated. Cor: PMI nondisplaced. Regular rate & rhythm. No rubs, gallops or murmurs. Lungs: clear Abdomen: soft, nontender, nondistended. No hepatosplenomegaly. No bruits or masses. Good bowel sounds. Extremities: no cyanosis, clubbing, rash, edema Neuro: alert & orientedx3, cranial nerves grossly intact. moves all 4 extremities w/o difficulty. Affect pleasant   ECG: pending  ASSESSMENT & PLAN:  1. Chronic systolic CHF with recovered EF: Echo EF 15% with biventricular dysfunction.  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 DM. Normal cors by cath in 04/2017. Cardiac MRI without infiltrative disease. 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 RV normal. - Complete recovery with medical therapy - Echo 02/26/20 EF 55-60% Grade I DD. RV normal.  - Echo 9/22 EF 60-65% - Stable NYHA II - Volume status looks good - Continue spiro, Entresto and Jardiance. 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  - Doing great. EF recovered. F/u with echo in 18 months Continue meds  2. DM - On insulin pump and Dexcom. HgbA1c 6.1 - Continue Jardiance   3. ADD - No longer on stimulants. Would avoid in future if possible   5. Mild OSA with idiopathic insomnia -  Follows with Dr. Maxwell Caul. 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   6. Sinus tach - Much improved on corlanor 5 mg BID and carvedilol. Will continue  7. Obesity  - Has lost 54 pounds with Ozempic    Glori Bickers, MD 09/22/22

## 2022-09-22 NOTE — Addendum Note (Signed)
Encounter addended by: Jerl Mina, RN on: 09/22/2022 2:02 PM  Actions taken: Order list changed, Diagnosis association updated, Clinical Note Signed

## 2022-09-22 NOTE — Patient Instructions (Signed)
There has been no changes to your medications   Your physician has requested that you have an echocardiogram. Echocardiography is a painless test that uses sound waves to create images of your heart. It provides your doctor with information about the size and shape of your heart and how well your heart's chambers and valves are working. This procedure takes approximately one hour. There are no restrictions for this procedure. Please do NOT wear cologne, perfume, aftershave, or lotions (deodorant is allowed). Please arrive 15 minutes prior to your appointment time.  Your physician recommends that you schedule a follow-up appointment in: 18 months (April 2025)  * please call the office in February 2025 to arrange your follow up appointment **  If you have any questions or concerns before your next appointment please send [place] a message through [place] or call our office at [phone].    TO LEAVE A MESSAGE FOR THE NURSE SELECT OPTION 2, PLEASE LEAVE A MESSAGE INCLUDING: YOUR NAME DATE OF BIRTH CALL BACK NUMBER REASON FOR CALL**this is important as we prioritize the call backs  YOU WILL RECEIVE A CALL BACK THE SAME DAY AS LONG AS YOU CALL BEFORE 4:00 PM  At the [facility], you and your health needs are our priority. As part of our continuing mission to provide you with exceptional heart care, we have created designated Provider Care Teams. These Care Teams include your primary Cardiologist (physician) and Advanced Practice Providers (APPs- Physician Assistants and Nurse Practitioners) who all work together to provide you with the care you need, when you need it.   You may see any of the following providers on your designated Care Team at your next follow up: Dr Glori Bickers Dr Loralie Champagne Dr. Roxana Hires, NP Lyda Jester, [place] [facility] [place], [place] Forestine Na, NP Audry Riles, PharmD   Please be sure to bring in all your medications  bottles to every appointment.

## 2022-11-01 DIAGNOSIS — Z0000 Encounter for general adult medical examination without abnormal findings: Secondary | ICD-10-CM | POA: Diagnosis not present

## 2022-11-02 ENCOUNTER — Other Ambulatory Visit: Payer: Self-pay | Admitting: Family Medicine

## 2022-11-02 DIAGNOSIS — I5041 Acute combined systolic (congestive) and diastolic (congestive) heart failure: Secondary | ICD-10-CM | POA: Diagnosis not present

## 2022-11-02 DIAGNOSIS — E782 Mixed hyperlipidemia: Secondary | ICD-10-CM | POA: Diagnosis not present

## 2022-11-02 DIAGNOSIS — Z1231 Encounter for screening mammogram for malignant neoplasm of breast: Secondary | ICD-10-CM

## 2022-12-14 DIAGNOSIS — H2513 Age-related nuclear cataract, bilateral: Secondary | ICD-10-CM | POA: Diagnosis not present

## 2022-12-14 DIAGNOSIS — H40053 Ocular hypertension, bilateral: Secondary | ICD-10-CM | POA: Diagnosis not present

## 2022-12-14 DIAGNOSIS — E113293 Type 2 diabetes mellitus with mild nonproliferative diabetic retinopathy without macular edema, bilateral: Secondary | ICD-10-CM | POA: Diagnosis not present

## 2022-12-14 DIAGNOSIS — H16142 Punctate keratitis, left eye: Secondary | ICD-10-CM | POA: Diagnosis not present

## 2022-12-28 DIAGNOSIS — G4712 Idiopathic hypersomnia without long sleep time: Secondary | ICD-10-CM | POA: Diagnosis not present

## 2022-12-28 DIAGNOSIS — G4700 Insomnia, unspecified: Secondary | ICD-10-CM | POA: Diagnosis not present

## 2022-12-29 ENCOUNTER — Ambulatory Visit
Admission: RE | Admit: 2022-12-29 | Discharge: 2022-12-29 | Disposition: A | Discharge status: Home / Self Care | Payer: BC Managed Care – PPO | Source: Ambulatory Visit | Attending: Family Medicine | Admitting: Family Medicine

## 2022-12-29 DIAGNOSIS — Z1231 Encounter for screening mammogram for malignant neoplasm of breast: Secondary | ICD-10-CM | POA: Diagnosis not present

## 2023-01-11 ENCOUNTER — Telehealth (HOSPITAL_COMMUNITY): Payer: Self-pay

## 2023-01-11 ENCOUNTER — Other Ambulatory Visit (HOSPITAL_COMMUNITY): Payer: Self-pay

## 2023-01-11 NOTE — Telephone Encounter (Signed)
Yes ma'am, Stef is going to submit her PA for this medication. Thanks

## 2023-01-11 NOTE — Telephone Encounter (Signed)
Renee Howell called and stated her pharmacy told her that her Corlanor is being denied by her insurance. Can you check on this for her?

## 2023-01-11 NOTE — Telephone Encounter (Signed)
[name] and gave her updated information.

## 2023-01-12 ENCOUNTER — Other Ambulatory Visit (HOSPITAL_COMMUNITY): Payer: Self-pay

## 2023-01-12 ENCOUNTER — Telehealth (HOSPITAL_COMMUNITY): Payer: Self-pay

## 2023-01-12 NOTE — Telephone Encounter (Signed)
Advanced Heart Failure Patient Advocate Encounter  Prior authorization is required for Corlanor. PA submitted and APPROVED on 01/11/23.  Key [id number] Effective: 01/11/23 - 01/12/24  Left vm for patient regarding approval.  Clista Bernhardt, CPhT Rx Patient Advocate Phone: [phone]

## 2023-01-20 ENCOUNTER — Other Ambulatory Visit (HOSPITAL_COMMUNITY): Payer: Self-pay | Admitting: Internal Medicine

## 2023-01-31 ENCOUNTER — Other Ambulatory Visit (HOSPITAL_COMMUNITY): Payer: Self-pay | Admitting: Internal Medicine

## 2023-01-31 DIAGNOSIS — E7800 Pure hypercholesterolemia, unspecified: Secondary | ICD-10-CM | POA: Diagnosis not present

## 2023-01-31 DIAGNOSIS — I10 Essential (primary) hypertension: Secondary | ICD-10-CM | POA: Diagnosis not present

## 2023-01-31 DIAGNOSIS — Z9641 Presence of insulin pump (external) (internal): Secondary | ICD-10-CM | POA: Diagnosis not present

## 2023-01-31 DIAGNOSIS — E1165 Type 2 diabetes mellitus with hyperglycemia: Secondary | ICD-10-CM | POA: Diagnosis not present

## 2023-02-02 ENCOUNTER — Other Ambulatory Visit (HOSPITAL_COMMUNITY): Payer: Self-pay | Admitting: Internal Medicine

## 2023-03-24 DIAGNOSIS — M25512 Pain in left shoulder: Secondary | ICD-10-CM | POA: Diagnosis not present

## 2023-04-10 ENCOUNTER — Other Ambulatory Visit (HOSPITAL_COMMUNITY): Payer: Self-pay | Admitting: Internal Medicine

## 2023-04-13 ENCOUNTER — Other Ambulatory Visit (HOSPITAL_COMMUNITY): Payer: Self-pay

## 2023-04-13 DIAGNOSIS — L918 Other hypertrophic disorders of the skin: Secondary | ICD-10-CM | POA: Diagnosis not present

## 2023-04-13 DIAGNOSIS — Z85820 Personal history of malignant melanoma of skin: Secondary | ICD-10-CM | POA: Diagnosis not present

## 2023-04-13 DIAGNOSIS — D1723 Benign lipomatous neoplasm of skin and subcutaneous tissue of right leg: Secondary | ICD-10-CM | POA: Diagnosis not present

## 2023-04-13 DIAGNOSIS — L570 Actinic keratosis: Secondary | ICD-10-CM | POA: Diagnosis not present

## 2023-04-13 DIAGNOSIS — D0422 Carcinoma in situ of skin of left ear and external auricular canal: Secondary | ICD-10-CM | POA: Diagnosis not present

## 2023-04-13 DIAGNOSIS — H61001 Unspecified perichondritis of right external ear: Secondary | ICD-10-CM | POA: Diagnosis not present

## 2023-04-14 ENCOUNTER — Other Ambulatory Visit (HOSPITAL_COMMUNITY): Payer: Self-pay

## 2023-05-10 ENCOUNTER — Telehealth (HOSPITAL_COMMUNITY): Payer: Self-pay | Admitting: Cardiology

## 2023-05-10 NOTE — Telephone Encounter (Signed)
Patient called to request an appt for syncope episode x 2 Seen by pcp and recommended cardiology f/u  F/u 7/1 @ 3 after pts vacation

## 2023-05-28 NOTE — Progress Notes (Signed)
Advanced Heart Failure Clinic Note   Primary Cardiologist: Dr. Gala Romney   HPI: Renee Howell is a 57 y.o. female with a h/o obesity, HTN. ADD and poorly-controlled DM2, chronic systolic CHF (EF 16-10%).   Diagnosed in June 2018 with NICM, systolic CHF. Found to have an EF of 15-20%. Was markedly overloaded. Admitted Required milrinone for low output HF. Diuresed over 20 pounds. RHC showed Fick output/index 4.4/2.2 on 0.125 mcg Milrinone. Cardiac MRI without infiltrative disease. Milrinone weaned off. It was felt that her cardiomyopathy was related to either undiagnosed sleep apnea, viral or tachy induced (she has been on stimulants for ADD for a long time). Started on corlanor for tachycardia, although tachycardia likely represented the low output HF as well. Her stimulants were stopped at discharge. Discharge weight was 197 pounds.    Echo 3/21 EF 55-60% Grade I DD. RV normal.   Echo 9/22 EF 60-65%. RV ok   Today she returns for AHF follow up with her husband after experiencing syncope 6 times since October, last time was early June. Overall feeling ok. No edema, or PND/Orthopnea. Chest pressure associated with anxiety. Feels dizzy daily, has to stand slowly and not move to fast when changing positions. Feels palpitations every now and then. Occasional SOB when going up and down stairs and outside in the heat. Appetite good. No fever or chills. Weight at home 138-142 pounds. Taking all medications. Denies smoking. Rare ETOH use. 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   Cardiac studies reviewed:  Echo 9/22 EF 60-65%. RV ok  Echo 3/21 EF 55-60% Grade I DD. RV normal.  Echo 1/19  EF 50-55% RV normal  Echo (bedside)11/18 30-35% RV ok.   Past Medical History:  Diagnosis Date   Allergy    CHF (congestive heart failure) (HCC)    Diabetes mellitus    Hypertension    Kidney infection    Ovarian  cancer (HCC)    Sleep apnea     Current Outpatient Medications  Medication Sig Dispense Refill   acetaminophen (TYLENOL) 325 MG tablet Take 650 mg by mouth every 6 (six) hours as needed.     atorvastatin (LIPITOR) 10 MG tablet TAKE 1 TABLET BY MOUTH EVERY DAY 90 tablet 3   calcium citrate-vitamin D (CITRACAL+D) 315-200 MG-UNIT tablet Take 1 tablet by mouth 2 (two) times daily.     carvedilol (COREG) 6.25 MG tablet TAKE 1 TABLET BY MOUTH TWICE A DAY WITH FOOD 180 tablet 3   cholecalciferol (VITAMIN D) 1000 units tablet Take 2,000 Units by mouth daily.     CORLANOR 5 MG TABS tablet TAKE 1 TABLET BY MOUTH TWICE A DAY WITH FOOD 180 tablet 3   DULoxetine (CYMBALTA) 30 MG capsule Take 90 mg by mouth daily.     fluticasone (FLONASE) 50 MCG/ACT nasal spray Place 2 sprays into both nostrils daily.     furosemide (LASIX) 20 MG tablet TAKE 1 TABLET DAILY AS NEEDED, WEIGHT GAIN GREATER THAN 3 POUNDS IN 1 DAY OR GREATER THAN 5 POUNDS IN 1 WEEK 90 tablet 3   Insulin Human (INSULIN PUMP) SOLN Inject into the skin. Sliding scale     JARDIANCE 10 MG TABS tablet TAKE 1 TABLET BY MOUTH EVERY DAY 90 tablet 3   losartan (COZAAR) 25 MG tablet Take 0.5 tablets (12.5 mg total) by mouth daily. 45 tablet 3   Melatonin 1 MG TABS Take 5 mg by  mouth at bedtime as needed.      metFORMIN (GLUCOPHAGE) 1000 MG tablet Take 1,000 mg by mouth 2 (two) times daily.     montelukast (SINGULAIR) 10 MG tablet Take 10 mg by mouth daily.  0   OZEMPIC, 0.25 OR 0.5 MG/DOSE, 2 MG/1.5ML SOPN Inject into the skin.     pantoprazole (PROTONIX) 20 MG tablet TK 1 T PO QD     ranitidine (ZANTAC) 150 MG tablet Take 150 mg by mouth daily as needed.     spironolactone (ALDACTONE) 25 MG tablet TAKE 1 TABLET BY MOUTH EVERY DAY 90 tablet 3   Misc Natural Products (OSTEO BI-FLEX ADV JOINT SHIELD PO) Take by mouth daily. (Patient not taking: Reported on 05/29/2023)     Solriamfetol HCl (SUNOSI) 75 MG TABS 1 tablet in the morning (Patient not taking:  Reported on 05/29/2023)     zaleplon (SONATA) 5 MG capsule 1-2 caps for sleep at night as needed (Patient not taking: Reported on 05/29/2023) 30 capsule 2   No current facility-administered medications for this encounter.    Allergies  Allergen Reactions   Trulicity [Dulaglutide] Other (See Comments)    Pt states that the trulicity makes her feel horrible three days after she uses it. States she will never take it again.    Latex Rash   Social History   Socioeconomic History   Marital status: Married    Spouse name: Not on file   Number of children: 2   Years of education: Not on file   Highest education level: Not on file  Occupational History   Occupation: unemployed  Tobacco Use   Smoking status: Never   Smokeless tobacco: Never  Vaping Use   Vaping Use: Never used  Substance and Sexual Activity   Alcohol use: Yes    Alcohol/week: 3.0 standard drinks of alcohol    Types: 2 Glasses of wine, 1 Standard drinks or equivalent per week    Comment: occ   Drug use: No   Sexual activity: Not on file  Other Topics Concern   Not on file  Social History Narrative   Right Handed    Lives in a two story home    Social Determinants of Health   Financial Resource Strain: Not on file  Food Insecurity: Not on file  Transportation Needs: Not on file  Physical Activity: Not on file  Stress: Not on file  Social Connections: Not on file  Intimate Partner Violence: Not on file   Family History  Problem Relation Age of Onset   Cancer Mother    Diabetes Father    Bipolar disorder Brother    Dementia Brother    Diabetes Brother    Breast cancer Paternal Grandmother    Vitals:   05/29/23 1459 05/29/23 1502 05/29/23 1503  SpO2: 99% 99% 99%  Weight: 65.8 kg (145 lb)    Height: 5\' 5"  (1.651 m)     Orthostatic vitals: Supine BP: 120/69, Sitting 110/64, Standing 96/60  Wt Readings from Last 3 Encounters:  05/29/23 65.8 kg (145 lb)  09/22/22 79.4 kg (175 lb)  03/11/22 97.1 kg (214  lb)   PHYSICAL EXAM: General:  well appearing.  No respiratory difficulty HEENT: normal Neck: supple. JVD flat. Carotids 2+ bilat; no bruits. No lymphadenopathy or thyromegaly appreciated. Cor: PMI nondisplaced. Regular rate & rhythm. No rubs, gallops or murmurs. Lungs: clear Abdomen: soft, nontender, nondistended. No hepatosplenomegaly. No bruits or masses. Good bowel sounds. Extremities: no cyanosis, clubbing, rash, edema  Neuro: alert & oriented x 3, cranial nerves grossly intact. moves all 4 extremities w/o difficulty. Affect pleasant.   ECG: NSR 70s (Personally reviewed)    ReDS 24%  ASSESSMENT & PLAN: Syncope - orthostatic vitals +, see above - EKG NSR 70s - ReDS clip 24%, volume low - Zio in 2019 with brief SVT/NSVT runs, will order 1 week zio today - No carotid bruits on exam - update echo - wear compression socks - encouraged to drink more fluid throughout the day. Only drinks 2 16 oz water bottles and a small diet coke each day.  2. Chronic systolic CHF with recovered EF: Echo EF 15% with biventricular dysfunction. 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 DM. Normal cors by cath in 04/2017. Cardiac MRI without infiltrative disease. 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 RV normal. - Complete recovery with medical therapy - Echo 02/26/20 EF 55-60% Grade I DD. RV normal.  - Echo 9/22 EF 60-65% - Stable NYHA II-early III - Volume status on the lower side today.  - Continue spiro 25 mg daily - Stop Entresto and switch to losartan 12.5mg  daily - Continue Jardiance 10 daily. 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  - Will update echo as above - BMET, BNP today  3. DM - On insulin pump and Dexcom. HgbA1c 6.1 - Continue  Jardiance   4. ADD - No longer on stimulants. Would avoid in future if possible   5. Mild OSA with idiopathic insomnia -  Follows with Dr. Earl Gala. 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   6. Sinus tach - Much improved on corlanor 5 mg BID and carvedilol. Will continue  7. Obesity  - Has lost 69 pounds with Ozempic  Follow up with APP in 3-4 weeks.   Alen Bleacher, NP 05/29/23

## 2023-05-29 ENCOUNTER — Ambulatory Visit (HOSPITAL_COMMUNITY)
Admission: RE | Admit: 2023-05-29 | Discharge: 2023-05-29 | Disposition: A | Discharge status: Home / Self Care | Payer: BC Managed Care – PPO | Source: Ambulatory Visit | Attending: Internal Medicine | Admitting: Internal Medicine

## 2023-05-29 ENCOUNTER — Inpatient Hospital Stay (HOSPITAL_COMMUNITY)
Admission: RE | Admit: 2023-05-29 | Discharge: 2023-05-29 | Disposition: A | Discharge status: Home / Self Care | Payer: BC Managed Care – PPO | Source: Ambulatory Visit | Attending: Internal Medicine | Admitting: Internal Medicine

## 2023-05-29 ENCOUNTER — Other Ambulatory Visit (HOSPITAL_COMMUNITY): Payer: Self-pay | Admitting: Internal Medicine

## 2023-05-29 ENCOUNTER — Encounter (HOSPITAL_COMMUNITY): Payer: Self-pay

## 2023-05-29 VITALS — Ht 65.0 in | Wt 145.0 lb

## 2023-05-29 DIAGNOSIS — R000 Tachycardia, unspecified: Secondary | ICD-10-CM | POA: Insufficient documentation

## 2023-05-29 DIAGNOSIS — I5022 Chronic systolic (congestive) heart failure: Secondary | ICD-10-CM | POA: Insufficient documentation

## 2023-05-29 DIAGNOSIS — I110 Hypertensive heart disease with heart failure: Secondary | ICD-10-CM | POA: Insufficient documentation

## 2023-05-29 DIAGNOSIS — Z833 Family history of diabetes mellitus: Secondary | ICD-10-CM | POA: Insufficient documentation

## 2023-05-29 DIAGNOSIS — Z9641 Presence of insulin pump (external) (internal): Secondary | ICD-10-CM | POA: Insufficient documentation

## 2023-05-29 DIAGNOSIS — R55 Syncope and collapse: Secondary | ICD-10-CM

## 2023-05-29 DIAGNOSIS — E119 Type 2 diabetes mellitus without complications: Secondary | ICD-10-CM | POA: Diagnosis not present

## 2023-05-29 DIAGNOSIS — G4733 Obstructive sleep apnea (adult) (pediatric): Secondary | ICD-10-CM | POA: Insufficient documentation

## 2023-05-29 DIAGNOSIS — Z79899 Other long term (current) drug therapy: Secondary | ICD-10-CM | POA: Insufficient documentation

## 2023-05-29 DIAGNOSIS — F988 Other specified behavioral and emotional disorders with onset usually occurring in childhood and adolescence: Secondary | ICD-10-CM

## 2023-05-29 DIAGNOSIS — E669 Obesity, unspecified: Secondary | ICD-10-CM | POA: Diagnosis not present

## 2023-05-29 DIAGNOSIS — I428 Other cardiomyopathies: Secondary | ICD-10-CM | POA: Insufficient documentation

## 2023-05-29 DIAGNOSIS — Z7984 Long term (current) use of oral hypoglycemic drugs: Secondary | ICD-10-CM | POA: Diagnosis not present

## 2023-05-29 DIAGNOSIS — Z794 Long term (current) use of insulin: Secondary | ICD-10-CM | POA: Diagnosis not present

## 2023-05-29 DIAGNOSIS — F5101 Primary insomnia: Secondary | ICD-10-CM | POA: Diagnosis not present

## 2023-05-29 LAB — BASIC METABOLIC PANEL
Anion gap: 7 (ref 5–15)
BUN: 26 mg/dL — ABNORMAL HIGH (ref 6–20)
CO2: 26 mmol/L (ref 22–32)
Calcium: 9.5 mg/dL (ref 8.9–10.3)
Chloride: 102 mmol/L (ref 98–111)
Creatinine, Ser: 0.83 mg/dL (ref 0.44–1.00)
GFR, Estimated: >60 mL/min (ref >60)
Glucose, Bld: 103 mg/dL — ABNORMAL HIGH (ref 70–99)
Potassium: 4.8 mmol/L (ref 3.5–5.1)
Sodium: 135 mmol/L (ref 135–145)

## 2023-05-29 LAB — BRAIN NATRIURETIC PEPTIDE: B Natriuretic Peptide: 17.2 pg/mL (ref 0.0–100.0)

## 2023-05-29 MED ORDER — ENTRESTO 24-26 MG PO TABS: 1.0000 | ORAL_TABLET | Freq: Two times a day (BID) | ORAL | 3 refills | Status: DC

## 2023-05-29 MED ORDER — LOSARTAN POTASSIUM 25 MG PO TABS: 12.5000 mg | ORAL_TABLET | Freq: Every day | ORAL | 3 refills | Status: DC

## 2023-05-29 NOTE — Progress Notes (Signed)
Zio patch placed onto patient.  All instructions and information reviewed with patient, they verbalize understanding with no questions. 

## 2023-05-29 NOTE — Patient Instructions (Addendum)
Start Losartan 12.5 mg daily. Stop Entresto. Labs today - will call you if abnormal. 7 day Zio patch (heart monitor) placed today - see below.  Please wear compression socks - Rx provided. Return to Heart Failure APP Clinic in 3 - 4 weeks - see appointment details below. Please call us at [phone] if you have any questions or concerns prior to next visit.    Your provider has recommended that  you wear a Zio Patch for seven days.  This monitor will record your heart rhythm for our review.  IF you have any symptoms while wearing the monitor please press the button.  If you have any issues with the patch or you notice a red or orange light on it please call the company at [phone].  Once you remove the patch please mail it back to the company as soon as possible so we can get the results.

## 2023-05-29 NOTE — Progress Notes (Signed)
ReDS Vest / Clip - 05/29/23 1459       ReDS Vest / Clip   Station Marker A    Ruler Value 31    ReDS Value Range Low volume    ReDS Actual Value 24

## 2023-06-05 DIAGNOSIS — C44229 Squamous cell carcinoma of skin of left ear and external auricular canal: Secondary | ICD-10-CM | POA: Diagnosis not present

## 2023-06-20 NOTE — Progress Notes (Signed)
Advanced Heart Failure Clinic Note   Primary Cardiologist: Dr. Gala Romney   HPI: ELLYN Howell is a 57 y.o. female with a h/o obesity, HTN. ADD and poorly-controlled DM2, chronic systolic CHF (EF 59-56%).   Diagnosed in June 2018 with NICM, systolic CHF. Found to have an EF of 15-20%. Was markedly overloaded. Admitted Required milrinone for low output HF. Diuresed over 20 pounds. RHC showed Fick output/index 4.4/2.2 on 0.125 mcg Milrinone. Cardiac MRI without infiltrative disease. Milrinone weaned off. It was felt that her cardiomyopathy was related to either undiagnosed sleep apnea, viral or tachy induced (she has been on stimulants for ADD for a long time). Started on corlanor for tachycardia, although tachycardia likely represented the low output HF as well. Her stimulants were stopped at discharge. Discharge weight was 197 pounds.    Echo 3/21 EF 55-60% Grade I DD. RV normal.   Echo 9/22 EF 60-65%. RV ok   Today she returns for AHF follow up with her husband after experiencing syncope 6 times since October, last time was early June. Overall feeling ok. No edema, or PND/Orthopnea. Chest pressure associated with anxiety. Feels dizzy daily, has to stand slowly and not move to fast when changing positions. Feels palpitations every now and then. Occasional SOB when going up and down stairs and outside in the heat. Appetite good. No fever or chills. Weight at home 138-142 pounds. Taking all medications. Denies smoking. Rare ETOH use. 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   Cardiac studies reviewed:  Echo 9/22 EF 60-65%. RV ok  Echo 3/21 EF 55-60% Grade I DD. RV normal.  Echo 1/19  EF 50-55% RV normal  Echo (bedside)11/18 30-35% RV ok.   Past Medical History:  Diagnosis Date   Allergy    CHF (congestive heart failure) (HCC)    Diabetes mellitus    Hypertension    Kidney infection    Ovarian  cancer (HCC)    Sleep apnea     Current Outpatient Medications  Medication Sig Dispense Refill   acetaminophen (TYLENOL) 325 MG tablet Take 650 mg by mouth every 6 (six) hours as needed.     atorvastatin (LIPITOR) 10 MG tablet TAKE 1 TABLET BY MOUTH EVERY DAY 90 tablet 3   calcium citrate-vitamin D (CITRACAL+D) 315-200 MG-UNIT tablet Take 1 tablet by mouth 2 (two) times daily.     carvedilol (COREG) 6.25 MG tablet TAKE 1 TABLET BY MOUTH TWICE A DAY WITH FOOD 180 tablet 3   cholecalciferol (VITAMIN D) 1000 units tablet Take 2,000 Units by mouth daily.     CORLANOR 5 MG TABS tablet TAKE 1 TABLET BY MOUTH TWICE A DAY WITH FOOD 180 tablet 3   DULoxetine (CYMBALTA) 30 MG capsule Take 90 mg by mouth daily.     fluticasone (FLONASE) 50 MCG/ACT nasal spray Place 2 sprays into both nostrils daily.     furosemide (LASIX) 20 MG tablet TAKE 1 TABLET DAILY AS NEEDED, WEIGHT GAIN GREATER THAN 3 POUNDS IN 1 DAY OR GREATER THAN 5 POUNDS IN 1 WEEK 90 tablet 3   Insulin Human (INSULIN PUMP) SOLN Inject into the skin. Sliding scale     JARDIANCE 10 MG TABS tablet TAKE 1 TABLET BY MOUTH EVERY DAY 90 tablet 3   losartan (COZAAR) 25 MG tablet Take 0.5 tablets (12.5 mg total) by mouth daily. 45 tablet 3   Melatonin 1 MG TABS Take 5 mg by  mouth at bedtime as needed.      metFORMIN (GLUCOPHAGE) 1000 MG tablet Take 1,000 mg by mouth 2 (two) times daily.     Misc Natural Products (OSTEO BI-FLEX ADV JOINT SHIELD PO) Take by mouth daily. (Patient not taking: Reported on 05/29/2023)     montelukast (SINGULAIR) 10 MG tablet Take 10 mg by mouth daily.  0   OZEMPIC, 0.25 OR 0.5 MG/DOSE, 2 MG/1.5ML SOPN Inject into the skin.     pantoprazole (PROTONIX) 20 MG tablet TK 1 T PO QD     ranitidine (ZANTAC) 150 MG tablet Take 150 mg by mouth daily as needed.     Solriamfetol HCl (SUNOSI) 75 MG TABS 1 tablet in the morning (Patient not taking: Reported on 05/29/2023)     spironolactone (ALDACTONE) 25 MG tablet TAKE 1 TABLET BY MOUTH  EVERY DAY 90 tablet 3   zaleplon (SONATA) 5 MG capsule 1-2 caps for sleep at night as needed (Patient not taking: Reported on 05/29/2023) 30 capsule 2   No current facility-administered medications for this visit.    Allergies  Allergen Reactions   Trulicity [Dulaglutide] Other (See Comments)    Pt states that the trulicity makes her feel horrible three days after she uses it. States she will never take it again.    Latex Rash   Social History   Socioeconomic History   Marital status: Married    Spouse name: Not on file   Number of children: 2   Years of education: Not on file   Highest education level: Not on file  Occupational History   Occupation: unemployed  Tobacco Use   Smoking status: Never   Smokeless tobacco: Never  Vaping Use   Vaping status: Never Used  Substance and Sexual Activity   Alcohol use: Yes    Alcohol/week: 3.0 standard drinks of alcohol    Types: 2 Glasses of wine, 1 Standard drinks or equivalent per week    Comment: occ   Drug use: No   Sexual activity: Not on file  Other Topics Concern   Not on file  Social History Narrative   Right Handed    Lives in a two story home    Social Determinants of Health   Financial Resource Strain: Not on file  Food Insecurity: Not on file  Transportation Needs: Not on file  Physical Activity: Not on file  Stress: Not on file  Social Connections: Not on file  Intimate Partner Violence: Not on file   Family History  Problem Relation Age of Onset   Cancer Mother    Diabetes Father    Bipolar disorder Brother    Dementia Brother    Diabetes Brother    Breast cancer Paternal Grandmother    There were no vitals filed for this visit.  Orthostatic vitals: Supine BP: 120/69, Sitting 110/64, Standing 96/60  Wt Readings from Last 3 Encounters:  05/29/23 65.8 kg (145 lb)  09/22/22 79.4 kg (175 lb)  03/11/22 97.1 kg (214 lb)   PHYSICAL EXAM: General:  well appearing.  No respiratory difficulty HEENT:  normal Neck: supple. JVD flat. Carotids 2+ bilat; no bruits. No lymphadenopathy or thyromegaly appreciated. Cor: PMI nondisplaced. Regular rate & rhythm. No rubs, gallops or murmurs. Lungs: clear Abdomen: soft, nontender, nondistended. No hepatosplenomegaly. No bruits or masses. Good bowel sounds. Extremities: no cyanosis, clubbing, rash, edema  Neuro: alert & oriented x 3, cranial nerves grossly intact. moves all 4 extremities w/o difficulty. Affect pleasant.   ECG: NSR  70s (Personally reviewed)    ReDS 24%  ASSESSMENT & PLAN: Syncope - orthostatic vitals +, see above - EKG NSR 70s - ReDS clip 24%, volume low - Zio in 2019 with brief SVT/NSVT runs, will order 1 week zio today - No carotid bruits on exam - update echo - wear compression socks - encouraged to drink more fluid throughout the day. Only drinks 2 16 oz water bottles and a small diet coke each day.  2. Chronic systolic CHF with recovered EF: Echo EF 15% with biventricular dysfunction. 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 DM. Normal cors by cath in 04/2017. Cardiac MRI without infiltrative disease. 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 RV normal. - Complete recovery with medical therapy - Echo 02/26/20 EF 55-60% Grade I DD. RV normal.  - Echo 9/22 EF 60-65% - Stable NYHA II-early III - Volume status on the lower side today.  - Continue spiro 25 mg daily - Stop Entresto and switch to losartan 12.5mg  daily - Continue Jardiance 10 daily. 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  - Will update echo as above - BMET, BNP today  3. DM - On insulin pump and Dexcom. HgbA1c 6.1 - Continue Jardiance   4. ADD - No longer on stimulants. Would avoid in future if possible   5.  Mild OSA with idiopathic insomnia -  Follows with Dr. Earl Gala. 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   6. Sinus tach - Much improved on corlanor 5 mg BID and carvedilol. Will continue  7. Obesity  - Has lost 69 pounds with Ozempic  Follow up with APP in 3-4 weeks.   Anderson Malta [place], FNP 06/20/23

## 2023-06-21 ENCOUNTER — Ambulatory Visit (HOSPITAL_COMMUNITY): Admission: RE | Admit: 2023-06-21 | Payer: BC Managed Care – PPO | Source: Ambulatory Visit

## 2023-06-21 ENCOUNTER — Encounter (HOSPITAL_COMMUNITY): Payer: Self-pay

## 2023-06-21 ENCOUNTER — Ambulatory Visit (HOSPITAL_COMMUNITY)
Admission: RE | Admit: 2023-06-21 | Discharge: 2023-06-21 | Disposition: A | Discharge status: Home / Self Care | Payer: BC Managed Care – PPO | Source: Ambulatory Visit | Attending: Family Medicine | Admitting: Family Medicine

## 2023-06-21 VITALS — BP 116/68 | HR 74 | Wt 145.8 lb

## 2023-06-21 DIAGNOSIS — G4733 Obstructive sleep apnea (adult) (pediatric): Secondary | ICD-10-CM

## 2023-06-21 DIAGNOSIS — F988 Other specified behavioral and emotional disorders with onset usually occurring in childhood and adolescence: Secondary | ICD-10-CM

## 2023-06-21 DIAGNOSIS — E119 Type 2 diabetes mellitus without complications: Secondary | ICD-10-CM

## 2023-06-21 DIAGNOSIS — R55 Syncope and collapse: Secondary | ICD-10-CM | POA: Diagnosis not present

## 2023-06-21 DIAGNOSIS — I110 Hypertensive heart disease with heart failure: Secondary | ICD-10-CM | POA: Diagnosis not present

## 2023-06-21 DIAGNOSIS — I5041 Acute combined systolic (congestive) and diastolic (congestive) heart failure: Secondary | ICD-10-CM | POA: Insufficient documentation

## 2023-06-21 DIAGNOSIS — I081 Rheumatic disorders of both mitral and tricuspid valves: Secondary | ICD-10-CM | POA: Diagnosis not present

## 2023-06-21 DIAGNOSIS — I5022 Chronic systolic (congestive) heart failure: Secondary | ICD-10-CM | POA: Diagnosis not present

## 2023-06-21 DIAGNOSIS — I472 Ventricular tachycardia, unspecified: Secondary | ICD-10-CM | POA: Diagnosis not present

## 2023-06-21 DIAGNOSIS — Z794 Long term (current) use of insulin: Secondary | ICD-10-CM

## 2023-06-21 DIAGNOSIS — R000 Tachycardia, unspecified: Secondary | ICD-10-CM

## 2023-06-21 LAB — ECHOCARDIOGRAM COMPLETE
AR max vel: 3.04 cm2
AV Area VTI: 3.09 cm2
AV Area mean vel: 2.79 cm2
AV Mean grad: 2.5 mmHg
AV Peak grad: 4.3 mmHg
Ao pk vel: 1.04 m/s
Area-P 1/2: 2.78 cm2
Calc EF: 51.4 %
MV VTI: 3.75 cm2
S' Lateral: 3.1 cm
Single Plane A2C EF: 50.2 %
Single Plane A4C EF: 52 %

## 2023-06-21 NOTE — Patient Instructions (Signed)
Medication Changes: No Changes In Medications at this time.   Follow-Up in: 12 MONTHS WITH DR. Gala Romney PLEASE CALL OUR OFFICE AROUND MAY 2025 TO GET SCHEDULED FOR YOUR APPOINTMENT. PHONE NUMBER IS [phone] OPTION 2    At the [facility], you and your health needs are our priority. We have a designated team specialized in the treatment of Heart Failure. This Care Team includes your primary Heart Failure Specialized Cardiologist (physician), Advanced Practice Providers (APPs- Physician Assistants and Nurse Practitioners), and Pharmacist who all work together to provide you with the care you need, when you need it.   You may see any of the following providers on your designated Care Team at your next follow up:  Dr. Arvilla Meres Dr. Marca Ancona Dr. Marcos Eke, NP Robbie Lis, [place] [facility], [place] Brynda Peon, NP Karle Plumber, PharmD   Please be sure to bring in all your medications bottles to every appointment.   Need to Contact us:  If you have any questions or concerns before your next appointment please send [place] a message through [place] or call our office at [phone].    TO LEAVE A MESSAGE FOR THE NURSE SELECT OPTION 2, PLEASE LEAVE A MESSAGE INCLUDING: YOUR NAME DATE OF BIRTH CALL BACK NUMBER REASON FOR CALL**this is important as we prioritize the call backs  YOU WILL RECEIVE A CALL BACK THE SAME DAY AS LONG AS YOU CALL BEFORE 4:00 PM

## 2023-06-22 NOTE — Addendum Note (Signed)
Encounter addended by: Crissie Figures, RN on: 06/22/2023 10:30 AM  Actions taken: Imaging Exam ended

## 2023-06-22 NOTE — Addendum Note (Signed)
Encounter addended by: Crissie Figures, RN on: 06/22/2023 10:33 AM  Actions taken: Imaging Exam ended

## 2023-07-10 ENCOUNTER — Emergency Department (HOSPITAL_BASED_OUTPATIENT_CLINIC_OR_DEPARTMENT_OTHER): Payer: BC Managed Care – PPO

## 2023-07-10 ENCOUNTER — Encounter (HOSPITAL_BASED_OUTPATIENT_CLINIC_OR_DEPARTMENT_OTHER): Payer: Self-pay

## 2023-07-10 ENCOUNTER — Emergency Department (HOSPITAL_BASED_OUTPATIENT_CLINIC_OR_DEPARTMENT_OTHER)
Admission: EM | Admit: 2023-07-10 | Discharge: 2023-07-11 | Disposition: A | Discharge status: Home / Self Care | Payer: BC Managed Care – PPO

## 2023-07-10 ENCOUNTER — Other Ambulatory Visit: Payer: Self-pay

## 2023-07-10 ENCOUNTER — Emergency Department (HOSPITAL_COMMUNITY): Payer: BC Managed Care – PPO

## 2023-07-10 DIAGNOSIS — R1030 Lower abdominal pain, unspecified: Secondary | ICD-10-CM | POA: Diagnosis not present

## 2023-07-10 DIAGNOSIS — R112 Nausea with vomiting, unspecified: Secondary | ICD-10-CM | POA: Diagnosis not present

## 2023-07-10 DIAGNOSIS — Z91040 Latex allergy status: Secondary | ICD-10-CM | POA: Diagnosis not present

## 2023-07-10 DIAGNOSIS — R9082 White matter disease, unspecified: Secondary | ICD-10-CM | POA: Diagnosis not present

## 2023-07-10 DIAGNOSIS — Z7984 Long term (current) use of oral hypoglycemic drugs: Secondary | ICD-10-CM | POA: Diagnosis not present

## 2023-07-10 DIAGNOSIS — Z794 Long term (current) use of insulin: Secondary | ICD-10-CM | POA: Insufficient documentation

## 2023-07-10 DIAGNOSIS — R1110 Vomiting, unspecified: Secondary | ICD-10-CM | POA: Diagnosis not present

## 2023-07-10 DIAGNOSIS — I110 Hypertensive heart disease with heart failure: Secondary | ICD-10-CM | POA: Diagnosis not present

## 2023-07-10 DIAGNOSIS — R109 Unspecified abdominal pain: Secondary | ICD-10-CM | POA: Diagnosis not present

## 2023-07-10 DIAGNOSIS — Z79899 Other long term (current) drug therapy: Secondary | ICD-10-CM | POA: Diagnosis not present

## 2023-07-10 DIAGNOSIS — E119 Type 2 diabetes mellitus without complications: Secondary | ICD-10-CM | POA: Diagnosis not present

## 2023-07-10 DIAGNOSIS — I509 Heart failure, unspecified: Secondary | ICD-10-CM | POA: Insufficient documentation

## 2023-07-10 DIAGNOSIS — R42 Dizziness and giddiness: Secondary | ICD-10-CM

## 2023-07-10 LAB — BASIC METABOLIC PANEL
Anion gap: 10 (ref 5–15)
BUN: 24 mg/dL — ABNORMAL HIGH (ref 6–20)
CO2: 26 mmol/L (ref 22–32)
Calcium: 9.8 mg/dL (ref 8.9–10.3)
Chloride: 104 mmol/L (ref 98–111)
Creatinine, Ser: 0.78 mg/dL (ref 0.44–1.00)
GFR, Estimated: >60 mL/min (ref >60)
Glucose, Bld: 129 mg/dL — ABNORMAL HIGH (ref 70–99)
Potassium: 4.9 mmol/L (ref 3.5–5.1)
Sodium: 140 mmol/L (ref 135–145)

## 2023-07-10 LAB — URINALYSIS, MICROSCOPIC (REFLEX)

## 2023-07-10 LAB — URINALYSIS, ROUTINE W REFLEX MICROSCOPIC
Bilirubin Urine: NEGATIVE
Glucose, UA: >=500 mg/dL — AB
Hgb urine dipstick: NEGATIVE
Ketones, ur: 40 mg/dL — AB
Leukocytes,Ua: NEGATIVE
Nitrite: NEGATIVE
Protein, ur: NEGATIVE mg/dL
Specific Gravity, Urine: 1.02 (ref 1.005–1.030)
pH: 6 (ref 5.0–8.0)

## 2023-07-10 LAB — CBC
HCT: 39.5 % (ref 36.0–46.0)
Hemoglobin: 12.7 g/dL (ref 12.0–15.0)
MCH: 27.8 pg (ref 26.0–34.0)
MCHC: 32.2 g/dL (ref 30.0–36.0)
MCV: 86.4 fL (ref 80.0–100.0)
Platelets: 293 10*3/uL (ref 150–400)
RBC: 4.57 MIL/uL (ref 3.87–5.11)
RDW: 13.6 % (ref 11.5–15.5)
WBC: 8.5 10*3/uL (ref 4.0–10.5)
nRBC: 0 % (ref 0.0–0.2)

## 2023-07-10 MED ORDER — MECLIZINE HCL 25 MG PO TABS
50.0000 mg | ORAL_TABLET | Freq: Once | ORAL | Status: AC
  Administered 2023-07-10: 50 mg via ORAL
  Filled 2023-07-10: qty 2

## 2023-07-10 MED ORDER — DIAZEPAM 5 MG PO TABS
5.0000 mg | ORAL_TABLET | Freq: Once | ORAL | Status: AC
  Administered 2023-07-10: 5 mg via ORAL
  Filled 2023-07-10: qty 1

## 2023-07-10 MED ORDER — DIAZEPAM 2 MG PO TABS: 2.0000 mg | ORAL_TABLET | Freq: Four times a day (QID) | ORAL | 0 refills | Status: AC | PRN

## 2023-07-10 MED ORDER — MECLIZINE HCL 25 MG PO TABS: 25.0000 mg | ORAL_TABLET | Freq: Three times a day (TID) | ORAL | 0 refills | Status: AC | PRN

## 2023-07-10 MED ORDER — LORAZEPAM 2 MG/ML IJ SOLN
1.0000 mg | Freq: Once | INTRAMUSCULAR | Status: AC
  Administered 2023-07-10: 1 mg via INTRAVENOUS
  Filled 2023-07-10: qty 1

## 2023-07-10 MED ORDER — IOHEXOL 350 MG/ML SOLN
100.0000 mL | Freq: Once | INTRAVENOUS | Status: AC | PRN
  Administered 2023-07-10: 75 mL via INTRAVENOUS

## 2023-07-10 NOTE — ED Triage Notes (Signed)
Pt c/o dizziness, unsteady gait and vomiting that started Saturday. Pt reports her head started feeling funny Saturday around 13:30 and has been constant since then. Pt reports the only time she doesn't feel bad is when she is lying down with her eyes closed. Pt reports she saw her PCP this morning and told her to come to the ED.

## 2023-07-10 NOTE — ED Provider Notes (Signed)
[facility] Provider Note   CSN: [id number] Arrival date & time: 07/10/23  1108     History  Chief Complaint  Patient presents with   Dizziness   Emesis    Renee Howell is a 57 y.o. female with past medical history significant for hypertension, diabetes, CHF presents to the ED complaining of dizziness and unsteady gait that began on Saturday.  She has associated nausea, vomiting, and posterior headache.  Patient reports that her head "started feeling funny" on Saturday around 1330 and has been constant since then.  She reports that her "head feels heavier than her neck can hold".  Her symptoms improve when she is lying down with her eyes closed.  Patient saw her PCP this morning who advised her to come to the ED for further workup and imaging.  She also has periumbilical abdominal pain that has been "off and on for awhile".  Denies history of vertigo.  Denies syncope, neck pain, numbness, weakness, speech difficulty, diarrhea, dysuria.        Home Medications Prior to Admission medications   Medication Sig Start Date End Date Taking? Authorizing Provider  acetaminophen (TYLENOL) 325 MG tablet Take 650 mg by mouth every 6 (six) hours as needed.    [provider]  atorvastatin (LIPITOR) 10 MG tablet TAKE 1 TABLET BY MOUTH EVERY DAY 02/02/23   Bensimhon, Bevelyn Buckles, MD  calcium citrate-vitamin D (CITRACAL+D) 315-200 MG-UNIT tablet Take 1 tablet by mouth 2 (two) times daily.    [provider]  carvedilol (COREG) 6.25 MG tablet TAKE 1 TABLET BY MOUTH TWICE A DAY WITH FOOD 01/31/23   Bensimhon, Bevelyn Buckles, MD  cholecalciferol (VITAMIN D) 1000 units tablet Take 2,000 Units by mouth daily.    [provider]  CORLANOR 5 MG TABS tablet TAKE 1 TABLET BY MOUTH TWICE A DAY WITH FOOD 04/11/23   Bensimhon, Bevelyn Buckles, MD  DULoxetine (CYMBALTA) 30 MG capsule Take 90 mg by mouth daily.    [provider]  fluticasone  (FLONASE) 50 MCG/ACT nasal spray Place 2 sprays into both nostrils daily.    [provider]  furosemide (LASIX) 20 MG tablet TAKE 1 TABLET DAILY AS NEEDED, WEIGHT GAIN GREATER THAN 3 POUNDS IN 1 DAY OR GREATER THAN 5 POUNDS IN 1 WEEK 09/27/21   Bensimhon, Bevelyn Buckles, MD  Insulin Human (INSULIN PUMP) SOLN Inject 55 each into the skin. Sliding scale    [provider]  JARDIANCE 10 MG TABS tablet TAKE 1 TABLET BY MOUTH EVERY DAY 01/31/23   Bensimhon, Bevelyn Buckles, MD  losartan (COZAAR) 25 MG tablet Take 0.5 tablets (12.5 mg total) by mouth daily. 05/29/23   Alen Bleacher, NP  Melatonin 1 MG TABS Take 5 mg by mouth at bedtime as needed.     [provider]  metFORMIN (GLUCOPHAGE) 1000 MG tablet Take 1,000 mg by mouth 2 (two) times daily.    [provider]  montelukast (SINGULAIR) 10 MG tablet Take 10 mg by mouth daily. 04/26/17   [provider]  OZEMPIC, 0.25 OR 0.5 MG/DOSE, 2 MG/1.5ML SOPN Inject into the skin once a week. 02/01/22   [provider]  pantoprazole (PROTONIX) 20 MG tablet TK 1 T PO QD 06/18/19   [provider]  ranitidine (ZANTAC) 150 MG tablet Take 150 mg by mouth daily as needed.    [provider]  spironolactone (ALDACTONE) 25 MG tablet TAKE 1 TABLET BY MOUTH  EVERY DAY 01/31/23   Bensimhon, Bevelyn Buckles, MD  zaleplon (SONATA) 5 MG capsule 1-2 caps for sleep at night as needed 06/15/17   Waymon Budge, MD      Allergies    Trulicity [dulaglutide] and Latex    Review of Systems   Review of Systems  Gastrointestinal:  Positive for nausea and vomiting.  Musculoskeletal:  Positive for gait problem (unsteady). Negative for neck pain.  Neurological:  Positive for dizziness and headaches. Negative for syncope, speech difficulty, weakness and numbness.    Physical Exam Updated Vital Signs BP 132/61   Pulse 65   Temp 98.3 F (36.8 C) (Oral)   Resp 19   Ht 5\' 5"  (1.651 m)   Wt 64 kg   SpO2 95%   BMI 23.46 kg/m   Physical Exam Vitals and nursing note reviewed.  Constitutional:      General: She is not in acute distress.    Appearance: Normal appearance. She is not ill-appearing or diaphoretic.  HENT:     Head: Normocephalic and atraumatic.     Right Ear: Tympanic membrane and ear canal normal.     Left Ear: Tympanic membrane and ear canal normal.     Mouth/Throat:     Lips: Pink.     Mouth: Mucous membranes are moist.     Pharynx: Oropharynx is clear.  Eyes:     General: Vision grossly intact. Gaze aligned appropriately. No visual field deficit.    Extraocular Movements:     Right eye: Nystagmus present. Normal extraocular motion.     Left eye: Nystagmus present. Normal extraocular motion.     Conjunctiva/sclera: Conjunctivae normal.     Pupils: Pupils are equal, round, and reactive to light.     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  Left beating horizontal nystagmus at rest, worse with eye movement.    Cardiovascular:     Rate and Rhythm: Normal rate and regular rhythm.  Pulmonary:     Effort: Pulmonary effort is normal.  Abdominal:     General: Abdomen is flat.     Palpations: Abdomen is soft.     Tenderness: There is abdominal tenderness in the periumbilical area.  Musculoskeletal:     Cervical back: Full passive range of motion without pain.  Skin:    General: Skin is warm and dry.     Capillary Refill: Capillary refill takes less than 2 seconds.  Neurological:     Mental Status: She is alert. Mental status is at baseline.     GCS: GCS eye subscore is 4. GCS verbal subscore is 5. GCS motor subscore is 6.     Cranial Nerves: No dysarthria or facial asymmetry.     Motor: No weakness, tremor or pronator drift.     Coordination: Coordination is intact.     Gait: Gait abnormal.     Comments: Cranial Nerves:  II: peripheral fields grossly intact III,IV, VI: ptosis present right eye only, extra-ocular movements intact bilaterally, direct and  consensual pupillary light reflexes intact bilaterally V: facial sensation, jaw opening, and bite strength equal bilaterally VII: eyebrow raise, eyelid close, smile, frown, pucker equal bilaterally VIII: hearing grossly normal bilaterally  IX,X: palate elevation and swallowing intact XI: bilateral shoulder shrug and lateral head rotation equal and strong XII: midline tongue extension Patient walks with very unsteady gait.  Unsteady movements with transitioning from lying to sitting to standing.   Motor: 5/5 strength in bilateral upper and  lower extremities.  Sensation grossly intact.   Psychiatric:        Mood and Affect: Mood normal.        Behavior: Behavior normal.     ED Results / Procedures / Treatments   Labs (all labs ordered are listed, but only abnormal results are displayed) Labs Reviewed  BASIC METABOLIC PANEL - Abnormal; Notable for the following components:      Result Value   Glucose, Bld 129 (*)    BUN 24 (*)    All other components within normal limits  URINALYSIS, ROUTINE W REFLEX MICROSCOPIC - Abnormal; Notable for the following components:   Glucose, UA >=500 (*)    Ketones, ur 40 (*)    All other components within normal limits  URINALYSIS, MICROSCOPIC (REFLEX) - Abnormal; Notable for the following components:   Bacteria, UA RARE (*)    All other components within normal limits  CBC    EKG EKG Interpretation Date/Time:  Monday July 10 2023 11:54:38 EDT Ventricular Rate:  84 PR Interval:  152 QRS Duration:  76 QT Interval:  367 QTC Calculation: 434 R Axis:   88  Text Interpretation: Sinus rhythm Anteroseptal infarct, old Confirmed by Estanislado Pandy [phone]) on 07/10/2023 1:11:05 PM  Radiology CT ABDOMEN PELVIS WO CONTRAST  Result Date: 07/10/2023 CLINICAL DATA:  Acute, nonlocalized abdominal pain with vomiting since yesterday. Dizziness since Saturday EXAM: CT ABDOMEN AND PELVIS WITHOUT CONTRAST TECHNIQUE: Multidetector CT imaging of the abdomen and  pelvis was performed following the standard protocol without IV contrast. RADIATION DOSE REDUCTION: This exam was performed according to the departmental dose-optimization program which includes automated exposure control, adjustment of the mA and/or kV according to patient size and/or use of iterative reconstruction technique. COMPARISON:  09/09/2016 FINDINGS: Lower chest:  Trace low-density pericardial fluid. Hepatobiliary: No focal liver abnormality.No evidence of biliary obstruction or stone. Pancreas: Unremarkable. Spleen: Unremarkable. Adrenals/Urinary Tract: Negative adrenals. No hydronephrosis or stone. Unremarkable bladder. Stomach/Bowel:  No obstruction. No appendicitis. Vascular/Lymphatic: No acute vascular abnormality. No mass or adenopathy. Reproductive:No pathologic findings. Other: No ascites or pneumoperitoneum. Musculoskeletal: No acute abnormalities. IMPRESSION: No acute finding.  No bowel obstruction or visible inflammation. Electronically Signed   By: Tiburcio Pea M.D.   On: 07/10/2023 15:12   CT ANGIO HEAD NECK W WO CM  Result Date: 07/10/2023 CLINICAL DATA:  Vertigo, central. EXAM: CT ANGIOGRAPHY HEAD AND NECK WITH AND WITHOUT CONTRAST TECHNIQUE: Multidetector CT imaging of the head and neck was performed using the standard protocol during bolus administration of intravenous contrast. Multiplanar CT image reconstructions and MIPs were obtained to evaluate the vascular anatomy. Carotid stenosis measurements (when applicable) are obtained utilizing NASCET criteria, using the distal internal carotid diameter as the denominator. RADIATION DOSE REDUCTION: This exam was performed according to the departmental dose-optimization program which includes automated exposure control, adjustment of the mA and/or kV according to patient size and/or use of iterative reconstruction technique. CONTRAST:  75mL OMNIPAQUE IOHEXOL 350 MG/ML SOLN COMPARISON:  None Available. FINDINGS: CT HEAD FINDINGS Brain:  No evidence of acute infarction, hemorrhage, hydrocephalus, extra-axial collection or mass lesion/mass effect. Mild bilateral occipital lobe hypodensity is thought to be artifactual. Vascular: No hyperdense vessel or unexpected calcification. Skull: Normal. Negative for fracture or focal lesion. Sinuses/Orbits: No acute finding. Other: None. Review of the MIP images confirms the above findings CTA NECK FINDINGS Aortic arch: Standard branching. Imaged portion shows no evidence of aneurysm or dissection. No significant stenosis of the major arch vessel origins. Right carotid  system: No evidence of dissection, stenosis (50% or greater), or occlusion. Left carotid system: No evidence of dissection, stenosis (50% or greater), or occlusion. Vertebral arteries: No evidence of dissection, stenosis (50% or greater), or occlusion. Skeleton: Negative. Other neck: Negative. Upper chest: Negative. Review of the MIP images confirms the above findings CTA HEAD FINDINGS Anterior circulation: No significant stenosis, proximal occlusion, aneurysm, or vascular malformation. Posterior circulation: No significant stenosis, proximal occlusion, aneurysm, or vascular malformation. Venous sinuses: As permitted by contrast timing, patent. Anatomic variants: None significant. Review of the MIP images confirms the above findings IMPRESSION: 1. No evidence of acute intracranial abnormality. 2. No intracranial large vessel occlusion, significant stenosis, aneurysm, or vascular malformation. 3. No significant stenosis of the major neck arteries. Electronically Signed   By: Baldemar Lenis M.D.   On: 07/10/2023 15:10    Procedures Procedures    Medications Ordered in ED Medications  meclizine (ANTIVERT) tablet 50 mg (50 mg Oral Given 07/10/23 1444)  iohexol (OMNIPAQUE) 350 MG/ML injection 100 mL (75 mLs Intravenous Contrast Given 07/10/23 1419)  diazepam (VALIUM) tablet 5 mg (5 mg Oral Given 07/10/23 1704)    ED Course/  Medical Decision Making/ A&P                                 Medical Decision Making Amount and/or Complexity of Data Reviewed Labs: ordered. Radiology: ordered.  Risk Prescription drug management.   This patient presents to the ED with chief complaint(s) of dizziness, headache with pertinent past medical history of diabetes, hypertension.  The complaint involves an extensive differential diagnosis and also carries with it a high risk of complications and morbidity.    The differential diagnosis includes posterior stroke, central vertigo, peripheral vertigo, intracranial lesion   The initial plan is to obtain baseline labs, CTA head and neck  Additional history obtained: Records reviewed Primary Care Documents - patient was seen for   Initial Assessment:   HINTS test suggestive of peripheral vertigo.  Corrective saccade when head turned to the right.  Left beating horizontal nystagmus present, worse with EOM.  No skew deviation.  Ptosis of the right eyelid, which patient reports is chronic.  PERRL.  Pupils 6 mm bilaterally.  Vision grossly intact.  No unilateral weakness.  Sensation grossly intact.  5/5 strength in all extremities.  Very unsteady gait when walking, only able to walk short distance.  Abdomen soft with periumbilical tenderness, no distension, appreciable hernias, or overlying skin changes.    Independent ECG/labs interpretation:  The following labs were independently interpreted:  CBC without leukocytosis or anemia.  Metabolic panel without major electrolyte disturbance.  Renal function is normal.  UA without evidence of infection.  Independent visualization and interpretation of imaging: I independently visualized the following imaging with scope of interpretation limited to determining acute life threatening conditions related to emergency care: CT angio head and neck, which revealed no evidence of acute intracranial abnormality, LVO, stenosis, aneurysm, vascular  malformation.  CT abdomen pelvis was also ordered which demonstrates no acute finding to explain patient's abdominal pain.  Treatment and Reassessment: Patient given 50 mg meclizine with mild improvement in symptoms.  When attempting to ambulate again, patient had a very unsteady gait and was only able to walk a short distance before her legs began to give out.  Patient given Valium to help with vertigo symptoms.  Scopolamine patch considered, but not available.   Disposition:  Based on patient's presentation and severity of symptoms, I feel that she would benefit from an MRI to rule out a posterior stroke.  Workup thus far has been overall negative and without obvious explanation of her symptoms.  Despite medication, she continues to have a very unsteady gait and severe dizziness.  She also continues to complain of a occipital headache.  Patient will be transferred POV to [facility] ED for MRI.  Accepting physician Dr. Renaye Rakers.  Based on patient response to medication and MRI results, neuro consultation may be required.            Final Clinical Impression(s) / ED Diagnoses Final diagnoses:  Vertigo    Rx / DC Orders ED Discharge Orders     None         Lenard Simmer, PA-C 07/10/23 1717    Coral Spikes, DO 07/11/23 [phone]

## 2023-07-10 NOTE — ED Notes (Signed)
Pt ambulatory to bathroom, one person assistance, staggered and unsteady gait noted.

## 2023-07-10 NOTE — ED Notes (Signed)
Pt in imaging

## 2023-07-10 NOTE — ED Provider Notes (Signed)
Patient here to have MRI scan to rule out cerebellar hemorrhage.  Scan was performed and per radiology no evidence of acute stroke.  Patient is better after being medicated at prior facility.  Will place on Valium and meclizine and she will follow-up with her doctor   Lorre Nick, MD 07/10/23 2340

## 2023-07-10 NOTE — ED Notes (Signed)
Attempted report to receiving ED RN, no answer.

## 2023-07-11 DIAGNOSIS — R42 Dizziness and giddiness: Secondary | ICD-10-CM | POA: Diagnosis not present

## 2023-07-20 DIAGNOSIS — H8112 Benign paroxysmal vertigo, left ear: Secondary | ICD-10-CM | POA: Diagnosis not present

## 2023-08-03 DIAGNOSIS — E1165 Type 2 diabetes mellitus with hyperglycemia: Secondary | ICD-10-CM | POA: Diagnosis not present

## 2023-08-03 DIAGNOSIS — E7800 Pure hypercholesterolemia, unspecified: Secondary | ICD-10-CM | POA: Diagnosis not present

## 2023-08-10 DIAGNOSIS — I10 Essential (primary) hypertension: Secondary | ICD-10-CM | POA: Diagnosis not present

## 2023-08-10 DIAGNOSIS — E1165 Type 2 diabetes mellitus with hyperglycemia: Secondary | ICD-10-CM | POA: Diagnosis not present

## 2023-08-10 DIAGNOSIS — E7800 Pure hypercholesterolemia, unspecified: Secondary | ICD-10-CM | POA: Diagnosis not present

## 2023-08-10 DIAGNOSIS — Z9641 Presence of insulin pump (external) (internal): Secondary | ICD-10-CM | POA: Diagnosis not present

## 2023-08-17 DIAGNOSIS — Z01812 Encounter for preprocedural laboratory examination: Secondary | ICD-10-CM | POA: Diagnosis not present

## 2023-08-27 DIAGNOSIS — J020 Streptococcal pharyngitis: Secondary | ICD-10-CM | POA: Diagnosis not present

## 2023-09-28 DIAGNOSIS — L987 Excessive and redundant skin and subcutaneous tissue: Secondary | ICD-10-CM | POA: Diagnosis not present

## 2023-09-28 DIAGNOSIS — E65 Localized adiposity: Secondary | ICD-10-CM | POA: Diagnosis not present

## 2023-09-28 DIAGNOSIS — Z411 Encounter for cosmetic surgery: Secondary | ICD-10-CM | POA: Diagnosis not present

## 2023-09-28 DIAGNOSIS — M793 Panniculitis, unspecified: Secondary | ICD-10-CM | POA: Diagnosis not present

## 2023-11-08 DIAGNOSIS — Z23 Encounter for immunization: Secondary | ICD-10-CM | POA: Diagnosis not present

## 2023-11-08 DIAGNOSIS — G4733 Obstructive sleep apnea (adult) (pediatric): Secondary | ICD-10-CM | POA: Diagnosis not present

## 2023-11-08 DIAGNOSIS — E1165 Type 2 diabetes mellitus with hyperglycemia: Secondary | ICD-10-CM | POA: Diagnosis not present

## 2023-11-08 DIAGNOSIS — Z794 Long term (current) use of insulin: Secondary | ICD-10-CM | POA: Diagnosis not present

## 2023-11-08 DIAGNOSIS — Z0000 Encounter for general adult medical examination without abnormal findings: Secondary | ICD-10-CM | POA: Diagnosis not present

## 2023-11-16 DIAGNOSIS — E1165 Type 2 diabetes mellitus with hyperglycemia: Secondary | ICD-10-CM | POA: Diagnosis not present

## 2023-11-16 DIAGNOSIS — E785 Hyperlipidemia, unspecified: Secondary | ICD-10-CM | POA: Diagnosis not present

## 2023-11-16 DIAGNOSIS — Z0000 Encounter for general adult medical examination without abnormal findings: Secondary | ICD-10-CM | POA: Diagnosis not present

## 2023-12-12 DIAGNOSIS — H524 Presbyopia: Secondary | ICD-10-CM | POA: Diagnosis not present

## 2023-12-12 DIAGNOSIS — H1789 Other corneal scars and opacities: Secondary | ICD-10-CM | POA: Diagnosis not present

## 2023-12-12 DIAGNOSIS — H40053 Ocular hypertension, bilateral: Secondary | ICD-10-CM | POA: Diagnosis not present

## 2023-12-12 DIAGNOSIS — H52213 Irregular astigmatism, bilateral: Secondary | ICD-10-CM | POA: Diagnosis not present

## 2023-12-12 DIAGNOSIS — H5203 Hypermetropia, bilateral: Secondary | ICD-10-CM | POA: Diagnosis not present

## 2023-12-12 DIAGNOSIS — E113293 Type 2 diabetes mellitus with mild nonproliferative diabetic retinopathy without macular edema, bilateral: Secondary | ICD-10-CM | POA: Diagnosis not present

## 2023-12-12 DIAGNOSIS — H2513 Age-related nuclear cataract, bilateral: Secondary | ICD-10-CM | POA: Diagnosis not present

## 2023-12-27 DIAGNOSIS — R52 Pain, unspecified: Secondary | ICD-10-CM | POA: Diagnosis not present

## 2023-12-27 DIAGNOSIS — R051 Acute cough: Secondary | ICD-10-CM | POA: Diagnosis not present

## 2023-12-27 DIAGNOSIS — J029 Acute pharyngitis, unspecified: Secondary | ICD-10-CM | POA: Diagnosis not present

## 2023-12-27 DIAGNOSIS — J101 Influenza due to other identified influenza virus with other respiratory manifestations: Secondary | ICD-10-CM | POA: Diagnosis not present

## 2024-01-10 ENCOUNTER — Other Ambulatory Visit (HOSPITAL_COMMUNITY): Payer: Self-pay

## 2024-01-11 ENCOUNTER — Other Ambulatory Visit (HOSPITAL_COMMUNITY): Payer: Self-pay | Admitting: Internal Medicine

## 2024-01-11 ENCOUNTER — Telehealth (HOSPITAL_COMMUNITY): Payer: Self-pay

## 2024-01-11 ENCOUNTER — Other Ambulatory Visit (HOSPITAL_COMMUNITY): Payer: Self-pay

## 2024-01-11 NOTE — Telephone Encounter (Signed)
Advanced Heart Failure Patient Advocate Encounter  Prior authorization for Corlanor has been submitted and approved. Test billing returns refill too soon rejection. Unable to verify copay at this time.  Key: [id number] Effective: 01/10/2024 to 01/08/2025  Burnell Blanks, CPhT Rx Patient Advocate Phone: [phone]

## 2024-01-18 ENCOUNTER — Other Ambulatory Visit (HOSPITAL_COMMUNITY): Payer: Self-pay | Admitting: Internal Medicine

## 2024-01-26 ENCOUNTER — Observation Stay (HOSPITAL_BASED_OUTPATIENT_CLINIC_OR_DEPARTMENT_OTHER)
Admission: EM | Admit: 2024-01-26 | Discharge: 2024-01-28 | Disposition: A | Discharge status: Home / Self Care | Payer: BC Managed Care – PPO | Attending: Internal Medicine | Admitting: Internal Medicine

## 2024-01-26 ENCOUNTER — Emergency Department (HOSPITAL_BASED_OUTPATIENT_CLINIC_OR_DEPARTMENT_OTHER): Payer: BC Managed Care – PPO

## 2024-01-26 ENCOUNTER — Encounter (HOSPITAL_BASED_OUTPATIENT_CLINIC_OR_DEPARTMENT_OTHER): Payer: Self-pay | Admitting: Emergency Medicine

## 2024-01-26 DIAGNOSIS — Z8543 Personal history of malignant neoplasm of ovary: Secondary | ICD-10-CM | POA: Insufficient documentation

## 2024-01-26 DIAGNOSIS — Z79899 Other long term (current) drug therapy: Secondary | ICD-10-CM | POA: Insufficient documentation

## 2024-01-26 DIAGNOSIS — L02211 Cutaneous abscess of abdominal wall: Secondary | ICD-10-CM | POA: Diagnosis not present

## 2024-01-26 DIAGNOSIS — Z7984 Long term (current) use of oral hypoglycemic drugs: Secondary | ICD-10-CM | POA: Insufficient documentation

## 2024-01-26 DIAGNOSIS — L089 Local infection of the skin and subcutaneous tissue, unspecified: Secondary | ICD-10-CM

## 2024-01-26 DIAGNOSIS — I10 Essential (primary) hypertension: Secondary | ICD-10-CM | POA: Diagnosis present

## 2024-01-26 DIAGNOSIS — K429 Umbilical hernia without obstruction or gangrene: Secondary | ICD-10-CM | POA: Diagnosis not present

## 2024-01-26 DIAGNOSIS — E119 Type 2 diabetes mellitus without complications: Secondary | ICD-10-CM | POA: Diagnosis not present

## 2024-01-26 DIAGNOSIS — W19XXXA Unspecified fall, initial encounter: Secondary | ICD-10-CM | POA: Insufficient documentation

## 2024-01-26 DIAGNOSIS — Z794 Long term (current) use of insulin: Secondary | ICD-10-CM | POA: Diagnosis not present

## 2024-01-26 DIAGNOSIS — D509 Iron deficiency anemia, unspecified: Secondary | ICD-10-CM | POA: Diagnosis not present

## 2024-01-26 DIAGNOSIS — Z90710 Acquired absence of both cervix and uterus: Secondary | ICD-10-CM | POA: Diagnosis not present

## 2024-01-26 DIAGNOSIS — I110 Hypertensive heart disease with heart failure: Secondary | ICD-10-CM | POA: Diagnosis not present

## 2024-01-26 DIAGNOSIS — S31109A Unspecified open wound of abdominal wall, unspecified quadrant without penetration into peritoneal cavity, initial encounter: Principal | ICD-10-CM | POA: Diagnosis present

## 2024-01-26 DIAGNOSIS — D75839 Thrombocytosis, unspecified: Secondary | ICD-10-CM | POA: Insufficient documentation

## 2024-01-26 DIAGNOSIS — I5022 Chronic systolic (congestive) heart failure: Secondary | ICD-10-CM | POA: Diagnosis present

## 2024-01-26 DIAGNOSIS — I5042 Chronic combined systolic (congestive) and diastolic (congestive) heart failure: Secondary | ICD-10-CM | POA: Diagnosis not present

## 2024-01-26 DIAGNOSIS — Z91040 Latex allergy status: Secondary | ICD-10-CM | POA: Diagnosis not present

## 2024-01-26 DIAGNOSIS — R1031 Right lower quadrant pain: Secondary | ICD-10-CM | POA: Diagnosis not present

## 2024-01-26 LAB — COMPREHENSIVE METABOLIC PANEL
ALT: 13 U/L (ref 0–44)
AST: 16 U/L (ref 15–41)
Albumin: 4.9 g/dL (ref 3.5–5.0)
Alkaline Phosphatase: 71 U/L (ref 38–126)
Anion gap: 14 (ref 5–15)
BUN: 24 mg/dL — ABNORMAL HIGH (ref 6–20)
CO2: 24 mmol/L (ref 22–32)
Calcium: 10.2 mg/dL (ref 8.9–10.3)
Chloride: 100 mmol/L (ref 98–111)
Creatinine, Ser: 0.8 mg/dL (ref 0.44–1.00)
GFR, Estimated: >60 mL/min (ref >60)
Glucose, Bld: 121 mg/dL — ABNORMAL HIGH (ref 70–99)
Potassium: 4.3 mmol/L (ref 3.5–5.1)
Sodium: 138 mmol/L (ref 135–145)
Total Bilirubin: 0.5 mg/dL (ref 0.0–1.2)
Total Protein: 7.9 g/dL (ref 6.5–8.1)

## 2024-01-26 LAB — URINALYSIS, ROUTINE W REFLEX MICROSCOPIC
Bacteria, UA: NONE SEEN
Bilirubin Urine: NEGATIVE
Glucose, UA: >1000 mg/dL — AB
Hgb urine dipstick: NEGATIVE
Ketones, ur: 40 mg/dL — AB
Leukocytes,Ua: NEGATIVE
Nitrite: NEGATIVE
Specific Gravity, Urine: >1.046 — ABNORMAL HIGH (ref 1.005–1.030)
pH: 7 (ref 5.0–8.0)

## 2024-01-26 LAB — CBC
HCT: 36.1 % (ref 36.0–46.0)
Hemoglobin: 10.9 g/dL — ABNORMAL LOW (ref 12.0–15.0)
MCH: 21.7 pg — ABNORMAL LOW (ref 26.0–34.0)
MCHC: 30.2 g/dL (ref 30.0–36.0)
MCV: 71.9 fL — ABNORMAL LOW (ref 80.0–100.0)
Platelets: 413 10*3/uL — ABNORMAL HIGH (ref 150–400)
RBC: 5.02 MIL/uL (ref 3.87–5.11)
RDW: 20.2 % — ABNORMAL HIGH (ref 11.5–15.5)
WBC: 8.8 10*3/uL (ref 4.0–10.5)
nRBC: 0 % (ref 0.0–0.2)

## 2024-01-26 LAB — LIPASE, BLOOD: Lipase: 64 U/L — ABNORMAL HIGH (ref 11–51)

## 2024-01-26 MED ORDER — ONDANSETRON HCL 4 MG/2ML IJ SOLN
4.0000 mg | Freq: Once | INTRAMUSCULAR | Status: AC
  Administered 2024-01-26: 4 mg via INTRAVENOUS
  Filled 2024-01-26: qty 2

## 2024-01-26 MED ORDER — VANCOMYCIN HCL IN DEXTROSE 1-5 GM/200ML-% IV SOLN
1000.0000 mg | Freq: Once | INTRAVENOUS | Status: AC
  Administered 2024-01-26: 1000 mg via INTRAVENOUS
  Filled 2024-01-26: qty 200

## 2024-01-26 MED ORDER — MORPHINE SULFATE (PF) 4 MG/ML IV SOLN
4.0000 mg | Freq: Once | INTRAVENOUS | Status: AC
  Administered 2024-01-26: 4 mg via INTRAVENOUS
  Filled 2024-01-26: qty 1

## 2024-01-26 MED ORDER — VANCOMYCIN HCL 500 MG IV SOLR
500.0000 mg | Freq: Once | INTRAVENOUS | Status: AC
  Administered 2024-01-26: 500 mg via INTRAVENOUS

## 2024-01-26 MED ORDER — SODIUM CHLORIDE 0.9 % IV BOLUS
1000.0000 mL | Freq: Once | INTRAVENOUS | Status: AC
  Administered 2024-01-26: 1000 mL via INTRAVENOUS

## 2024-01-26 MED ORDER — IOHEXOL 300 MG/ML  SOLN
100.0000 mL | Freq: Once | INTRAMUSCULAR | Status: AC | PRN
  Administered 2024-01-26: 100 mL via INTRAVENOUS

## 2024-01-26 NOTE — ED Provider Notes (Signed)
 [place] [facility] DEPARTMENT AT [facility] Provider Note   CSN: [id number] Arrival date & time: 01/26/24  1658     History  Chief Complaint  Patient presents with   Abdominal Pain    Renee Howell is a 58 y.o. female.  Patient with history of hypertension, diabetes, history of chronic systolic heart failure with recovered EF --presents to the emergency department tonight for evaluation of abdominal pain.  Patient had a panniculectomy after weight loss, September 28, 2023.  Patient developed a wound that has been chronic in early January 2025.  Patient has had persistent drainage through this area.  It is being monitored.  She is not on any antibiotics for this.  Over the past 1 week she has had worsening pain across her abdomen and lower abdomen, focusing on the umbilicus at times.  She has had subjective fevers last night.  She has persistent drainage.  Pain was worse today prompting emergency department visit.  She has had some nausea and vomiting today.      Home Medications Prior to Admission medications   Medication Sig Start Date End Date Taking? Authorizing Provider  acetaminophen (TYLENOL) 325 MG tablet Take 650 mg by mouth every 6 (six) hours as needed for mild pain.    [provider]  atorvastatin (LIPITOR) 10 MG tablet TAKE 1 TABLET BY MOUTH EVERY DAY 01/18/24   Bensimhon, Bevelyn Buckles, MD  calcium citrate-vitamin D (CITRACAL+D) 315-200 MG-UNIT tablet Take 1 tablet by mouth 2 (two) times daily.    [provider]  carvedilol (COREG) 6.25 MG tablet TAKE 1 TABLET BY MOUTH TWICE A DAY WITH FOOD 01/11/24   Bensimhon, Bevelyn Buckles, MD  cholecalciferol (VITAMIN D) 1000 units tablet Take 2,000 Units by mouth daily.    [provider]  CORLANOR 5 MG TABS tablet TAKE 1 TABLET BY MOUTH TWICE A DAY WITH FOOD 04/11/23   Bensimhon, Bevelyn Buckles, MD  diazepam (VALIUM) 2 MG tablet Take 1 tablet (2 mg total) by mouth every 6 (six) hours as needed (Dizziness).  07/10/23   Lorre Nick, MD  DULoxetine (CYMBALTA) 30 MG capsule Take 90 mg by mouth daily.    [provider]  fluticasone (FLONASE) 50 MCG/ACT nasal spray Place 2 sprays into both nostrils daily.    [provider]  furosemide (LASIX) 20 MG tablet TAKE 1 TABLET DAILY AS NEEDED, WEIGHT GAIN GREATER THAN 3 POUNDS IN 1 DAY OR GREATER THAN 5 POUNDS IN 1 WEEK 09/27/21   Bensimhon, Bevelyn Buckles, MD  Insulin Human (INSULIN PUMP) SOLN Inject 55 each into the skin 3 times daily with meals, bedtime and 2 AM. Medication: Novolog, Sliding scale    [provider]  JARDIANCE 10 MG TABS tablet TAKE 1 TABLET BY MOUTH EVERY DAY 01/31/23   Bensimhon, Bevelyn Buckles, MD  losartan (COZAAR) 25 MG tablet Take 0.5 tablets (12.5 mg total) by mouth daily. 05/29/23   Alen Bleacher, NP  meclizine (ANTIVERT) 25 MG tablet Take 1 tablet (25 mg total) by mouth 3 (three) times daily as needed for dizziness. 07/10/23   Lorre Nick, MD  Melatonin 1 MG TABS Take 5 mg by mouth at bedtime as needed.     [provider]  metFORMIN (GLUCOPHAGE) 1000 MG tablet Take 1,000 mg by mouth 2 (two) times daily.    [provider]  montelukast (SINGULAIR) 10 MG tablet Take 10 mg by mouth daily. 04/26/17   [provider]  OZEMPIC, 0.25 OR 0.5 MG/DOSE,  2 MG/1.5ML SOPN Inject 2 mg into the skin once a week. 02/01/22   [provider]  pantoprazole (PROTONIX) 20 MG tablet Take 20 mg by mouth daily. 06/18/19   [provider]  ranitidine (ZANTAC) 150 MG tablet Take 150 mg by mouth daily as needed for heartburn.    [provider]  spironolactone (ALDACTONE) 25 MG tablet TAKE 1 TABLET BY MOUTH EVERY DAY 01/18/24   Bensimhon, Bevelyn Buckles, MD      Allergies    Trulicity [dulaglutide] and Latex    Review of Systems   Review of Systems  Physical Exam Updated Vital Signs BP (!) 133/58 (BP Location: Right Arm)   Pulse 87   Temp 97.8 F (36.6 C) (Oral)   Resp 16   Ht 5\' 5"  (1.651 m)    Wt 62.6 kg   SpO2 100%   BMI 22.96 kg/m  Physical Exam Vitals and nursing note reviewed.  Constitutional:      General: She is not in acute distress.    Appearance: She is well-developed.  HENT:     Head: Normocephalic and atraumatic.     Right Ear: External ear normal.     Left Ear: External ear normal.     Nose: Nose normal.  Eyes:     Conjunctiva/sclera: Conjunctivae normal.  Cardiovascular:     Rate and Rhythm: Normal rate and regular rhythm.     Heart sounds: No murmur heard. Pulmonary:     Effort: No respiratory distress.     Breath sounds: No wheezing, rhonchi or rales.  Abdominal:     Palpations: Abdomen is soft.     Tenderness: There is generalized abdominal tenderness. There is no guarding or rebound. Negative signs include Murphy's sign and McBurney's sign.     Comments: Patient with an open wound, midline lower abdomen about the size of a dime with yellow drainage noted on the dressing.  Patient with areas of induration inferior and superior to the umbilicus that are deep.  No overlying erythema or drainage.  She does have a small fat-containing hernia present.  Musculoskeletal:     Cervical back: Normal range of motion and neck supple.     Right lower leg: No edema.     Left lower leg: No edema.  Skin:    General: Skin is warm and dry.     Findings: No rash.  Neurological:     General: No focal deficit present.     Mental Status: She is alert. Mental status is at baseline.     Motor: No weakness.  Psychiatric:        Mood and Affect: Mood normal.     ED Results / Procedures / Treatments   Labs (all labs ordered are listed, but only abnormal results are displayed) Labs Reviewed  LIPASE, BLOOD - Abnormal; Notable for the following components:      Result Value   Lipase 64 (*)    All other components within normal limits  COMPREHENSIVE METABOLIC PANEL - Abnormal; Notable for the following components:   Glucose, Bld 121 (*)    BUN 24 (*)    All other  components within normal limits  CBC - Abnormal; Notable for the following components:   Hemoglobin 10.9 (*)    MCV 71.9 (*)    MCH 21.7 (*)    RDW 20.2 (*)    Platelets 413 (*)    All other components within normal limits  URINALYSIS, ROUTINE W REFLEX MICROSCOPIC -  Abnormal; Notable for the following components:   Specific Gravity, Urine >1.046 (*)    Glucose, UA >1,000 (*)    Ketones, ur 40 (*)    Protein, ur TRACE (*)    All other components within normal limits    EKG None  Radiology CT ABDOMEN PELVIS W CONTRAST Result Date: 01/26/2024 CLINICAL DATA:  RLQ abdominal pain EXAM: CT ABDOMEN AND PELVIS WITH CONTRAST TECHNIQUE: Multidetector CT imaging of the abdomen and pelvis was performed using the standard protocol following bolus administration of intravenous contrast. RADIATION DOSE REDUCTION: This exam was performed according to the departmental dose-optimization program which includes automated exposure control, adjustment of the mA and/or kV according to patient size and/or use of iterative reconstruction technique. CONTRAST:  OMNIPAQUE IOHEXOL 300 MG/ML  SOLN COMPARISON:  None Available. FINDINGS: Lower chest: No acute abnormality. Hepatobiliary: No focal liver abnormality. No gallstones, gallbladder wall thickening, or pericholecystic fluid. No biliary dilatation. Pancreas: No focal lesion. Normal pancreatic contour. No surrounding inflammatory changes. No main pancreatic ductal dilatation. Spleen: Normal in size without focal abnormality. Adrenals/Urinary Tract: No adrenal nodule bilaterally. Bilateral kidneys enhance symmetrically. No hydronephrosis. No hydroureter. The urinary bladder is unremarkable. On delayed imaging, there is no urothelial wall thickening and there are no filling defects in the opacified portions of the bilateral collecting systems or ureters. Stomach/Bowel: Stomach is within normal limits. No evidence of bowel wall thickening or dilatation. Stool  throughout the colon. Appendix appears normal. Vascular/Lymphatic: No abdominal aorta or iliac aneurysm. No abdominal, pelvic, or inguinal lymphadenopathy. Reproductive: Status post hysterectomy. No adnexal masses. Other: No intraperitoneal free fluid. No intraperitoneal free gas. No organized fluid collection. Musculoskeletal: Bilateral breast implants partially visualized. Small fat containing umbilical hernia. Interval development of the deep subcutaneus soft tissue fluid collections both superior and inferior to the umbilicus measuring 1.6 x 1.2 x 2.8 cm supraumbilical region and 1.6 x 2.2 x 2.1 cm along the infraumbilical region. Associated peripheral enhancement. Foci of gas inferiorly along the superficial soft tissues (2:63). No suspicious lytic or blastic osseous lesions. No acute displaced fracture. IMPRESSION: 1. Stool throughout the colon-correlate for constipation. 2. Small fat containing umbilical hernia. Interval development of the deep subcutaneus soft tissue abscess in the supraumbilical region measuring 1.6 x 1.2 x 2.8 and in the infraumbilical region 1.6 x 2.2 x 2.1 cm. Foci of gas inferiorly along the superficial soft tissues. Recommend correlation with recent surgical procedure. Electronically Signed   By: Tish Frederickson M.D.   On: 01/26/2024 19:06    Procedures Procedures    Medications Ordered in ED Medications  sodium chloride 0.9 % bolus 1,000 mL (has no administration in time range)  vancomycin (VANCOCIN) IVPB 1000 mg/200 mL premix (has no administration in time range)    Followed by  vancomycin (VANCOCIN) 500 mg in sodium chloride 0.9 % 100 mL IVPB (has no administration in time range)  iohexol (OMNIPAQUE) 300 MG/ML solution 100 mL (100 mLs Intravenous Contrast Given 01/26/24 1825)    ED Course/ Medical Decision Making/ A&P    Patient seen and examined. History obtained directly from patient. Work-up including labs, imaging, EKG ordered in triage, if performed, were  reviewed.    Labs/EKG: Independently reviewed and interpreted.  This included: CBC with mild anemia hemoglobin 10.9, normal white blood cell count at 8.8; CBC with normal kidney function, glucose 121 with anion gap of 14 and a normal bicarbonate; mildly elevated lipase at 64; UA with glucose, concentrated, 40 ketones, no compelling signs of infection.  Imaging: Independently visualized and interpreted.  This included: CT of the abdomen pelvis, periumbilical abscesses noted, some soft tissue gas likely related to the patient's open wound.  Medications/Fluids: Ordered: IV vancomycin, IV morphine/Zofran  Most recent vital signs reviewed and are as follows: BP (!) 133/58 (BP Location: Right Arm)   Pulse 87   Temp 97.8 F (36.6 C) (Oral)   Resp 16   Ht 5\' 5"  (1.651 m)   Wt 62.6 kg   SpO2 100%   BMI 22.96 kg/m   Initial impression: 2 small infraumbilical and supraumbilical abscesses, suspect open wound portal of entry.  Patient does not appear to be septic.  I discussed case with general surgery, Dr. Sheliah Hatch.  Agrees with admission to the hospital, surgery to consult.  Will discuss with hospitalist.  11:02 PM discussed case with Dr. Lazarus Salines hospitalist, agrees to admission.                                Medical Decision Making Amount and/or Complexity of Data Reviewed Labs: ordered. Radiology: ordered.  Risk Prescription drug management.   For this patient's complaint of abdominal pain, the following conditions were considered on the differential diagnosis: gastritis/PUD, enteritis/duodenitis, appendicitis, cholelithiasis/cholecystitis, cholangitis, pancreatitis, ruptured viscus, colitis, diverticulitis, small/large bowel obstruction, proctitis, cystitis, pyelonephritis, ureteral colic, aortic dissection, aortic aneurysm. In women, ectopic pregnancy, pelvic inflammatory disease, ovarian cysts, and tubo-ovarian abscess were also considered. Atypical chest etiologies were also  considered including ACS, PE, and pneumonia.  Patient has reassuring lab work, however is diabetic and has new abscesses in the abdomen and lower abdominal wound.  Patient started on antibiotics for skin coverage.  Will need surgical consultation.  Admitted to hospitalist.        Final Clinical Impression(s) / ED Diagnoses Final diagnoses:  Abdominal wall abscess  Chronic wound infection of abdomen, initial encounter    Rx / DC Orders ED Discharge Orders     None         Desmond Dike 01/26/24 2303    Rondel Baton, MD 01/29/24 1136

## 2024-01-26 NOTE — ED Triage Notes (Signed)
 C/o R lower abd pain starting today. Rebound tenderness. +n/v

## 2024-01-26 NOTE — Plan of Care (Signed)
 Plan of Care Note for accepted transfer  Patient: Renee Howell    ZOX:[id number]  DOA: 01/26/2024     Facility requesting transfer: [facility]  Requesting Provider: Rondel Baton, MD  Reason for transfer: SQ abscess post panniculectomy  Facility course:   67 F with hx of hypertension, diabetes, history of chronic systolic heart failure with recovered EF, presneted with wound / drainage and abd pain. Had a panniculectomy Oct 31 '24, And has a chronic wound in the area since last month. CT demonstrating development of periumbilical SQ abscess, EDP spoke with [facility] and Gen surg will see in the morning. Currently on Vancomycin.    Plan of care: The patient is accepted for admission to Med-surg  unit, at [facility].   Author: Dolly Rias, MD  01/26/2024  Check [email or web address] for on-call coverage.  Nursing staff, Please call [facility] Admits & Consults System-Wide number on Amion as soon as patient's arrival, so appropriate admitting provider can evaluate the pt.

## 2024-01-26 NOTE — Progress Notes (Signed)
 ED Pharmacy Antibiotic Sign Off An antibiotic consult was received from an ED provider for vancomycin per pharmacy dosing for cellulitis. A chart review was completed to assess appropriateness.   The following one time order(s) were placed:  Vancomycin 1500 mg IV x 1  Further antibiotic and/or antibiotic pharmacy consults should be ordered by the admitting provider if indicated.   Thank you for allowing pharmacy to be a part of this patient's care.   Daylene Posey, [facility]  Clinical Pharmacist 01/26/24 9:20 PM

## 2024-01-27 ENCOUNTER — Other Ambulatory Visit: Payer: Self-pay

## 2024-01-27 DIAGNOSIS — Z8543 Personal history of malignant neoplasm of ovary: Secondary | ICD-10-CM | POA: Diagnosis not present

## 2024-01-27 DIAGNOSIS — S31109A Unspecified open wound of abdominal wall, unspecified quadrant without penetration into peritoneal cavity, initial encounter: Secondary | ICD-10-CM

## 2024-01-27 DIAGNOSIS — R1084 Generalized abdominal pain: Secondary | ICD-10-CM | POA: Diagnosis not present

## 2024-01-27 DIAGNOSIS — D509 Iron deficiency anemia, unspecified: Secondary | ICD-10-CM | POA: Diagnosis not present

## 2024-01-27 DIAGNOSIS — E119 Type 2 diabetes mellitus without complications: Secondary | ICD-10-CM

## 2024-01-27 DIAGNOSIS — Z79899 Other long term (current) drug therapy: Secondary | ICD-10-CM | POA: Diagnosis not present

## 2024-01-27 DIAGNOSIS — Z91040 Latex allergy status: Secondary | ICD-10-CM | POA: Diagnosis not present

## 2024-01-27 DIAGNOSIS — Z794 Long term (current) use of insulin: Secondary | ICD-10-CM | POA: Diagnosis not present

## 2024-01-27 DIAGNOSIS — I5042 Chronic combined systolic (congestive) and diastolic (congestive) heart failure: Secondary | ICD-10-CM | POA: Diagnosis not present

## 2024-01-27 DIAGNOSIS — I110 Hypertensive heart disease with heart failure: Secondary | ICD-10-CM | POA: Diagnosis not present

## 2024-01-27 DIAGNOSIS — R1031 Right lower quadrant pain: Secondary | ICD-10-CM | POA: Diagnosis not present

## 2024-01-27 DIAGNOSIS — W19XXXA Unspecified fall, initial encounter: Secondary | ICD-10-CM | POA: Diagnosis not present

## 2024-01-27 DIAGNOSIS — Z7984 Long term (current) use of oral hypoglycemic drugs: Secondary | ICD-10-CM | POA: Diagnosis not present

## 2024-01-27 DIAGNOSIS — D75839 Thrombocytosis, unspecified: Secondary | ICD-10-CM | POA: Diagnosis not present

## 2024-01-27 DIAGNOSIS — I5022 Chronic systolic (congestive) heart failure: Secondary | ICD-10-CM

## 2024-01-27 DIAGNOSIS — T8131XA Disruption of external operation (surgical) wound, not elsewhere classified, initial encounter: Secondary | ICD-10-CM | POA: Diagnosis not present

## 2024-01-27 LAB — IRON AND TIBC
Iron: 34 ug/dL (ref 28–170)
Saturation Ratios: 7 % — ABNORMAL LOW (ref 10.4–31.8)
TIBC: 514 ug/dL — ABNORMAL HIGH (ref 250–450)
UIBC: 480 ug/dL

## 2024-01-27 LAB — RETICULOCYTES
Immature Retic Fract: 27.8 % — ABNORMAL HIGH (ref 2.3–15.9)
RBC.: 4.44 MIL/uL (ref 3.87–5.11)
Retic Count, Absolute: 46.6 10*3/uL (ref 19.0–186.0)
Retic Ct Pct: 1.1 % (ref 0.4–3.1)

## 2024-01-27 LAB — CBC
HCT: 31.7 % — ABNORMAL LOW (ref 36.0–46.0)
Hemoglobin: 9.8 g/dL — ABNORMAL LOW (ref 12.0–15.0)
MCH: 22.3 pg — ABNORMAL LOW (ref 26.0–34.0)
MCHC: 30.9 g/dL (ref 30.0–36.0)
MCV: 72 fL — ABNORMAL LOW (ref 80.0–100.0)
Platelets: 343 10*3/uL (ref 150–400)
RBC: 4.4 MIL/uL (ref 3.87–5.11)
RDW: 19.9 % — ABNORMAL HIGH (ref 11.5–15.5)
WBC: 8.2 10*3/uL (ref 4.0–10.5)
nRBC: 0 % (ref 0.0–0.2)

## 2024-01-27 LAB — HEMOGLOBIN A1C
Hgb A1c MFr Bld: 6.6 % — ABNORMAL HIGH (ref 4.8–5.6)
Mean Plasma Glucose: 142.72 mg/dL

## 2024-01-27 LAB — VITAMIN B12: Vitamin B-12: 2672 pg/mL — ABNORMAL HIGH (ref 180–914)

## 2024-01-27 LAB — HIV ANTIBODY (ROUTINE TESTING W REFLEX): HIV Screen 4th Generation wRfx: NONREACTIVE

## 2024-01-27 LAB — CREATININE, SERUM
Creatinine, Ser: 0.78 mg/dL (ref 0.44–1.00)
GFR, Estimated: >60 mL/min (ref >60)

## 2024-01-27 LAB — GLUCOSE, CAPILLARY
Glucose-Capillary: 93 mg/dL (ref 70–99)
Glucose-Capillary: 87 mg/dL (ref 70–99)
Glucose-Capillary: 184 mg/dL — ABNORMAL HIGH (ref 70–99)
Glucose-Capillary: 101 mg/dL — ABNORMAL HIGH (ref 70–99)
Glucose-Capillary: 117 mg/dL — ABNORMAL HIGH (ref 70–99)

## 2024-01-27 LAB — FOLATE: Folate: 33.9 ng/mL (ref >5.9)

## 2024-01-27 LAB — FERRITIN: Ferritin: 5 ng/mL — ABNORMAL LOW (ref 11–307)

## 2024-01-27 MED ORDER — SODIUM CHLORIDE 0.9 % IV SOLN: INTRAVENOUS | Status: AC

## 2024-01-27 MED ORDER — SPIRONOLACTONE 25 MG PO TABS
25.0000 mg | ORAL_TABLET | Freq: Every day | ORAL | Status: DC
  Administered 2024-01-27 – 2024-01-28 (×2): 25 mg via ORAL
  Filled 2024-01-27 (×2): qty 1

## 2024-01-27 MED ORDER — PANTOPRAZOLE SODIUM 40 MG PO TBEC
40.0000 mg | DELAYED_RELEASE_TABLET | Freq: Every day | ORAL | Status: DC
  Administered 2024-01-27 – 2024-01-28 (×2): 40 mg via ORAL
  Filled 2024-01-27 (×2): qty 1

## 2024-01-27 MED ORDER — MORPHINE SULFATE (PF) 4 MG/ML IV SOLN
4.0000 mg | Freq: Once | INTRAVENOUS | Status: AC
  Administered 2024-01-27: 4 mg via INTRAVENOUS
  Filled 2024-01-27: qty 1

## 2024-01-27 MED ORDER — VANCOMYCIN HCL 750 MG/150ML IV SOLN
750.0000 mg | Freq: Two times a day (BID) | INTRAVENOUS | Status: DC
  Filled 2024-01-27: qty 150

## 2024-01-27 MED ORDER — ONDANSETRON HCL 4 MG PO TABS: 4.0000 mg | ORAL_TABLET | Freq: Four times a day (QID) | ORAL | Status: DC | PRN

## 2024-01-27 MED ORDER — POLYETHYLENE GLYCOL 3350 17 G PO PACK
17.0000 g | PACK | Freq: Two times a day (BID) | ORAL | Status: DC
  Administered 2024-01-27 – 2024-01-28 (×3): 17 g via ORAL
  Filled 2024-01-27 (×3): qty 1

## 2024-01-27 MED ORDER — ONDANSETRON HCL 4 MG/2ML IJ SOLN
4.0000 mg | Freq: Four times a day (QID) | INTRAMUSCULAR | Status: DC | PRN
  Administered 2024-01-27: 4 mg via INTRAVENOUS
  Filled 2024-01-27: qty 2

## 2024-01-27 MED ORDER — ACETAMINOPHEN 325 MG PO TABS
650.0000 mg | ORAL_TABLET | Freq: Four times a day (QID) | ORAL | Status: DC | PRN
  Administered 2024-01-27 – 2024-01-28 (×2): 650 mg via ORAL
  Filled 2024-01-27 (×2): qty 2

## 2024-01-27 MED ORDER — INSULIN ASPART 100 UNIT/ML IJ SOLN: 0.0000 [IU] | Freq: Three times a day (TID) | INTRAMUSCULAR | Status: DC

## 2024-01-27 MED ORDER — INSULIN ASPART 100 UNIT/ML IJ SOLN: 4.0000 [IU] | Freq: Three times a day (TID) | INTRAMUSCULAR | Status: DC

## 2024-01-27 MED ORDER — IBUPROFEN 200 MG PO TABS
400.0000 mg | ORAL_TABLET | Freq: Four times a day (QID) | ORAL | Status: DC | PRN
Start: 2024-01-27 — End: 2024-01-28
  Administered 2024-01-27 (×2): 400 mg via ORAL
  Filled 2024-01-27 (×2): qty 2

## 2024-01-27 MED ORDER — MELATONIN 5 MG PO TABS
5.0000 mg | ORAL_TABLET | Freq: Every evening | ORAL | Status: DC | PRN
  Administered 2024-01-27: 5 mg via ORAL
  Filled 2024-01-27: qty 1

## 2024-01-27 MED ORDER — DULOXETINE HCL 60 MG PO CPEP
90.0000 mg | ORAL_CAPSULE | Freq: Every day | ORAL | Status: DC
  Administered 2024-01-27 – 2024-01-28 (×2): 90 mg via ORAL
  Filled 2024-01-27 (×2): qty 1

## 2024-01-27 MED ORDER — DIAZEPAM 2 MG PO TABS: 2.0000 mg | ORAL_TABLET | Freq: Four times a day (QID) | ORAL | Status: DC | PRN

## 2024-01-27 MED ORDER — SULFAMETHOXAZOLE-TRIMETHOPRIM 800-160 MG PO TABS
1.0000 | ORAL_TABLET | Freq: Two times a day (BID) | ORAL | Status: DC
  Administered 2024-01-27 – 2024-01-28 (×3): 1 via ORAL
  Filled 2024-01-27 (×4): qty 1

## 2024-01-27 MED ORDER — LOSARTAN POTASSIUM 25 MG PO TABS
12.5000 mg | ORAL_TABLET | Freq: Every day | ORAL | Status: DC
  Administered 2024-01-27 – 2024-01-28 (×2): 12.5 mg via ORAL
  Filled 2024-01-27 (×2): qty 0.5

## 2024-01-27 MED ORDER — EMPAGLIFLOZIN 10 MG PO TABS
10.0000 mg | ORAL_TABLET | Freq: Every day | ORAL | Status: DC
  Administered 2024-01-27 – 2024-01-28 (×2): 10 mg via ORAL
  Filled 2024-01-27 (×2): qty 1

## 2024-01-27 MED ORDER — ENOXAPARIN SODIUM 40 MG/0.4ML IJ SOSY
40.0000 mg | PREFILLED_SYRINGE | INTRAMUSCULAR | Status: DC
  Administered 2024-01-27 – 2024-01-28 (×2): 40 mg via SUBCUTANEOUS
  Filled 2024-01-27 (×2): qty 0.4

## 2024-01-27 MED ORDER — SODIUM CHLORIDE 0.9% FLUSH
3.0000 mL | Freq: Two times a day (BID) | INTRAVENOUS | Status: DC
  Administered 2024-01-27 – 2024-01-28 (×3): 3 mL via INTRAVENOUS

## 2024-01-27 MED ORDER — POLYETHYLENE GLYCOL 3350 17 G PO PACK: 17.0000 g | PACK | Freq: Every day | ORAL | Status: DC | PRN

## 2024-01-27 MED ORDER — SODIUM CHLORIDE 0.9 % IV SOLN: 250.0000 mL | INTRAVENOUS | Status: AC | PRN

## 2024-01-27 MED ORDER — INSULIN PUMP
Freq: Three times a day (TID) | SUBCUTANEOUS | Status: DC
  Filled 2024-01-27: qty 1

## 2024-01-27 MED ORDER — SODIUM CHLORIDE 0.9% FLUSH: 3.0000 mL | INTRAVENOUS | Status: DC | PRN

## 2024-01-27 MED ORDER — ATORVASTATIN CALCIUM 10 MG PO TABS
10.0000 mg | ORAL_TABLET | Freq: Every day | ORAL | Status: DC
  Administered 2024-01-27 – 2024-01-28 (×2): 10 mg via ORAL
  Filled 2024-01-27 (×2): qty 1

## 2024-01-27 MED ORDER — INSULIN ASPART 100 UNIT/ML IJ SOLN: 0.0000 [IU] | Freq: Every day | INTRAMUSCULAR | Status: DC

## 2024-01-27 MED ORDER — CARVEDILOL 6.25 MG PO TABS
6.2500 mg | ORAL_TABLET | Freq: Two times a day (BID) | ORAL | Status: DC
  Administered 2024-01-27 – 2024-01-28 (×2): 6.25 mg via ORAL
  Filled 2024-01-27 (×2): qty 1

## 2024-01-27 NOTE — Progress Notes (Signed)
 Pharmacy Antibiotic Note  Renee Howell is a 58 y.o. female admitted on 01/26/2024 with subcutaneous abscess s/p panniculectomy (08/2023).  Pharmacy has been consulted for vancomycin dosing.  -CT abd: subcutaneus soft tissue abscess in the supraumbilical and in the infraumbilical region. -WBC 8.8, afebrile -Vanc 1500mg  IV x1 -No culture data  Plan: -Vancomycin 1500mg  IV x1 -Vancomycin 750mg  IV every 12 hours (AUC 546, Vd 0.72, IBW) -Monitor renal function -Follow up signs of clinical improvement, LOT, de-escalation of antibiotics    Height: 5\' 5"  (165.1 cm) Weight: 62.6 kg (138 lb) IBW/kg (Calculated) : 57  Temp (24hrs), Avg:98.2 F (36.8 C), Min:97.8 F (36.6 C), Max:98.6 F (37 C)  Recent Labs  Lab 01/26/24 1716  WBC 8.8  CREATININE 0.80    Estimated Creatinine Clearance: 68.1 mL/min (by C-G formula based on SCr of 0.8 mg/dL).    Allergies  Allergen Reactions   Trulicity [Dulaglutide] Other (See Comments)    Pt states that the trulicity makes her feel horrible three days after she uses it. States she will never take it again.    Latex Rash    Antimicrobials this admission: Vancomycin 2/28 >>   Microbiology results:  Thank you for allowing pharmacy to be a part of this patient's care.  Arabella Merles, PharmD. Clinical Pharmacist 01/27/2024 6:26 AM

## 2024-01-27 NOTE — H&P (Signed)
 History and Physical  Renee Howell:[id number] DOB: [birth year] DOA: 01/26/2024  PCP: [name], DO Patient [facility]  I have personally briefly reviewed patient's old medical records in [facility]   Chief Complaint: Panic colitis with possible abscess  HPI: Renee Howell is a 58 y.o. 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 She denies any fever nausea vomiting or diarrhea.  In the ED: She was found to be afebrile with no leukocytosis was started empirically on IV vancomycin, CT of the abdomen pelvis shows stool throughout the colon umbilical hernia and 2 small regions of fluid collection  Review of Systems: All systems reviewed and apart from history of presenting illness, are negative.  Past Medical History:  Diagnosis Date   Allergy    CHF (congestive heart failure) (HCC)    Diabetes mellitus    Hypertension    Kidney infection    Ovarian cancer (HCC)    Sleep apnea    Past Surgical History:  Procedure Laterality Date   ABDOMINAL HYSTERECTOMY     CESAREAN SECTION     EYE SURGERY     RIGHT/LEFT HEART CATH AND CORONARY ANGIOGRAPHY N/A 05/26/2017   Procedure: Right/Left Heart Cath and Coronary Angiography;  Surgeon: Dolores Patty, MD;  [facility]: [facility];  Service: Cardiovascular;  Laterality: N/A;   SHOULDER OPEN ROTATOR CUFF REPAIR  1988   Social History:  reports that she has never smoked. She has never used smokeless tobacco. She reports current alcohol  use of about 3.0 standard drinks of alcohol per week. She reports that she does not use drugs.   Allergies  Allergen Reactions   Trulicity [Dulaglutide] Other (See Comments)    Pt states that the trulicity makes her feel horrible three days after she uses it. States she will never take it again.    Latex Rash    Family History  Problem Relation Age of Onset   Cancer Mother    Diabetes Father    Bipolar disorder Brother    Dementia Brother    Diabetes Brother    Breast cancer Paternal Grandmother     Prior to Admission medications   Medication Sig Start Date End Date Taking? Authorizing Provider  acetaminophen (TYLENOL) 325 MG tablet Take 650 mg by mouth every 6 (six) hours as needed for mild pain.    [provider]  atorvastatin (LIPITOR) 10 MG tablet TAKE 1 TABLET BY MOUTH EVERY DAY 01/18/24   Bensimhon, Bevelyn Buckles, MD  calcium citrate-vitamin D (CITRACAL+D) 315-200 MG-UNIT tablet Take 1 tablet by mouth 2 (two) times daily.    [provider]  carvedilol (COREG) 6.25 MG tablet TAKE 1 TABLET BY MOUTH TWICE A DAY WITH FOOD 01/11/24   Bensimhon, Bevelyn Buckles, MD  cholecalciferol (VITAMIN D) 1000 units tablet Take 2,000 Units by mouth daily.    [provider]  CORLANOR 5 MG TABS tablet TAKE 1 TABLET BY MOUTH TWICE A DAY WITH FOOD 04/11/23   Bensimhon, Bevelyn Buckles, MD  diazepam (VALIUM) 2 MG  tablet Take 1 tablet (2 mg total) by mouth every 6 (six) hours as needed (Dizziness). 07/10/23   Lorre Nick, MD  DULoxetine (CYMBALTA) 30 MG capsule Take 90 mg by mouth daily.    [provider]  fluticasone (FLONASE) 50 MCG/ACT nasal spray Place 2 sprays into both nostrils daily.    [provider]  furosemide (LASIX) 20 MG tablet TAKE 1 TABLET DAILY AS NEEDED, WEIGHT GAIN GREATER THAN 3 POUNDS IN 1 DAY OR GREATER THAN 5 POUNDS IN 1 WEEK 09/27/21   Bensimhon, Bevelyn Buckles, MD  Insulin Human (INSULIN PUMP) SOLN Inject 55 each into the skin 3 times daily with meals,  bedtime and 2 AM. Medication: Novolog, Sliding scale    [provider]  JARDIANCE 10 MG TABS tablet TAKE 1 TABLET BY MOUTH EVERY DAY 01/31/23   Bensimhon, Bevelyn Buckles, MD  losartan (COZAAR) 25 MG tablet Take 0.5 tablets (12.5 mg total) by mouth daily. 05/29/23   Alen Bleacher, NP  meclizine (ANTIVERT) 25 MG tablet Take 1 tablet (25 mg total) by mouth 3 (three) times daily as needed for dizziness. 07/10/23   Lorre Nick, MD  Melatonin 1 MG TABS Take 5 mg by mouth at bedtime as needed.     [provider]  metFORMIN (GLUCOPHAGE) 1000 MG tablet Take 1,000 mg by mouth 2 (two) times daily.    [provider]  montelukast (SINGULAIR) 10 MG tablet Take 10 mg by mouth daily. 04/26/17   [provider]  OZEMPIC, 0.25 OR 0.5 MG/DOSE, 2 MG/1.5ML SOPN Inject 2 mg into the skin once a week. 02/01/22   [provider]  pantoprazole (PROTONIX) 20 MG tablet Take 20 mg by mouth daily. 06/18/19   [provider]  ranitidine (ZANTAC) 150 MG tablet Take 150 mg by mouth daily as needed for heartburn.    [provider]  spironolactone (ALDACTONE) 25 MG tablet TAKE 1 TABLET BY MOUTH EVERY DAY 01/18/24   Bensimhon, Bevelyn Buckles, MD   Physical Exam: Vitals:   01/27/24 0136 01/27/24 0151 01/27/24 0250 01/27/24 0820  BP:  (!) 145/83 130/63 130/71  Pulse: 80  78 80  Resp:   16   Temp:   98.1 F (36.7 C) 97.8 F (36.6 C)  TempSrc:      SpO2: 98%  100% 98%  Weight:      Height:        General exam: Moderately built and nourished patient, lying comfortably supine on the gurney in no obvious distress. Head, eyes and ENT: Nontraumatic and normocephalic. Neck: Supple. No JVD, carotid bruit or thyromegaly. Lymphatics: No lymphadenopathy. Respiratory system: Clear to auscultation. No increased work of breathing. Cardiovascular system: S1 and S2 heard, RRR. No JVD. Gastrointestinal system: Abdomen is nondistended, soft and nontender. Normal bowel sounds heard. No  organomegaly or masses appreciated. Central nervous system: Alert and oriented. No focal neurological deficits. Extremities: Symmetric 5 x 5 power. Peripheral pulses symmetrically felt.  Skin: She has an abdominoplasty scar in the subcu umbilical area there is an opening with serosanguineous drainage no erythema, no warmth to touch no appreciated fluid collection and not tender Musculoskeletal system: Negative exam. Psychiatry: Pleasant and cooperative.   Labs on Admission:  Basic Metabolic Panel: Recent Labs  Lab 01/26/24 1716  NA 138  K 4.3  CL 100  CO2 24  GLUCOSE 121*  BUN 24*  CREATININE 0.80  CALCIUM 10.2   Liver Function Tests: Recent Labs  Lab 01/26/24 1716  AST 16  ALT 13  ALKPHOS 71  BILITOT 0.5  PROT 7.9  ALBUMIN 4.9   Recent Labs  Lab 01/26/24 1716  LIPASE 64*   No results for input(s): "AMMONIA" in the last 168 hours. CBC: Recent Labs  Lab 01/26/24 1716  WBC 8.8  HGB 10.9*  HCT 36.1  MCV 71.9*  PLT 413*   Cardiac Enzymes: No results for input(s): "CKTOTAL", "CKMB", "CKMBINDEX", "TROPONINI" in the last 168 hours.  BNP (last 3 results) No results for input(s): "PROBNP" in the last 8760 hours. CBG: Recent Labs  Lab 01/27/24 0246 01/27/24 0615  GLUCAP 93 87    Radiological Exams on Admission: CT ABDOMEN PELVIS W CONTRAST Result Date: 01/26/2024 CLINICAL DATA:  RLQ abdominal pain EXAM: CT ABDOMEN AND PELVIS WITH CONTRAST TECHNIQUE: Multidetector CT imaging of the abdomen and pelvis was performed using the standard protocol following bolus administration of intravenous contrast. RADIATION DOSE REDUCTION: This exam was performed according to the departmental dose-optimization program which includes automated exposure control, adjustment of the mA and/or kV according to patient size and/or use of iterative reconstruction technique. CONTRAST:  OMNIPAQUE IOHEXOL 300 MG/ML  SOLN COMPARISON:  None Available. FINDINGS: Lower chest: No acute  abnormality. Hepatobiliary: No focal liver abnormality. No gallstones, gallbladder wall thickening, or pericholecystic fluid. No biliary dilatation. Pancreas: No focal lesion. Normal pancreatic contour. No surrounding inflammatory changes. No main pancreatic ductal dilatation. Spleen: Normal in size without focal abnormality. Adrenals/Urinary Tract: No adrenal nodule bilaterally. Bilateral kidneys enhance symmetrically. No hydronephrosis. No hydroureter. The urinary bladder is unremarkable. On delayed imaging, there is no urothelial wall thickening and there are no filling defects in the opacified portions of the bilateral collecting systems or ureters. Stomach/Bowel: Stomach is within normal limits. No evidence of bowel wall thickening or dilatation. Stool throughout the colon. Appendix appears normal. Vascular/Lymphatic: No abdominal aorta or iliac aneurysm. No abdominal, pelvic, or inguinal lymphadenopathy. Reproductive: Status post hysterectomy. No adnexal masses. Other: No intraperitoneal free fluid. No intraperitoneal free gas. No organized fluid collection. Musculoskeletal: Bilateral breast implants partially visualized. Small fat containing umbilical hernia. Interval development of the deep subcutaneus soft tissue fluid collections both superior and inferior to the umbilicus measuring 1.6 x 1.2 x 2.8 cm supraumbilical region and 1.6 x 2.2 x 2.1 cm along the infraumbilical region. Associated peripheral enhancement. Foci of gas inferiorly along the superficial soft tissues (2:63). No suspicious lytic or blastic osseous lesions. No acute displaced fracture. IMPRESSION: 1. Stool throughout the colon-correlate for constipation. 2. Small fat containing umbilical hernia. Interval development of the deep subcutaneus soft tissue abscess in the supraumbilical region measuring 1.6 x 1.2 x 2.8 and in the infraumbilical region 1.6 x 2.2 x 2.1 cm. Foci of gas inferiorly along the superficial soft tissues. Recommend  correlation with recent surgical procedure. Electronically Signed   By: Tish Frederickson M.D.   On: 01/26/2024 19:06    EKG: Independently reviewed.   Assessment/Plan Abdominal wall wound She has remained afebrile with no leukocytosis or systemic symptoms, there is no erythema, warm to touch or tenderness around the abdominal wall wound, there is trace serosanguineous drainage, there is some dehiscence of the skin. I do not believe this is infected, as a matter fact and I believe these are abscesses. General surgery was consulted which they seem to agree, they recommended wound packing and empiric antibiotics. Will transition her to oral Bactrim. Her specific gravity is significantly elevated we will start her on IV fluids for 24 hours as she recently took metformin and  receive IV contrast.  Essential HTN/  Chronic systolic CHF now with recovered EF: Her EF has recovered, we will go ahead and start her home dose of ARB Coreg and Jardiance. Blood pressure slightly elevated continue to monitor closely.  Type 2 diabetes mellitus without complication, with long-term current use of insulin [facility]): She can be has her insulin pump on with an off material, and explained to her that she will continue to manage her insulin pump while in house.  Microcytic anemia: Likely due to iron deficiency check an anemia panel.  Mild thrombocytosis: Likely reactive continue to monitor.    DVT Prophylaxis: lovenox Code Status: Full Family Communication: none  Disposition Plan: inpatient   Time spent: 75 min    Marinda Elk MD [facility]   01/27/2024, 8:23 AM

## 2024-01-27 NOTE — ED Notes (Signed)
Attempted to call report, no answer on unit. 

## 2024-01-27 NOTE — Progress Notes (Signed)
 Insulin pump contract signed by patient. Patient verbalized understanding and agreement of policy regarding use of insulin pump.

## 2024-01-27 NOTE — Consult Note (Addendum)
 Renee Howell 01-Jan-1966  [id number].    Requesting MD: Rhea Bleacher Chief Complaint/Reason for Consult: abdominal pain  HPI:  58 yo female underwent abdominoplasty 3 months ago by Dr. Arita Miss. Since then she has had intermittent swelling in her abdomen and a small opening in the center of her lower abdominal scar. She has been putting a dry bandage over this area. She has never had antibiotics before.  ROS: Review of Systems  Constitutional: Negative.   HENT: Negative.    Eyes: Negative.   Respiratory: Negative.    Cardiovascular: Negative.   Gastrointestinal:  Positive for abdominal pain.  Genitourinary: Negative.   Musculoskeletal: Negative.   Skin: Negative.   Neurological: Negative.   Endo/Heme/Allergies: Negative.   Psychiatric/Behavioral: Negative.      Family History  Problem Relation Age of Onset   Cancer Mother    Diabetes Father    Bipolar disorder Brother    Dementia Brother    Diabetes Brother    Breast cancer Paternal Grandmother     Past Medical History:  Diagnosis Date   Allergy    CHF (congestive heart failure) (HCC)    Diabetes mellitus    Hypertension    Kidney infection    Ovarian cancer (HCC)    Sleep apnea     Past Surgical History:  Procedure Laterality Date   ABDOMINAL HYSTERECTOMY     CESAREAN SECTION     EYE SURGERY     RIGHT/LEFT HEART CATH AND CORONARY ANGIOGRAPHY N/A 05/26/2017   Procedure: Right/Left Heart Cath and Coronary Angiography;  Surgeon: Dolores Patty, MD;  Location: [facility];  Service: Cardiovascular;  Laterality: N/A;   SHOULDER OPEN ROTATOR CUFF REPAIR  1988    Social History:  reports that she has never smoked. She has never used smokeless tobacco. She reports current alcohol use of about 3.0 standard drinks of alcohol per week. She reports that she does not use drugs.  Allergies:  Allergies  Allergen Reactions   Trulicity [Dulaglutide] Other (See Comments)    Pt states that the trulicity  makes her feel horrible three days after she uses it. States she will never take it again.    Latex Rash    Medications Prior to Admission  Medication Sig Dispense Refill   acetaminophen (TYLENOL) 325 MG tablet Take 650 mg by mouth every 6 (six) hours as needed for mild pain.     atorvastatin (LIPITOR) 10 MG tablet TAKE 1 TABLET BY MOUTH EVERY DAY 90 tablet 3   calcium citrate-vitamin D (CITRACAL+D) 315-200 MG-UNIT tablet Take 1 tablet by mouth 2 (two) times daily.     carvedilol (COREG) 6.25 MG tablet TAKE 1 TABLET BY MOUTH TWICE A DAY WITH FOOD 180 tablet 0   cholecalciferol (VITAMIN D) 1000 units tablet Take 2,000 Units by mouth daily.     CORLANOR 5 MG TABS tablet TAKE 1 TABLET BY MOUTH TWICE A DAY WITH FOOD 180 tablet 3   diazepam (VALIUM) 2 MG tablet Take 1 tablet (2 mg total) by mouth every 6 (six) hours as needed (Dizziness). 15 tablet 0   DULoxetine (CYMBALTA) 30 MG capsule Take 90 mg by mouth daily.     fluticasone (FLONASE) 50 MCG/ACT nasal spray Place 2 sprays into both nostrils daily.     furosemide (LASIX) 20 MG tablet TAKE 1 TABLET DAILY AS NEEDED, WEIGHT GAIN GREATER THAN 3 POUNDS IN 1 DAY OR GREATER THAN 5 POUNDS IN 1 WEEK 90 tablet 3  Insulin Human (INSULIN PUMP) SOLN Inject 55 each into the skin 3 times daily with meals, bedtime and 2 AM. Medication: Novolog, Sliding scale     JARDIANCE 10 MG TABS tablet TAKE 1 TABLET BY MOUTH EVERY DAY 90 tablet 3   losartan (COZAAR) 25 MG tablet Take 0.5 tablets (12.5 mg total) by mouth daily. 45 tablet 3   meclizine (ANTIVERT) 25 MG tablet Take 1 tablet (25 mg total) by mouth 3 (three) times daily as needed for dizziness. 30 tablet 0   Melatonin 1 MG TABS Take 5 mg by mouth at bedtime as needed.      metFORMIN (GLUCOPHAGE) 1000 MG tablet Take 1,000 mg by mouth 2 (two) times daily.     montelukast (SINGULAIR) 10 MG tablet Take 10 mg by mouth daily.  0   OZEMPIC, 0.25 OR 0.5 MG/DOSE, 2 MG/1.5ML SOPN Inject 2 mg into the skin once a week.      pantoprazole (PROTONIX) 20 MG tablet Take 20 mg by mouth daily.     ranitidine (ZANTAC) 150 MG tablet Take 150 mg by mouth daily as needed for heartburn.     spironolactone (ALDACTONE) 25 MG tablet TAKE 1 TABLET BY MOUTH EVERY DAY 90 tablet 3    Physical Exam: Blood pressure 130/63, pulse 78, temperature 98.1 F (36.7 C), resp. rate 16, height 5\' 5"  (1.651 m), weight 62.6 kg, SpO2 100%. Gen: NAd Resp: nonlabored CV: RRR Abd: 2 cm opening in the center of her lower abdominal scar, no erythema of the periumbilical skin Neuro: AOx4  Results for orders placed or performed during the hospital encounter of 01/26/24 (from the past 48 hours)  Lipase, blood     Status: Abnormal   Collection Time: 01/26/24  5:16 PM  Result Value Ref Range   Lipase 64 (H) 11 - 51 U/L    Comment: Performed at [facility], [address], [place], [place] [address]  Comprehensive metabolic panel     Status: Abnormal   Collection Time: 01/26/24  5:16 PM  Result Value Ref Range   Sodium 138 135 - 145 mmol/L   Potassium 4.3 3.5 - 5.1 mmol/L   Chloride 100 98 - 111 mmol/L   CO2 24 22 - 32 mmol/L   Glucose, Bld 121 (H) 70 - 99 mg/dL    Comment: Glucose reference range applies only to samples taken after fasting for at least 8 hours.   BUN 24 (H) 6 - 20 mg/dL   Creatinine, Ser 1.91 0.44 - 1.00 mg/dL   Calcium 47.8 8.9 - 29.5 mg/dL   Total Protein 7.9 6.5 - 8.1 g/dL   Albumin 4.9 3.5 - 5.0 g/dL   AST 16 15 - 41 U/L   ALT 13 0 - 44 U/L   Alkaline Phosphatase 71 38 - 126 U/L   Total Bilirubin 0.5 0.0 - 1.2 mg/dL   GFR, Estimated >62 >13 mL/min    Comment: (NOTE) Calculated using the CKD-EPI Creatinine Equation (2021)    Anion gap 14 5 - 15    Comment: Performed at [facility], [address] [place], [place], [place] [address]  CBC     Status: Abnormal   Collection Time: 01/26/24  5:16 PM  Result Value Ref Range   WBC 8.8 4.0 - 10.5 K/uL   RBC 5.02 3.87 - 5.11 MIL/uL    Hemoglobin 10.9 (L) 12.0 - 15.0 g/dL   HCT 84.6 96.2 - 95.2 %   MCV 71.9 (L) 80.0 - 100.0 fL  MCH 21.7 (L) 26.0 - 34.0 pg   MCHC 30.2 30.0 - 36.0 g/dL   RDW 40.9 (H) 81.1 - 91.4 %   Platelets 413 (H) 150 - 400 K/uL   nRBC 0.0 0.0 - 0.2 %    Comment: Performed at [facility], [address] [place], [place] [address]  Urinalysis, Routine w reflex microscopic -Urine, Clean Catch     Status: Abnormal   Collection Time: 01/26/24  8:06 PM  Result Value Ref Range   Color, Urine YELLOW YELLOW   APPearance CLEAR CLEAR   Specific Gravity, Urine >1.046 (H) 1.005 - 1.030   pH 7.0 5.0 - 8.0   Glucose, UA >1,000 (A) NEGATIVE mg/dL   Hgb urine dipstick NEGATIVE NEGATIVE   Bilirubin Urine NEGATIVE NEGATIVE   Ketones, ur 40 (A) NEGATIVE mg/dL   Protein, ur TRACE (A) NEGATIVE mg/dL   Nitrite NEGATIVE NEGATIVE   Leukocytes,Ua NEGATIVE NEGATIVE   RBC / HPF 0-5 0 - 5 RBC/hpf   WBC, UA 0-5 0 - 5 WBC/hpf   Bacteria, UA NONE SEEN NONE SEEN   Squamous Epithelial / HPF 0-5 0 - 5 /HPF    Comment: Performed at [facility], [address] [place], [place] [address]  Glucose, capillary     Status: None   Collection Time: 01/27/24  2:46 AM  Result Value Ref Range   Glucose-Capillary 93 70 - 99 mg/dL    Comment: Glucose reference range applies only to samples taken after fasting for at least 8 hours.  Glucose, capillary     Status: None   Collection Time: 01/27/24  6:15 AM  Result Value Ref Range   Glucose-Capillary 87 70 - 99 mg/dL    Comment: Glucose reference range applies only to samples taken after fasting for at least 8 hours.   CT ABDOMEN PELVIS W CONTRAST Result Date: 01/26/2024 CLINICAL DATA:  RLQ abdominal pain EXAM: CT ABDOMEN AND PELVIS WITH CONTRAST TECHNIQUE: Multidetector CT imaging of the abdomen and pelvis was performed using the standard protocol following bolus administration of intravenous contrast. RADIATION DOSE REDUCTION: This exam was  performed according to the departmental dose-optimization program which includes automated exposure control, adjustment of the mA and/or kV according to patient size and/or use of iterative reconstruction technique. CONTRAST:  OMNIPAQUE IOHEXOL 300 MG/ML  SOLN COMPARISON:  None Available. FINDINGS: Lower chest: No acute abnormality. Hepatobiliary: No focal liver abnormality. No gallstones, gallbladder wall thickening, or pericholecystic fluid. No biliary dilatation. Pancreas: No focal lesion. Normal pancreatic contour. No surrounding inflammatory changes. No main pancreatic ductal dilatation. Spleen: Normal in size without focal abnormality. Adrenals/Urinary Tract: No adrenal nodule bilaterally. Bilateral kidneys enhance symmetrically. No hydronephrosis. No hydroureter. The urinary bladder is unremarkable. On delayed imaging, there is no urothelial wall thickening and there are no filling defects in the opacified portions of the bilateral collecting systems or ureters. Stomach/Bowel: Stomach is within normal limits. No evidence of bowel wall thickening or dilatation. Stool throughout the colon. Appendix appears normal. Vascular/Lymphatic: No abdominal aorta or iliac aneurysm. No abdominal, pelvic, or inguinal lymphadenopathy. Reproductive: Status post hysterectomy. No adnexal masses. Other: No intraperitoneal free fluid. No intraperitoneal free gas. No organized fluid collection. Musculoskeletal: Bilateral breast implants partially visualized. Small fat containing umbilical hernia. Interval development of the deep subcutaneus soft tissue fluid collections both superior and inferior to the umbilicus measuring 1.6 x 1.2 x 2.8 cm supraumbilical region and 1.6 x 2.2 x 2.1 cm along the infraumbilical region. Associated peripheral enhancement. Foci  of gas inferiorly along the superficial soft tissues (2:63). No suspicious lytic or blastic osseous lesions. No acute displaced fracture. IMPRESSION: 1. Stool throughout  the colon-correlate for constipation. 2. Small fat containing umbilical hernia. Interval development of the deep subcutaneus soft tissue abscess in the supraumbilical region measuring 1.6 x 1.2 x 2.8 and in the infraumbilical region 1.6 x 2.2 x 2.1 cm. Foci of gas inferiorly along the superficial soft tissues. Recommend correlation with recent surgical procedure. Electronically Signed   By: Tish Frederickson M.D.   On: 01/26/2024 19:06    Assessment/Plan 58 yo female with wound dehiscence and 2 periumbilical cysts after abdominoplasty. Due to lack of erythema or leukocytosis I do not think these fluid collections are active abscesses. I reviewed CT scan images showing a skin dehiscence in the lower abdomen, 2 periumbilical fluid collections and small amount of fluid between the muscle and skin layers -antibiotics -wound packing  FEN - reg diet VTE - lovenox ID - vancomycin per pharmacy Admit - admit to hospitalist for DM control  I reviewed last 24 h vitals and pain scores, last 48 h intake and output, last 24 h labs and trends, and last 24 h imaging results.  De Blanch [facility] 01/27/2024, 6:18 AM Please see Amion for pager number during day hours 7:00am-4:30pm or 7:00am -11:30am on weekends

## 2024-01-28 DIAGNOSIS — E119 Type 2 diabetes mellitus without complications: Secondary | ICD-10-CM | POA: Diagnosis not present

## 2024-01-28 DIAGNOSIS — R1084 Generalized abdominal pain: Secondary | ICD-10-CM | POA: Diagnosis not present

## 2024-01-28 DIAGNOSIS — S31109A Unspecified open wound of abdominal wall, unspecified quadrant without penetration into peritoneal cavity, initial encounter: Secondary | ICD-10-CM | POA: Diagnosis not present

## 2024-01-28 DIAGNOSIS — T8131XA Disruption of external operation (surgical) wound, not elsewhere classified, initial encounter: Secondary | ICD-10-CM | POA: Diagnosis not present

## 2024-01-28 LAB — CBC
HCT: 31.6 % — ABNORMAL LOW (ref 36.0–46.0)
Hemoglobin: 9.5 g/dL — ABNORMAL LOW (ref 12.0–15.0)
MCH: 21.9 pg — ABNORMAL LOW (ref 26.0–34.0)
MCHC: 30.1 g/dL (ref 30.0–36.0)
MCV: 73 fL — ABNORMAL LOW (ref 80.0–100.0)
Platelets: 325 10*3/uL (ref 150–400)
RBC: 4.33 MIL/uL (ref 3.87–5.11)
RDW: 19.9 % — ABNORMAL HIGH (ref 11.5–15.5)
WBC: 6.3 10*3/uL (ref 4.0–10.5)
nRBC: 0 % (ref 0.0–0.2)

## 2024-01-28 LAB — GLUCOSE, CAPILLARY
Glucose-Capillary: 101 mg/dL — ABNORMAL HIGH (ref 70–99)
Glucose-Capillary: 186 mg/dL — ABNORMAL HIGH (ref 70–99)
Glucose-Capillary: 132 mg/dL — ABNORMAL HIGH (ref 70–99)

## 2024-01-28 MED ORDER — SULFAMETHOXAZOLE-TRIMETHOPRIM 800-160 MG PO TABS: 1.0000 | ORAL_TABLET | Freq: Two times a day (BID) | ORAL | 0 refills | Status: AC

## 2024-01-28 MED ORDER — DOCUSATE SODIUM 100 MG PO CAPS
100.0000 mg | ORAL_CAPSULE | Freq: Two times a day (BID) | ORAL | Status: DC
  Administered 2024-01-28: 100 mg via ORAL
  Filled 2024-01-28: qty 1

## 2024-01-28 NOTE — Plan of Care (Signed)

## 2024-01-28 NOTE — Plan of Care (Signed)
  Problem: Skin Integrity: Goal: Risk for impaired skin integrity will decrease Outcome: Not Progressing   Problem: Metabolic: Goal: Ability to maintain appropriate glucose levels will improve Outcome: Not Progressing   Problem: Skin Integrity: Goal: Risk for impaired skin integrity will decrease Outcome: Not Progressing   Problem: Pain Managment: Goal: General experience of comfort will improve and/or be controlled Outcome: Not Progressing

## 2024-01-28 NOTE — Discharge Summary (Signed)
 Physician Discharge Summary  Renee Howell:[id number] DOB: 10-28-1966 DOA: 01/26/2024  PCP: [name], DO  Admit date: 01/26/2024 Discharge date: 01/28/2024  Admitted From: Home Disposition:  Home  Recommendations for Outpatient Follow-up:  Follow up with plastic surgery in 1-2 weeks   Home Health:No Equipment/Devices:None  Discharge Condition:STable CODE STATUS:Full Diet recommendation: Heart Healthy   Brief/Interim Summary: 58 y.o. 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 She denies any fever nausea vomiting or diarrhea.   In the ED: She was found to be afebrile with no leukocytosis was started empirically on IV vancomycin, CT of the abdomen pelvis shows stool throughout the colon umbilical hernia and 2 small regions of fluid collection  Discharge Diagnoses:  Principal Problem:   Open abdominal wall wound, initial encounter Active Problems:   HTN (hypertension)   Type 2 diabetes mellitus without complication, with long-term current use of insulin (HCC)   Chronic systolic CHF now with recovered EF  Abdominal wall wound: General surgery was consulted she will start empirically on antibiotics Afebrile with no leukocytosis. She was draining serosanguineous fluid. She remained afebrile she was changed to oral Bactrim. General surgery recommended treat conservatively and follow-up with plastic surgery as an outpatient. Continue antibiotics as an outpatient.  Essential hypertension/chronic systolic heart failure: No  changes to her medication continue current regimen.  Diabetes mellitus type 2 without complications: No change made to her medication continue current regimen.  Microcytic anemia: Likely iron deficiency anemia, follow-up with PCP as an outpatient.  Mild thrombocytosis: Likely reactive now resolved.  Discharge Instructions  Discharge Instructions     Diet - low sodium heart healthy   Complete by: As directed    Increase activity slowly   Complete by: As directed    No wound care   Complete by: As directed       Allergies as of 01/28/2024       Reactions   Trulicity [dulaglutide] Other (See Comments)   Pt states that the trulicity makes her feel horrible three days after she uses it. States she will never take it again.    Latex Rash        Medication List     TAKE these medications    acetaminophen 325 MG tablet Commonly known as: TYLENOL Take 650 mg by mouth every 6 (six) hours as needed for mild pain.   ALPRAZolam 0.5 MG tablet Commonly known as: XANAX Take 0.5-1 tablets by mouth.   atorvastatin 10 MG tablet Commonly known as: LIPITOR TAKE 1 TABLET BY MOUTH EVERY DAY   busPIRone 15 MG tablet Commonly known as: BUSPAR Take 15 mg by mouth 2 (two) times daily.   calcium citrate-vitamin D 315-200 MG-UNIT tablet Commonly known as: CITRACAL+D Take 1 tablet by mouth 2 (two) times daily.   carvedilol 6.25 MG tablet Commonly known as: COREG TAKE 1 TABLET BY MOUTH TWICE A DAY WITH FOOD   cholecalciferol 1000 units tablet Commonly known as: VITAMIN D Take 2,000 Units by mouth daily.   Corlanor 5 MG Tabs tablet Generic drug: ivabradine TAKE 1 TABLET BY  MOUTH TWICE A DAY WITH FOOD   Dexcom G6 Sensor Misc Inject 1 Application into the skin every 30 (thirty) days.   Dexcom G6 Transmitter Misc Inject 1 applicator into the skin as directed. use as directed   diazepam 2 MG tablet Commonly known as: Valium Take 1 tablet (2 mg total) by mouth every 6 (six)  hours as needed (Dizziness).   DULoxetine 30 MG capsule Commonly known as: CYMBALTA Take 90 mg by mouth daily.   fluticasone 50 MCG/ACT nasal spray Commonly known as: FLONASE Place 2 sprays into both nostrils daily.   furosemide 20 MG tablet Commonly known as: LASIX TAKE 1 TABLET DAILY AS NEEDED, WEIGHT GAIN GREATER THAN 3 POUNDS IN 1 DAY OR GREATER THAN 5 POUNDS IN 1 WEEK   insulin pump Soln Inject 55 each into the skin 3 times daily with meals, bedtime and 2 AM. Medication: Novolog, Sliding scale   Jardiance 10 MG Tabs tablet Generic drug: empagliflozin TAKE 1 TABLET BY MOUTH EVERY DAY   losartan 25 MG tablet Commonly known as: Cozaar Take 0.5 tablets (12.5 mg total) by mouth daily.   meclizine 25 MG tablet Commonly known as: ANTIVERT Take 1 tablet (25 mg total) by mouth 3 (three) times daily as needed for dizziness.   melatonin 1 MG Tabs tablet Take 5 mg by mouth at bedtime as needed.   metFORMIN 1000 MG tablet Commonly known as: GLUCOPHAGE Take 1,000 mg by mouth 2 (two) times daily.   montelukast 10 MG tablet Commonly known as: SINGULAIR Take 10 mg by mouth daily.   NovoLOG 100 UNIT/ML injection Generic drug: insulin aspart Inject into the skin.   Ozempic (1 MG/DOSE) 4 MG/3ML Sopn Generic drug: Semaglutide (1 MG/DOSE) Inject 4 mg into the skin once a week.   pantoprazole 20 MG tablet Commonly known as: PROTONIX Take 20 mg by mouth daily.   ranitidine 150 MG tablet Commonly known as: ZANTAC Take 150 mg by mouth daily as needed for heartburn.   spironolactone 25 MG tablet Commonly known as: ALDACTONE TAKE 1 TABLET BY MOUTH EVERY DAY   sulfamethoxazole-trimethoprim 800-160 MG tablet Commonly known as: BACTRIM DS Take 1 tablet by mouth every 12 (twelve) hours for 5 days.        Allergies  Allergen Reactions   Trulicity [Dulaglutide] Other (See Comments)    Pt states that the trulicity makes her feel horrible three days after she uses it. States  she will never take it again.    Latex Rash    Consultations: General surgery   Procedures/Studies: CT ABDOMEN PELVIS W CONTRAST Result Date: 01/26/2024 CLINICAL DATA:  RLQ abdominal pain EXAM: CT ABDOMEN AND PELVIS WITH CONTRAST TECHNIQUE: Multidetector CT imaging of the abdomen and pelvis was performed using the standard protocol following bolus administration of intravenous contrast. RADIATION DOSE REDUCTION: This exam was performed according to the departmental dose-optimization program which includes automated exposure control, adjustment of the mA and/or kV according to patient size and/or use of iterative reconstruction technique. CONTRAST:  OMNIPAQUE IOHEXOL 300 MG/ML  SOLN COMPARISON:  None Available. FINDINGS: Lower chest: No acute abnormality. Hepatobiliary: No focal liver abnormality. No gallstones, gallbladder wall thickening, or pericholecystic fluid. No biliary dilatation. Pancreas: No focal lesion. Normal pancreatic contour. No surrounding inflammatory changes. No main pancreatic ductal dilatation. Spleen: Normal in size without focal abnormality. Adrenals/Urinary Tract: No adrenal nodule bilaterally. Bilateral kidneys enhance symmetrically. No hydronephrosis. No hydroureter. The urinary bladder is unremarkable. On delayed imaging, there is no urothelial wall  thickening and there are no filling defects in the opacified portions of the bilateral collecting systems or ureters. Stomach/Bowel: Stomach is within normal limits. No evidence of bowel wall thickening or dilatation. Stool throughout the colon. Appendix appears normal. Vascular/Lymphatic: No abdominal aorta or iliac aneurysm. No abdominal, pelvic, or inguinal lymphadenopathy. Reproductive: Status post hysterectomy. No adnexal masses. Other: No intraperitoneal free fluid. No intraperitoneal free gas. No organized fluid collection. Musculoskeletal: Bilateral breast implants partially visualized. Small fat containing umbilical  hernia. Interval development of the deep subcutaneus soft tissue fluid collections both superior and inferior to the umbilicus measuring 1.6 x 1.2 x 2.8 cm supraumbilical region and 1.6 x 2.2 x 2.1 cm along the infraumbilical region. Associated peripheral enhancement. Foci of gas inferiorly along the superficial soft tissues (2:63). No suspicious lytic or blastic osseous lesions. No acute displaced fracture. IMPRESSION: 1. Stool throughout the colon-correlate for constipation. 2. Small fat containing umbilical hernia. Interval development of the deep subcutaneus soft tissue abscess in the supraumbilical region measuring 1.6 x 1.2 x 2.8 and in the infraumbilical region 1.6 x 2.2 x 2.1 cm. Foci of gas inferiorly along the superficial soft tissues. Recommend correlation with recent surgical procedure. Electronically Signed   By: Tish Frederickson M.D.   On: 01/26/2024 19:06     Subjective: No complaints  Discharge Exam: Vitals:   01/28/24 0250 01/28/24 0752  BP: 118/69 124/69  Pulse: 76 84  Resp: 18   Temp: 98.1 F (36.7 C) 97.7 F (36.5 C)  SpO2: 97% 99%   Vitals:   01/27/24 1534 01/27/24 1937 01/28/24 0250 01/28/24 0752  BP: 122/67 123/68 118/69 124/69  Pulse: 82 78 76 84  Resp:  16 18   Temp: 97.9 F (36.6 C) 98.1 F (36.7 C) 98.1 F (36.7 C) 97.7 F (36.5 C)  TempSrc:  Oral Oral Oral  SpO2: 96% 98% 97% 99%  Weight:      Height:        General: Pt is alert, awake, not in acute distress Cardiovascular: RRR, S1/S2 +, no rubs, no gallops Respiratory: CTA bilaterally, no wheezing, no rhonchi Abdominal: Soft, NT, ND, bowel sounds + Extremities: no edema, no cyanosis    The results of significant diagnostics from this hospitalization (including imaging, microbiology, ancillary and laboratory) are listed below for reference.     Microbiology: No results found for this or any previous visit (from the past 240 hours).   Labs: BNP (last 3 results) Recent Labs    05/29/23 1602   BNP 17.2   Basic Metabolic Panel: Recent Labs  Lab 01/26/24 1716 01/27/24 0853  NA 138  --   K 4.3  --   CL 100  --   CO2 24  --   GLUCOSE 121*  --   BUN 24*  --   CREATININE 0.80 0.78  CALCIUM 10.2  --    Liver Function Tests: Recent Labs  Lab 01/26/24 1716  AST 16  ALT 13  ALKPHOS 71  BILITOT 0.5  PROT 7.9  ALBUMIN 4.9   Recent Labs  Lab 01/26/24 1716  LIPASE 64*   No results for input(s): "AMMONIA" in the last 168 hours. CBC: Recent Labs  Lab 01/26/24 1716 01/27/24 0853 01/28/24 0516  WBC 8.8 8.2 6.3  HGB 10.9* 9.8* 9.5*  HCT 36.1 31.7* 31.6*  MCV 71.9* 72.0* 73.0*  PLT 413* 343 325   Cardiac Enzymes: No results for input(s): "CKTOTAL", "CKMB", "CKMBINDEX", "TROPONINI" in the last 168 hours. BNP: Invalid input(s): "POCBNP"  CBG: Recent Labs  Lab 01/27/24 1633 01/27/24 2119 01/28/24 0246 01/28/24 0828 01/28/24 1135  GLUCAP 101* 117* 101* 186* 132*   D-Dimer No results for input(s): "DDIMER" in the last 72 hours. Hgb A1c Recent Labs    01/27/24 0853  HGBA1C 6.6*   Lipid Profile No results for input(s): "CHOL", "HDL", "LDLCALC", "TRIG", "CHOLHDL", "LDLDIRECT" in the last 72 hours. Thyroid function studies No results for input(s): "TSH", "T4TOTAL", "T3FREE", "THYROIDAB" in the last 72 hours.  Invalid input(s): "FREET3" Anemia work up Recent Labs    01/27/24 0853  VITAMINB12 2,672*  FOLATE 33.9  FERRITIN 5*  TIBC 514*  IRON 34  RETICCTPCT 1.1   Urinalysis    Component Value Date/Time   COLORURINE YELLOW 01/26/2024 2006   APPEARANCEUR CLEAR 01/26/2024 2006   LABSPEC >1.046 (H) 01/26/2024 2006   PHURINE 7.0 01/26/2024 2006   GLUCOSEU >1,000 (A) 01/26/2024 2006   HGBUR NEGATIVE 01/26/2024 2006   BILIRUBINUR NEGATIVE 01/26/2024 2006   KETONESUR 40 (A) 01/26/2024 2006   PROTEINUR TRACE (A) 01/26/2024 2006   NITRITE NEGATIVE 01/26/2024 2006   LEUKOCYTESUR NEGATIVE 01/26/2024 2006   Sepsis Labs Recent Labs  Lab  01/26/24 1716 01/27/24 0853 01/28/24 0516  WBC 8.8 8.2 6.3   Microbiology No results found for this or any previous visit (from the past 240 hours).   Time coordinating discharge: Over 35 minutes  SIGNED:   Marinda Elk, MD  [facility] 01/28/2024, 1:28 PM Pager   If 7PM-7AM, please contact night-coverage [email or web address] Password [email or web address]

## 2024-01-28 NOTE — Progress Notes (Signed)
 Subjective/Chief Complaint: Patient reports flatus No nausea or vomiting   Objective: Vital signs in last 24 hours: Temp:  [97.7 F (36.5 C)-98.1 F (36.7 C)] 97.7 F (36.5 C) (03/02 0752) Pulse Rate:  [76-84] 84 (03/02 0752) Resp:  [16-18] 18 (03/02 0250) BP: (118-124)/(62-69) 124/69 (03/02 0752) SpO2:  [96 %-100 %] 99 % (03/02 0752) Last BM Date : 01/26/24  Intake/Output from previous day: 03/01 0701 - 03/02 0700 In: 300 [P.O.:300] Out: -  Intake/Output this shift: No intake/output data recorded.  WDWN in NAD Umbilicus - moved with recent abdominoplasty; cannot palpate the umbilical hernia Lower abdominoplasty incision - central 1.5 cm area of granulation tissue; no cellulitis  Lab Results:  Recent Labs    01/27/24 0853 01/28/24 0516  WBC 8.2 6.3  HGB 9.8* 9.5*  HCT 31.7* 31.6*  PLT 343 325   BMET Recent Labs    01/26/24 1716 01/27/24 0853  NA 138  --   K 4.3  --   CL 100  --   CO2 24  --   GLUCOSE 121*  --   BUN 24*  --   CREATININE 0.80 0.78  CALCIUM 10.2  --    PT/INR No results for input(s): "LABPROT", "INR" in the last 72 hours. ABG No results for input(s): "PHART", "HCO3" in the last 72 hours.  Invalid input(s): "PCO2", "PO2"  Studies/Results: CT ABDOMEN PELVIS W CONTRAST Result Date: 01/26/2024 CLINICAL DATA:  RLQ abdominal pain EXAM: CT ABDOMEN AND PELVIS WITH CONTRAST TECHNIQUE: Multidetector CT imaging of the abdomen and pelvis was performed using the standard protocol following bolus administration of intravenous contrast. RADIATION DOSE REDUCTION: This exam was performed according to the departmental dose-optimization program which includes automated exposure control, adjustment of the mA and/or kV according to patient size and/or use of iterative reconstruction technique. CONTRAST:  OMNIPAQUE IOHEXOL 300 MG/ML  SOLN COMPARISON:  None Available. FINDINGS: Lower chest: No acute abnormality. Hepatobiliary: No focal liver  abnormality. No gallstones, gallbladder wall thickening, or pericholecystic fluid. No biliary dilatation. Pancreas: No focal lesion. Normal pancreatic contour. No surrounding inflammatory changes. No main pancreatic ductal dilatation. Spleen: Normal in size without focal abnormality. Adrenals/Urinary Tract: No adrenal nodule bilaterally. Bilateral kidneys enhance symmetrically. No hydronephrosis. No hydroureter. The urinary bladder is unremarkable. On delayed imaging, there is no urothelial wall thickening and there are no filling defects in the opacified portions of the bilateral collecting systems or ureters. Stomach/Bowel: Stomach is within normal limits. No evidence of bowel wall thickening or dilatation. Stool throughout the colon. Appendix appears normal. Vascular/Lymphatic: No abdominal aorta or iliac aneurysm. No abdominal, pelvic, or inguinal lymphadenopathy. Reproductive: Status post hysterectomy. No adnexal masses. Other: No intraperitoneal free fluid. No intraperitoneal free gas. No organized fluid collection. Musculoskeletal: Bilateral breast implants partially visualized. Small fat containing umbilical hernia. Interval development of the deep subcutaneus soft tissue fluid collections both superior and inferior to the umbilicus measuring 1.6 x 1.2 x 2.8 cm supraumbilical region and 1.6 x 2.2 x 2.1 cm along the infraumbilical region. Associated peripheral enhancement. Foci of gas inferiorly along the superficial soft tissues (2:63). No suspicious lytic or blastic osseous lesions. No acute displaced fracture. IMPRESSION: 1. Stool throughout the colon-correlate for constipation. 2. Small fat containing umbilical hernia. Interval development of the deep subcutaneus soft tissue abscess in the supraumbilical region measuring 1.6 x 1.2 x 2.8 and in the infraumbilical region 1.6 x 2.2 x 2.1 cm. Foci of gas inferiorly along the superficial soft tissues. Recommend correlation with recent  surgical procedure.  Electronically Signed   By: Tish Frederickson M.D.   On: 01/26/2024 19:06    Anti-infectives: Anti-infectives (From admission, onward)    Start     Dose/Rate Route Frequency Ordered Stop   01/27/24 1100  vancomycin (VANCOREADY) IVPB 750 mg/150 mL  Status:  Discontinued        750 mg 150 mL/hr over 60 Minutes Intravenous Every 12 hours 01/27/24 0628 01/27/24 0834   01/27/24 1000  sulfamethoxazole-trimethoprim (BACTRIM DS) 800-160 MG per tablet 1 tablet        1 tablet Oral Every 12 hours 01/27/24 0834     01/26/24 2300  vancomycin (VANCOCIN) 500 mg in sodium chloride 0.9 % 100 mL IVPB       Placed in "Followed by" Linked Group   500 mg 100 mL/hr over 60 Minutes Intravenous  Once 01/26/24 2120 01/26/24 2351   01/26/24 2130  vancomycin (VANCOCIN) IVPB 1000 mg/200 mL premix       Placed in "Followed by" Linked Group   1,000 mg 200 mL/hr over 60 Minutes Intravenous  Once 01/26/24 2120 01/26/24 2242       Assessment/Plan: Recent abdominoplasty with superficial wound dehiscence Two periumbilical fluid collections - no sign of abscess Fat-containing umbilical hernia - no sign of bowel involvement Chronic constipation  Continue bowel regimen - added BID Colace to Miralax Follow-up with Dr. Arita Miss for wound management Advance diet as tolerated No surgical indications.  LOS: 1 day    Wynona Luna 01/28/2024

## 2024-02-08 DIAGNOSIS — E1165 Type 2 diabetes mellitus with hyperglycemia: Secondary | ICD-10-CM | POA: Diagnosis not present

## 2024-02-08 DIAGNOSIS — E7800 Pure hypercholesterolemia, unspecified: Secondary | ICD-10-CM | POA: Diagnosis not present

## 2024-02-15 DIAGNOSIS — E1165 Type 2 diabetes mellitus with hyperglycemia: Secondary | ICD-10-CM | POA: Diagnosis not present

## 2024-02-15 DIAGNOSIS — I10 Essential (primary) hypertension: Secondary | ICD-10-CM | POA: Diagnosis not present

## 2024-02-15 DIAGNOSIS — Z9641 Presence of insulin pump (external) (internal): Secondary | ICD-10-CM | POA: Diagnosis not present

## 2024-02-15 DIAGNOSIS — E7800 Pure hypercholesterolemia, unspecified: Secondary | ICD-10-CM | POA: Diagnosis not present

## 2024-03-06 ENCOUNTER — Other Ambulatory Visit (HOSPITAL_COMMUNITY): Payer: Self-pay | Admitting: Internal Medicine

## 2024-04-10 ENCOUNTER — Other Ambulatory Visit (HOSPITAL_COMMUNITY): Payer: Self-pay | Admitting: Internal Medicine

## 2024-04-10 DIAGNOSIS — M25512 Pain in left shoulder: Secondary | ICD-10-CM | POA: Diagnosis not present

## 2024-04-10 DIAGNOSIS — M542 Cervicalgia: Secondary | ICD-10-CM | POA: Diagnosis not present

## 2024-04-18 DIAGNOSIS — M25512 Pain in left shoulder: Secondary | ICD-10-CM | POA: Diagnosis not present

## 2024-04-23 ENCOUNTER — Other Ambulatory Visit (HOSPITAL_COMMUNITY): Payer: Self-pay | Admitting: Internal Medicine

## 2024-05-01 ENCOUNTER — Telehealth: Payer: Self-pay | Admitting: *Deleted

## 2024-05-01 DIAGNOSIS — M25512 Pain in left shoulder: Secondary | ICD-10-CM | POA: Diagnosis not present

## 2024-05-01 NOTE — Telephone Encounter (Signed)
   Pre-operative Risk Assessment    Patient Name: Renee Howell  DOB: 1966-06-16 MRN: [id number]   Date of last office visit: 06/21/23 JESSICA MILFORD, FNP Date of next office visit: NONE   Request for Surgical Clearance    Procedure:  LEFT SHOULDER SCOPE WITH BICEPS TENODESIS   Date of Surgery:  Clearance TBD                                Surgeon:  DR. Randal Bury Surgeon's Group or Practice Name:  [facility] Phone number:  [phone] [address]T [address] Fax number:  [phone]   Type of Clearance Requested:   - Medical ; NONE INDICATED ON FORM TO BE HELD   Type of Anesthesia:  General  WITH INTERSCALENE BLOCK   Additional requests/questions:    Princ[place] Broom   05/01/2024, 2:58 PM

## 2024-05-01 NOTE — Telephone Encounter (Signed)
   Name: Renee Howell  DOB: 1966-02-09  MRN: [id number]  Primary Cardiologist: None  Chart reviewed as part of pre-operative protocol coverage. Because of Renee Howell past medical history and time since last visit, she will require a follow-up in-office visit in order to better assess preoperative cardiovascular risk.  Patient currently followed by advanced heart failure clinic  Pre-op  covering staff: - Please schedule appointment and call patient to inform them. If patient already had an upcoming appointment within acceptable timeframe, please add "pre-op  clearance" to the appointment notes so provider is aware. - Please contact requesting surgeon's office via preferred method (i.e, phone, fax) to inform them of need for appointment prior to surgery.   Francene Ing, Retha Cast, NP  05/01/2024, 3:07 PM

## 2024-05-01 NOTE — Telephone Encounter (Signed)
 Pt is followed by the [facility] Failure Clinic. I will send a message to the [facility] scheduling team to reach out to the pt with an appt for preop clearance.   I will send FYI to the requesting office the pt needs an IN OFFICE appt with Heart Failure clinic.

## 2024-05-02 DIAGNOSIS — G4733 Obstructive sleep apnea (adult) (pediatric): Secondary | ICD-10-CM | POA: Diagnosis not present

## 2024-05-02 DIAGNOSIS — L918 Other hypertrophic disorders of the skin: Secondary | ICD-10-CM | POA: Diagnosis not present

## 2024-05-02 DIAGNOSIS — E119 Type 2 diabetes mellitus without complications: Secondary | ICD-10-CM | POA: Diagnosis not present

## 2024-05-02 DIAGNOSIS — Z85828 Personal history of other malignant neoplasm of skin: Secondary | ICD-10-CM | POA: Diagnosis not present

## 2024-05-02 DIAGNOSIS — K5909 Other constipation: Secondary | ICD-10-CM | POA: Diagnosis not present

## 2024-05-02 DIAGNOSIS — E785 Hyperlipidemia, unspecified: Secondary | ICD-10-CM | POA: Diagnosis not present

## 2024-05-02 DIAGNOSIS — L820 Inflamed seborrheic keratosis: Secondary | ICD-10-CM | POA: Diagnosis not present

## 2024-05-02 DIAGNOSIS — D2372 Other benign neoplasm of skin of left lower limb, including hip: Secondary | ICD-10-CM | POA: Diagnosis not present

## 2024-05-02 DIAGNOSIS — E782 Mixed hyperlipidemia: Secondary | ICD-10-CM | POA: Diagnosis not present

## 2024-05-02 DIAGNOSIS — L821 Other seborrheic keratosis: Secondary | ICD-10-CM | POA: Diagnosis not present

## 2024-05-03 NOTE — Telephone Encounter (Signed)
 Will update Dr. Erma Hay office the pt needs appt with the [facility] Failure Clinic for preop clearance.

## 2024-05-06 ENCOUNTER — Telehealth (HOSPITAL_COMMUNITY): Payer: Self-pay

## 2024-05-06 NOTE — Telephone Encounter (Signed)
 Called to confirm/remind patient of their appointment at the [facility] on 05/07/2024 3:30.   Appointment:   [x] Confirmed  [] Left mess   [] No answer/No voice mail  [] VM Full/unable to leave message  [] Phone not in service  Patient reminded to bring all medications and/or complete list.  Confirmed patient has transportation. Gave directions, instructed to utilize valet parking.

## 2024-05-06 NOTE — Telephone Encounter (Signed)
 Pt has appt 05/07/24 with [facility]. I will update all parties of appt.

## 2024-05-07 ENCOUNTER — Ambulatory Visit (HOSPITAL_COMMUNITY)
Admission: RE | Admit: 2024-05-07 | Discharge: 2024-05-07 | Disposition: A | Discharge status: Home / Self Care | Source: Ambulatory Visit | Attending: Physician Assistant | Admitting: Physician Assistant

## 2024-05-07 ENCOUNTER — Encounter (HOSPITAL_COMMUNITY): Payer: Self-pay

## 2024-05-07 VITALS — BP 116/72 | HR 88 | Ht 72.0 in | Wt 160.0 lb

## 2024-05-07 DIAGNOSIS — I428 Other cardiomyopathies: Secondary | ICD-10-CM | POA: Diagnosis not present

## 2024-05-07 DIAGNOSIS — G4733 Obstructive sleep apnea (adult) (pediatric): Secondary | ICD-10-CM | POA: Insufficient documentation

## 2024-05-07 DIAGNOSIS — I110 Hypertensive heart disease with heart failure: Secondary | ICD-10-CM | POA: Insufficient documentation

## 2024-05-07 DIAGNOSIS — R000 Tachycardia, unspecified: Secondary | ICD-10-CM | POA: Diagnosis not present

## 2024-05-07 DIAGNOSIS — I5022 Chronic systolic (congestive) heart failure: Secondary | ICD-10-CM | POA: Insufficient documentation

## 2024-05-07 DIAGNOSIS — E118 Type 2 diabetes mellitus with unspecified complications: Secondary | ICD-10-CM | POA: Diagnosis not present

## 2024-05-07 DIAGNOSIS — Z7985 Long-term (current) use of injectable non-insulin antidiabetic drugs: Secondary | ICD-10-CM | POA: Insufficient documentation

## 2024-05-07 DIAGNOSIS — Z794 Long term (current) use of insulin: Secondary | ICD-10-CM | POA: Insufficient documentation

## 2024-05-07 DIAGNOSIS — Z79899 Other long term (current) drug therapy: Secondary | ICD-10-CM | POA: Diagnosis not present

## 2024-05-07 DIAGNOSIS — Z9641 Presence of insulin pump (external) (internal): Secondary | ICD-10-CM | POA: Diagnosis not present

## 2024-05-07 DIAGNOSIS — E119 Type 2 diabetes mellitus without complications: Secondary | ICD-10-CM | POA: Insufficient documentation

## 2024-05-07 DIAGNOSIS — F5101 Primary insomnia: Secondary | ICD-10-CM | POA: Insufficient documentation

## 2024-05-07 DIAGNOSIS — Z01810 Encounter for preprocedural cardiovascular examination: Secondary | ICD-10-CM

## 2024-05-07 LAB — BASIC METABOLIC PANEL WITH GFR
Anion gap: 5 (ref 5–15)
BUN: 25 mg/dL — ABNORMAL HIGH (ref 6–20)
CO2: 28 mmol/L (ref 22–32)
Calcium: 9.4 mg/dL (ref 8.9–10.3)
Chloride: 104 mmol/L (ref 98–111)
Creatinine, Ser: 0.83 mg/dL (ref 0.44–1.00)
GFR, Estimated: >60 mL/min (ref >60)
Glucose, Bld: 175 mg/dL — ABNORMAL HIGH (ref 70–99)
Potassium: 4.4 mmol/L (ref 3.5–5.1)
Sodium: 137 mmol/L (ref 135–145)

## 2024-05-07 LAB — BRAIN NATRIURETIC PEPTIDE: B Natriuretic Peptide: 11.6 pg/mL (ref 0.0–100.0)

## 2024-05-07 NOTE — Patient Instructions (Signed)
 There has been no changes to your medications.  Labs done today, your results will be available in MyChart, we will contact you for abnormal readings.  Your physician has requested that you have an echocardiogram. Echocardiography is a painless test that uses sound waves to create images of your heart. It provides your doctor with information about the size and shape of your heart and how well your heart's chambers and valves are working. This procedure takes approximately one hour. There are no restrictions for this procedure. Please do NOT wear cologne, perfume, aftershave, or lotions (deodorant is allowed). Please arrive 15 minutes prior to your appointment time.  Please note: We ask at that you not bring children with you during ultrasound (echo/ vascular) testing. Due to room size and safety concerns, children are not allowed in the ultrasound rooms during exams. Our front office staff cannot provide observation of children in our lobby area while testing is being conducted. An adult accompanying a patient to their appointment will only be allowed in the ultrasound room at the discretion of the ultrasound technician under special circumstances. We apologize for any inconvenience.  Your physician recommends that you schedule a follow-up appointment in: 1 Year ( June 2026) ** PLEASE CALL THE OFFICE IN MARCH 2026 TO ARRANGE YOUR FOLLOW UP APPOINTMENT.**  If you have any questions or concerns before your next appointment please send us  a message through [place] or call our office at [phone].    TO LEAVE A MESSAGE FOR THE NURSE SELECT OPTION 2, PLEASE LEAVE A MESSAGE INCLUDING: YOUR NAME DATE OF BIRTH CALL BACK NUMBER REASON FOR CALL**this is important as we prioritize the call backs  YOU WILL RECEIVE A CALL BACK THE SAME DAY AS LONG AS YOU CALL BEFORE 4:00 PM  At the [facility], you and your health needs are our priority. As part of our continuing mission to provide you  with exceptional heart care, we have created designated Provider Care Teams. These Care Teams include your primary Cardiologist (physician) and Advanced Practice Providers (APPs- Physician Assistants and Nurse Practitioners) who all work together to provide you with the care you need, when you need it.   You may see any of the following providers on your designated Care Team at your next follow up: Dr Jules Oar Dr Peder Bourdon Dr. Alwin Baars Dr. Arta Lark Amy Marijane Shoulders, NP Ruddy Corral, [place] [facility] [place], [place] Dennise Fitz, NP [place] Lee, NP Shawnee Dellen, NP Luster Salters, PharmD Bevely Brush, PharmD   Please be sure to bring in all your medications bottles to every appointment.    Thank you for choosing [facility]

## 2024-05-07 NOTE — Progress Notes (Incomplete)
 Advanced Heart Failure Clinic Note   PCP: Jinger Mount, DO (Inactive) Primary Cardiologist: Dr. Julane Ny   HPI: Renee Howell is a 58 y.o. female with a h/o obesity, HTN. ADD and poorly-controlled DM2, chronic systolic CHF (EF 16-10%).   She was diagnosed with systolic CHF d/t NICM in June 9604. Found to have an EF of 15-20%. Required milrinone  for low output HF. Diuresed over 20 pounds. RHC showed Fick output/index 4.4/2.2 on 0.125 mcg Milrinone . Cardiac MRI without infiltrative disease. It was felt that her cardiomyopathy was related to either undiagnosed sleep apnea, viral or tachy induced (she has been on stimulants for ADD for a long time). Her stimulants were stopped at discharge. Discharge weight was 197 pounds.    Echo 3/21 EF 55-60% Grade I DD. RV normal.   Echo 9/22 EF 60-65%. RV ok   Follow up 7/24 after syncopal episode. + orthostatic, Reds 24%. Zio and echo ordered, Entresto  stopped and losartan  12.5 started.  Zio (7/24) showed mostly NSR, 1 run of NSVT 7 beats.  Echo 07/24: EF 50-55%, RV okay  Here today for CHF follow-up and cardiac risk assessment for left shoulder scope with biceps tenodesis. Having a lot of shoulder pain. Reports she had abdominoplasty last fall under general anesthesia without issue. Denies dyspnea, orthopnea, PND or lower extremity edema. Can walk at least a mile without limitation. Tracks over 2 miles a day on her apple app. Her ozempic was recently cut back and she gained about 20 lbs of the 90 lbs she had lost.    Past Medical History:  Diagnosis Date   Allergy    CHF (congestive heart failure) (HCC)    Diabetes mellitus    Hypertension    Kidney infection    Ovarian cancer (HCC)    Sleep apnea     Current Outpatient Medications  Medication Sig Dispense Refill   acetaminophen  (TYLENOL ) 325 MG tablet Take 650 mg by mouth every 6 (six) hours as needed for mild pain.     ALPRAZolam  (XANAX ) 0.5 MG tablet Take 0.5-1 tablets by  mouth.     atorvastatin  (LIPITOR) 10 MG tablet TAKE 1 TABLET BY MOUTH EVERY DAY 90 tablet 3   busPIRone (BUSPAR) 15 MG tablet Take 15 mg by mouth 2 (two) times daily.     calcium  citrate-vitamin D  (CITRACAL+D) 315-200 MG-UNIT tablet Take 1 tablet by mouth 2 (two) times daily.     carvedilol  (COREG ) 6.25 MG tablet TAKE 1 TABLET BY MOUTH TWICE A DAY WITH FOOD 180 tablet 0   cholecalciferol  (VITAMIN D ) 1000 units tablet Take 2,000 Units by mouth daily.     Continuous Glucose Sensor (DEXCOM G6 SENSOR) MISC Inject 1 Application into the skin every 30 (thirty) days.     Continuous Glucose Transmitter (DEXCOM G6 TRANSMITTER) MISC Inject 1 applicator into the skin as directed. use as directed     diazepam  (VALIUM ) 2 MG tablet Take 1 tablet (2 mg total) by mouth every 6 (six) hours as needed (Dizziness). 15 tablet 0   DULoxetine  (CYMBALTA ) 30 MG capsule Take 90 mg by mouth daily.     fluticasone  (FLONASE ) 50 MCG/ACT nasal spray Place 2 sprays into both nostrils daily.     furosemide  (LASIX ) 20 MG tablet TAKE 1 TABLET DAILY AS NEEDED, WEIGHT GAIN GREATER THAN 3 POUNDS IN 1 DAY OR GREATER THAN 5 POUNDS IN 1 WEEK 90 tablet 3   Insulin  Human (INSULIN  PUMP) SOLN Inject 55 each into the skin 3 times  daily with meals, bedtime and 2 AM. Medication: Novolog , Sliding scale     ivabradine  (CORLANOR ) 5 MG TABS tablet TAKE 1 TABLET BY MOUTH TWICE A DAY WITH FOOD 180 tablet 3   losartan  (COZAAR ) 25 MG tablet Take 0.5 tablets (12.5 mg total) by mouth daily. 45 tablet 3   meclizine  (ANTIVERT ) 25 MG tablet Take 1 tablet (25 mg total) by mouth 3 (three) times daily as needed for dizziness. 30 tablet 0   Melatonin 1 MG TABS Take 5 mg by mouth at bedtime as needed.      metFORMIN (GLUCOPHAGE) 1000 MG tablet Take 1,000 mg by mouth 2 (two) times daily.     montelukast (SINGULAIR) 10 MG tablet Take 10 mg by mouth daily.  0   NOVOLOG  100 UNIT/ML injection Inject into the skin.     pantoprazole  (PROTONIX ) 20 MG tablet Take 20 mg  by mouth daily.     Semaglutide, 1 MG/DOSE, (OZEMPIC, 1 MG/DOSE,) 4 MG/3ML SOPN Inject 4 mg into the skin once a week.     spironolactone  (ALDACTONE ) 25 MG tablet TAKE 1 TABLET BY MOUTH EVERY DAY 90 tablet 3   empagliflozin  (JARDIANCE ) 10 MG TABS tablet TAKE 1 TABLET BY MOUTH EVERY DAY (Patient not taking: Reported on 05/07/2024) 90 tablet 0   No current facility-administered medications for this encounter.    Allergies  Allergen Reactions   Trulicity [Dulaglutide] Other (See Comments)    Pt states that the trulicity makes her feel horrible three days after she uses it. States she will never take it again.    Latex Rash   Social History   Socioeconomic History   Marital status: Married    Spouse name: Not on file   Number of children: 2   Years of education: Not on file   Highest education level: Not on file  Occupational History   Occupation: unemployed  Tobacco Use   Smoking status: Never   Smokeless tobacco: Never  Vaping Use   Vaping status: Never Used  Substance and Sexual Activity   Alcohol use: Yes    Alcohol/week: 3.0 standard drinks of alcohol    Types: 2 Glasses of wine, 1 Standard drinks or equivalent per week    Comment: occ   Drug use: No   Sexual activity: Not on file  Other Topics Concern   Not on file  Social History Narrative   Right Handed    Lives in a two story home    Social Drivers of Health   Financial Resource Strain: Not on file  Food Insecurity: No Food Insecurity (01/27/2024)   Hunger Vital Sign    Worried About Running Out of Food in the Last Year: Never true    Ran Out of Food in the Last Year: Never true  Transportation Needs: No Transportation Needs (01/27/2024)   PRAPARE - Administrator, Civil Service (Medical): No    Lack of Transportation (Non-Medical): No  Physical Activity: Not on file  Stress: Not on file  Social Connections: Not on file  Intimate Partner Violence: Not At Risk (01/27/2024)   Humiliation, Afraid, Rape,  and Kick questionnaire    Fear of Current or Ex-Partner: No    Emotionally Abused: No    Physically Abused: No    Sexually Abused: No   Family History  Problem Relation Age of Onset   Cancer Mother    Diabetes Father    Bipolar disorder Brother    Dementia Brother  Diabetes Brother    Breast cancer Paternal Grandmother    BP 116/72   Pulse 88   Ht 6' (1.829 m)   Wt 72.6 kg (160 lb)   SpO2 97%   BMI 21.70 kg/m   Wt Readings from Last 3 Encounters:  05/07/24 72.6 kg (160 lb)  01/26/24 62.6 kg (138 lb)  07/10/23 64 kg (141 lb)   PHYSICAL EXAM: General:  Well appearing.  Neck: no JVD.  Cor: Regular rate & rhythm. No rubs, gallops or murmurs. Lungs: clear Abdomen: soft, nontender, nondistended.  Extremities: no edema Neuro: alert & orientedx3. Affect pleasant  ECG today: SR 81 bpm  ASSESSMENT & PLAN: 1. Chronic systolic CHF with recovered EF - Echo EF 15% with biventricular dysfunction.  - Suspect viral CM vs.stimulants vs OSA  - Normal cors by cath in 04/2017.  - Cardiac MRI without infiltrative disease. 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 RV normal. - Complete recovery with medical therapy - Echo 02/26/20 EF 55-60% Grade I DD. RV normal.  - Echo 9/22 EF 60-65% - Echo 7/24: EF 50-55% - NYHA II. Volume looks good. Has not needed lasix . 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  - Repeat echo ordered - Labs today - Planning to undergo left shoulder scope with biceps tenodesis under general anesthesia with Dr. Randal Bury at [facility]. Risk is likely at least moderately increased but not prohibitive.  Perioperative risk for cardiac complications using RCRI is 10.1%.   2. H/o of Syncope - Previously orthostatic - No further events. - Feels better off Entresto  - Zio 7/24 mostly NSR, 1 short run of NSVT  3. DM - On insulin  pump and Dexcom.  - HgbA1c 6.6 in 03/25 - See above regarding Jardiance  - Follows with Dr. Ronelle Coffee   4. ADD - No longer on stimulants.  - Would avoid in future if possible   5. Mild OSA with idiopathic insomnia - Follows with Dr. Cherlyn Cornet.  - Likely has component of narcolepsy.   6. Sinus tach - Much improved - Zio 7/24 mostly NSR, average rate 79 - Continue Corlanor  5 mg bid and carvedilol .   7. H/o of Obesity  - Body mass index is 21.7 kg/m. - Lost 90 lb with Ozempic, has gained some weight back with lower dose of Ozempic. Plans to talk to her endocrinologist about going back up to prior dose.  Follow up 1 year  [facility], Angelena Barber, PA-C 05/07/24

## 2024-05-08 ENCOUNTER — Ambulatory Visit (HOSPITAL_COMMUNITY): Payer: Self-pay | Admitting: Physician Assistant

## 2024-05-19 ENCOUNTER — Other Ambulatory Visit (HOSPITAL_COMMUNITY): Payer: Self-pay | Admitting: Internal Medicine

## 2024-05-19 DIAGNOSIS — I5022 Chronic systolic (congestive) heart failure: Secondary | ICD-10-CM

## 2024-05-22 ENCOUNTER — Ambulatory Visit (HOSPITAL_COMMUNITY)
Admission: RE | Admit: 2024-05-22 | Discharge: 2024-05-22 | Disposition: A | Discharge status: Home / Self Care | Source: Ambulatory Visit | Attending: Physician Assistant | Admitting: Physician Assistant

## 2024-05-22 DIAGNOSIS — I340 Nonrheumatic mitral (valve) insufficiency: Secondary | ICD-10-CM | POA: Diagnosis not present

## 2024-05-22 DIAGNOSIS — I110 Hypertensive heart disease with heart failure: Secondary | ICD-10-CM | POA: Insufficient documentation

## 2024-05-22 DIAGNOSIS — I5022 Chronic systolic (congestive) heart failure: Secondary | ICD-10-CM | POA: Diagnosis not present

## 2024-05-22 LAB — ECHOCARDIOGRAM COMPLETE
Area-P 1/2: 5.62 cm2
Calc EF: 52.7 %
Est EF: 55
S' Lateral: 3.3 cm
Single Plane A2C EF: 61.4 %
Single Plane A4C EF: 45.1 %

## 2024-05-23 ENCOUNTER — Other Ambulatory Visit: Payer: Self-pay | Admitting: Family Medicine

## 2024-05-23 DIAGNOSIS — Z1231 Encounter for screening mammogram for malignant neoplasm of breast: Secondary | ICD-10-CM

## 2024-05-28 DIAGNOSIS — R112 Nausea with vomiting, unspecified: Secondary | ICD-10-CM | POA: Diagnosis not present

## 2024-05-28 DIAGNOSIS — R159 Full incontinence of feces: Secondary | ICD-10-CM | POA: Diagnosis not present

## 2024-05-28 DIAGNOSIS — K219 Gastro-esophageal reflux disease without esophagitis: Secondary | ICD-10-CM | POA: Diagnosis not present

## 2024-05-28 DIAGNOSIS — K5904 Chronic idiopathic constipation: Secondary | ICD-10-CM | POA: Diagnosis not present

## 2024-05-28 NOTE — Telephone Encounter (Signed)
 Received 2nd Preop Clearance request. Will Route to Preop App for review.

## 2024-05-28 NOTE — Telephone Encounter (Signed)
   Patient Name: Renee Howell  DOB: 1966/01/01 MRN: [id number]  Primary Cardiologist: None  Chart reviewed as part of pre-operative protocol coverage. Given past medical history and time since last visit, based on ACC/AHA guidelines, Renee Howell is at acceptable risk for the planned procedure without further cardiovascular testing.   The patient was advised that if she develops new symptoms prior to surgery to contact our office to arrange for a follow-up visit, and she verbalized understanding.  I will route this recommendation to the requesting party via Epic fax function and remove from pre-op  pool.  Please call with questions.  Wyn Raddle, Jackee Shove, NP 05/28/2024, 1:46 PM

## 2024-06-06 ENCOUNTER — Ambulatory Visit
Admission: RE | Admit: 2024-06-06 | Discharge: 2024-06-06 | Disposition: A | Discharge status: Home / Self Care | Source: Ambulatory Visit | Attending: Family Medicine | Admitting: Family Medicine

## 2024-06-06 DIAGNOSIS — Z1231 Encounter for screening mammogram for malignant neoplasm of breast: Secondary | ICD-10-CM

## 2024-06-07 ENCOUNTER — Other Ambulatory Visit: Payer: Self-pay | Admitting: Family Medicine

## 2024-06-07 DIAGNOSIS — N63 Unspecified lump in unspecified breast: Secondary | ICD-10-CM

## 2024-06-07 DIAGNOSIS — N644 Mastodynia: Secondary | ICD-10-CM

## 2024-06-20 DIAGNOSIS — M7522 Bicipital tendinitis, left shoulder: Secondary | ICD-10-CM | POA: Diagnosis not present

## 2024-06-20 DIAGNOSIS — M7542 Impingement syndrome of left shoulder: Secondary | ICD-10-CM | POA: Diagnosis not present

## 2024-06-20 DIAGNOSIS — M24012 Loose body in left shoulder: Secondary | ICD-10-CM | POA: Diagnosis not present

## 2024-06-20 DIAGNOSIS — G8918 Other acute postprocedural pain: Secondary | ICD-10-CM | POA: Diagnosis not present

## 2024-06-20 DIAGNOSIS — M19012 Primary osteoarthritis, left shoulder: Secondary | ICD-10-CM | POA: Diagnosis not present

## 2024-06-20 DIAGNOSIS — M24112 Other articular cartilage disorders, left shoulder: Secondary | ICD-10-CM | POA: Diagnosis not present

## 2024-06-24 DIAGNOSIS — M6281 Muscle weakness (generalized): Secondary | ICD-10-CM | POA: Diagnosis not present

## 2024-06-24 DIAGNOSIS — S46012D Strain of muscle(s) and tendon(s) of the rotator cuff of left shoulder, subsequent encounter: Secondary | ICD-10-CM | POA: Diagnosis not present

## 2024-06-24 DIAGNOSIS — M25612 Stiffness of left shoulder, not elsewhere classified: Secondary | ICD-10-CM | POA: Diagnosis not present

## 2024-06-24 DIAGNOSIS — M7522 Bicipital tendinitis, left shoulder: Secondary | ICD-10-CM | POA: Diagnosis not present

## 2024-06-28 DIAGNOSIS — M7522 Bicipital tendinitis, left shoulder: Secondary | ICD-10-CM | POA: Diagnosis not present

## 2024-06-28 DIAGNOSIS — S46012D Strain of muscle(s) and tendon(s) of the rotator cuff of left shoulder, subsequent encounter: Secondary | ICD-10-CM | POA: Diagnosis not present

## 2024-06-28 DIAGNOSIS — M25612 Stiffness of left shoulder, not elsewhere classified: Secondary | ICD-10-CM | POA: Diagnosis not present

## 2024-06-28 DIAGNOSIS — M6281 Muscle weakness (generalized): Secondary | ICD-10-CM | POA: Diagnosis not present

## 2024-07-01 DIAGNOSIS — M7522 Bicipital tendinitis, left shoulder: Secondary | ICD-10-CM | POA: Diagnosis not present

## 2024-07-02 DIAGNOSIS — M7522 Bicipital tendinitis, left shoulder: Secondary | ICD-10-CM | POA: Diagnosis not present

## 2024-07-02 DIAGNOSIS — M6281 Muscle weakness (generalized): Secondary | ICD-10-CM | POA: Diagnosis not present

## 2024-07-02 DIAGNOSIS — S46012D Strain of muscle(s) and tendon(s) of the rotator cuff of left shoulder, subsequent encounter: Secondary | ICD-10-CM | POA: Diagnosis not present

## 2024-07-02 DIAGNOSIS — M25612 Stiffness of left shoulder, not elsewhere classified: Secondary | ICD-10-CM | POA: Diagnosis not present

## 2024-07-03 ENCOUNTER — Ambulatory Visit
Admission: RE | Admit: 2024-07-03 | Discharge: 2024-07-03 | Disposition: A | Discharge status: Home / Self Care | Source: Ambulatory Visit | Attending: Family Medicine | Admitting: Family Medicine

## 2024-07-03 ENCOUNTER — Ambulatory Visit

## 2024-07-03 ENCOUNTER — Other Ambulatory Visit: Payer: Self-pay | Admitting: Family Medicine

## 2024-07-03 DIAGNOSIS — N644 Mastodynia: Secondary | ICD-10-CM

## 2024-07-03 DIAGNOSIS — N63 Unspecified lump in unspecified breast: Secondary | ICD-10-CM

## 2024-07-03 DIAGNOSIS — Z1231 Encounter for screening mammogram for malignant neoplasm of breast: Secondary | ICD-10-CM | POA: Diagnosis not present

## 2024-07-04 DIAGNOSIS — M6281 Muscle weakness (generalized): Secondary | ICD-10-CM | POA: Diagnosis not present

## 2024-07-04 DIAGNOSIS — M7522 Bicipital tendinitis, left shoulder: Secondary | ICD-10-CM | POA: Diagnosis not present

## 2024-07-04 DIAGNOSIS — S46012D Strain of muscle(s) and tendon(s) of the rotator cuff of left shoulder, subsequent encounter: Secondary | ICD-10-CM | POA: Diagnosis not present

## 2024-07-04 DIAGNOSIS — M25612 Stiffness of left shoulder, not elsewhere classified: Secondary | ICD-10-CM | POA: Diagnosis not present

## 2024-07-09 ENCOUNTER — Other Ambulatory Visit (HOSPITAL_COMMUNITY): Payer: Self-pay | Admitting: Internal Medicine

## 2024-07-11 DIAGNOSIS — M25612 Stiffness of left shoulder, not elsewhere classified: Secondary | ICD-10-CM | POA: Diagnosis not present

## 2024-07-11 DIAGNOSIS — M6281 Muscle weakness (generalized): Secondary | ICD-10-CM | POA: Diagnosis not present

## 2024-07-11 DIAGNOSIS — S46012D Strain of muscle(s) and tendon(s) of the rotator cuff of left shoulder, subsequent encounter: Secondary | ICD-10-CM | POA: Diagnosis not present

## 2024-07-11 DIAGNOSIS — M7522 Bicipital tendinitis, left shoulder: Secondary | ICD-10-CM | POA: Diagnosis not present

## 2024-07-15 DIAGNOSIS — M25612 Stiffness of left shoulder, not elsewhere classified: Secondary | ICD-10-CM | POA: Diagnosis not present

## 2024-07-15 DIAGNOSIS — M7522 Bicipital tendinitis, left shoulder: Secondary | ICD-10-CM | POA: Diagnosis not present

## 2024-07-15 DIAGNOSIS — S46012D Strain of muscle(s) and tendon(s) of the rotator cuff of left shoulder, subsequent encounter: Secondary | ICD-10-CM | POA: Diagnosis not present

## 2024-07-15 DIAGNOSIS — M6281 Muscle weakness (generalized): Secondary | ICD-10-CM | POA: Diagnosis not present

## 2024-07-17 DIAGNOSIS — M6281 Muscle weakness (generalized): Secondary | ICD-10-CM | POA: Diagnosis not present

## 2024-07-17 DIAGNOSIS — M25612 Stiffness of left shoulder, not elsewhere classified: Secondary | ICD-10-CM | POA: Diagnosis not present

## 2024-07-17 DIAGNOSIS — S46012D Strain of muscle(s) and tendon(s) of the rotator cuff of left shoulder, subsequent encounter: Secondary | ICD-10-CM | POA: Diagnosis not present

## 2024-07-17 DIAGNOSIS — M7522 Bicipital tendinitis, left shoulder: Secondary | ICD-10-CM | POA: Diagnosis not present

## 2024-07-21 ENCOUNTER — Other Ambulatory Visit (HOSPITAL_COMMUNITY): Payer: Self-pay | Admitting: Internal Medicine

## 2024-07-22 DIAGNOSIS — M25612 Stiffness of left shoulder, not elsewhere classified: Secondary | ICD-10-CM | POA: Diagnosis not present

## 2024-07-22 DIAGNOSIS — M7522 Bicipital tendinitis, left shoulder: Secondary | ICD-10-CM | POA: Diagnosis not present

## 2024-07-22 DIAGNOSIS — M6281 Muscle weakness (generalized): Secondary | ICD-10-CM | POA: Diagnosis not present

## 2024-07-22 DIAGNOSIS — S46012D Strain of muscle(s) and tendon(s) of the rotator cuff of left shoulder, subsequent encounter: Secondary | ICD-10-CM | POA: Diagnosis not present

## 2024-07-31 DIAGNOSIS — M25612 Stiffness of left shoulder, not elsewhere classified: Secondary | ICD-10-CM | POA: Diagnosis not present

## 2024-07-31 DIAGNOSIS — M6281 Muscle weakness (generalized): Secondary | ICD-10-CM | POA: Diagnosis not present

## 2024-07-31 DIAGNOSIS — S46012D Strain of muscle(s) and tendon(s) of the rotator cuff of left shoulder, subsequent encounter: Secondary | ICD-10-CM | POA: Diagnosis not present

## 2024-07-31 DIAGNOSIS — M7522 Bicipital tendinitis, left shoulder: Secondary | ICD-10-CM | POA: Diagnosis not present

## 2024-08-07 DIAGNOSIS — S46012D Strain of muscle(s) and tendon(s) of the rotator cuff of left shoulder, subsequent encounter: Secondary | ICD-10-CM | POA: Diagnosis not present

## 2024-08-07 DIAGNOSIS — M6281 Muscle weakness (generalized): Secondary | ICD-10-CM | POA: Diagnosis not present

## 2024-08-07 DIAGNOSIS — M25612 Stiffness of left shoulder, not elsewhere classified: Secondary | ICD-10-CM | POA: Diagnosis not present

## 2024-08-07 DIAGNOSIS — M7522 Bicipital tendinitis, left shoulder: Secondary | ICD-10-CM | POA: Diagnosis not present

## 2024-08-14 DIAGNOSIS — S46012D Strain of muscle(s) and tendon(s) of the rotator cuff of left shoulder, subsequent encounter: Secondary | ICD-10-CM | POA: Diagnosis not present

## 2024-08-14 DIAGNOSIS — M6281 Muscle weakness (generalized): Secondary | ICD-10-CM | POA: Diagnosis not present

## 2024-08-14 DIAGNOSIS — M25612 Stiffness of left shoulder, not elsewhere classified: Secondary | ICD-10-CM | POA: Diagnosis not present

## 2024-08-14 DIAGNOSIS — M7522 Bicipital tendinitis, left shoulder: Secondary | ICD-10-CM | POA: Diagnosis not present

## 2024-08-19 DIAGNOSIS — E1165 Type 2 diabetes mellitus with hyperglycemia: Secondary | ICD-10-CM | POA: Diagnosis not present

## 2024-08-19 DIAGNOSIS — Z23 Encounter for immunization: Secondary | ICD-10-CM | POA: Diagnosis not present

## 2024-08-19 DIAGNOSIS — Z9641 Presence of insulin pump (external) (internal): Secondary | ICD-10-CM | POA: Diagnosis not present

## 2024-08-19 DIAGNOSIS — E7800 Pure hypercholesterolemia, unspecified: Secondary | ICD-10-CM | POA: Diagnosis not present

## 2024-08-19 DIAGNOSIS — I10 Essential (primary) hypertension: Secondary | ICD-10-CM | POA: Diagnosis not present

## 2024-08-21 DIAGNOSIS — M7522 Bicipital tendinitis, left shoulder: Secondary | ICD-10-CM | POA: Diagnosis not present

## 2024-08-21 DIAGNOSIS — M25612 Stiffness of left shoulder, not elsewhere classified: Secondary | ICD-10-CM | POA: Diagnosis not present

## 2024-08-21 DIAGNOSIS — S46012D Strain of muscle(s) and tendon(s) of the rotator cuff of left shoulder, subsequent encounter: Secondary | ICD-10-CM | POA: Diagnosis not present

## 2024-08-21 DIAGNOSIS — M6281 Muscle weakness (generalized): Secondary | ICD-10-CM | POA: Diagnosis not present

## 2024-08-26 DIAGNOSIS — M6281 Muscle weakness (generalized): Secondary | ICD-10-CM | POA: Diagnosis not present

## 2024-08-26 DIAGNOSIS — M7522 Bicipital tendinitis, left shoulder: Secondary | ICD-10-CM | POA: Diagnosis not present

## 2024-08-26 DIAGNOSIS — S46012D Strain of muscle(s) and tendon(s) of the rotator cuff of left shoulder, subsequent encounter: Secondary | ICD-10-CM | POA: Diagnosis not present

## 2024-08-26 DIAGNOSIS — M25612 Stiffness of left shoulder, not elsewhere classified: Secondary | ICD-10-CM | POA: Diagnosis not present

## 2024-08-28 DIAGNOSIS — R109 Unspecified abdominal pain: Secondary | ICD-10-CM | POA: Diagnosis not present

## 2024-08-28 DIAGNOSIS — R112 Nausea with vomiting, unspecified: Secondary | ICD-10-CM | POA: Diagnosis not present

## 2024-08-28 DIAGNOSIS — R1033 Periumbilical pain: Secondary | ICD-10-CM | POA: Diagnosis not present

## 2024-08-28 DIAGNOSIS — K5900 Constipation, unspecified: Secondary | ICD-10-CM | POA: Diagnosis not present

## 2024-09-20 DIAGNOSIS — M7522 Bicipital tendinitis, left shoulder: Secondary | ICD-10-CM | POA: Diagnosis not present

## 2024-09-20 DIAGNOSIS — Z9889 Other specified postprocedural states: Secondary | ICD-10-CM | POA: Diagnosis not present

## 2024-09-27 DIAGNOSIS — K2930 Chronic superficial gastritis without bleeding: Secondary | ICD-10-CM | POA: Diagnosis not present

## 2024-09-27 DIAGNOSIS — B3781 Candidal esophagitis: Secondary | ICD-10-CM | POA: Diagnosis not present

## 2024-09-27 DIAGNOSIS — R194 Change in bowel habit: Secondary | ICD-10-CM | POA: Diagnosis not present

## 2024-09-27 DIAGNOSIS — R159 Full incontinence of feces: Secondary | ICD-10-CM | POA: Diagnosis not present

## 2024-09-27 DIAGNOSIS — K2970 Gastritis, unspecified, without bleeding: Secondary | ICD-10-CM | POA: Diagnosis not present

## 2024-09-27 DIAGNOSIS — K229 Disease of esophagus, unspecified: Secondary | ICD-10-CM | POA: Diagnosis not present

## 2024-09-27 DIAGNOSIS — R112 Nausea with vomiting, unspecified: Secondary | ICD-10-CM | POA: Diagnosis not present

## 2024-09-27 DIAGNOSIS — R1084 Generalized abdominal pain: Secondary | ICD-10-CM | POA: Diagnosis not present

## 2024-09-27 DIAGNOSIS — R1013 Epigastric pain: Secondary | ICD-10-CM | POA: Diagnosis not present

## 2024-10-10 ENCOUNTER — Other Ambulatory Visit (HOSPITAL_COMMUNITY): Payer: Self-pay | Admitting: Internal Medicine

## 2024-10-17 ENCOUNTER — Other Ambulatory Visit (HOSPITAL_COMMUNITY): Payer: Self-pay | Admitting: Internal Medicine
